# Patient Record
Sex: Female | Born: 1960 | Race: White | Hispanic: No | Marital: Single | Smoking: Former smoker
Health system: Southern US, Community
[De-identification: ages and names within clinical notes are randomized; demographics above are authoritative.]

## PROBLEM LIST (undated history)

## (undated) DIAGNOSIS — F32A Depression, unspecified: Secondary | ICD-10-CM

## (undated) DIAGNOSIS — F419 Anxiety disorder, unspecified: Secondary | ICD-10-CM

## (undated) DIAGNOSIS — A4902 Methicillin resistant Staphylococcus aureus infection, unspecified site: Secondary | ICD-10-CM

## (undated) DIAGNOSIS — K219 Gastro-esophageal reflux disease without esophagitis: Secondary | ICD-10-CM

## (undated) DIAGNOSIS — I2510 Atherosclerotic heart disease of native coronary artery without angina pectoris: Secondary | ICD-10-CM

## (undated) DIAGNOSIS — C539 Malignant neoplasm of cervix uteri, unspecified: Secondary | ICD-10-CM

## (undated) DIAGNOSIS — I214 Non-ST elevation (NSTEMI) myocardial infarction: Secondary | ICD-10-CM

## (undated) DIAGNOSIS — J189 Pneumonia, unspecified organism: Secondary | ICD-10-CM

## (undated) DIAGNOSIS — R519 Headache, unspecified: Secondary | ICD-10-CM

## (undated) DIAGNOSIS — I219 Acute myocardial infarction, unspecified: Secondary | ICD-10-CM

## (undated) DIAGNOSIS — IMO0002 Reserved for concepts with insufficient information to code with codable children: Secondary | ICD-10-CM

## (undated) DIAGNOSIS — R011 Cardiac murmur, unspecified: Secondary | ICD-10-CM

## (undated) HISTORY — PX: APPENDECTOMY: SHX54

## (undated) HISTORY — PX: TUBAL LIGATION: SHX77

## (undated) HISTORY — PX: WISDOM TOOTH EXTRACTION: SHX21

## (undated) HISTORY — PX: CERVICAL BIOPSY: SHX590

## (undated) HISTORY — DX: Atherosclerotic heart disease of native coronary artery without angina pectoris: I25.10

---

## 2001-06-24 DIAGNOSIS — I2510 Atherosclerotic heart disease of native coronary artery without angina pectoris: Secondary | ICD-10-CM | POA: Diagnosis present

## 2001-09-05 ENCOUNTER — Inpatient Hospital Stay (HOSPITAL_COMMUNITY): Admission: EM | Admit: 2001-09-05 | Discharge: 2001-09-09 | Payer: Self-pay | Admitting: Psychiatry

## 2011-06-24 ENCOUNTER — Inpatient Hospital Stay (HOSPITAL_COMMUNITY)
Admission: EM | Admit: 2011-06-24 | Discharge: 2011-06-26 | DRG: 247 | Disposition: A | Discharge status: Home / Self Care | Payer: 59 | Attending: Cardiology | Admitting: Cardiology

## 2011-06-24 ENCOUNTER — Encounter (HOSPITAL_COMMUNITY): Payer: Self-pay | Admitting: Emergency Medicine

## 2011-06-24 ENCOUNTER — Ambulatory Visit (HOSPITAL_COMMUNITY): Admit: 2011-06-24 | Payer: Self-pay | Admitting: Interventional Cardiology

## 2011-06-24 ENCOUNTER — Encounter (HOSPITAL_COMMUNITY)
Admission: EM | Disposition: A | Discharge status: Home / Self Care | Payer: Self-pay | Source: Home / Self Care | Attending: Cardiology

## 2011-06-24 ENCOUNTER — Emergency Department (HOSPITAL_COMMUNITY): Payer: Self-pay

## 2011-06-24 DIAGNOSIS — Z9089 Acquired absence of other organs: Secondary | ICD-10-CM

## 2011-06-24 DIAGNOSIS — Z90710 Acquired absence of both cervix and uterus: Secondary | ICD-10-CM

## 2011-06-24 DIAGNOSIS — I2510 Atherosclerotic heart disease of native coronary artery without angina pectoris: Secondary | ICD-10-CM | POA: Diagnosis present

## 2011-06-24 DIAGNOSIS — I214 Non-ST elevation (NSTEMI) myocardial infarction: Principal | ICD-10-CM | POA: Diagnosis present

## 2011-06-24 DIAGNOSIS — Z7982 Long term (current) use of aspirin: Secondary | ICD-10-CM

## 2011-06-24 DIAGNOSIS — I252 Old myocardial infarction: Secondary | ICD-10-CM

## 2011-06-24 DIAGNOSIS — Z8541 Personal history of malignant neoplasm of cervix uteri: Secondary | ICD-10-CM

## 2011-06-24 DIAGNOSIS — Z79899 Other long term (current) drug therapy: Secondary | ICD-10-CM

## 2011-06-24 DIAGNOSIS — Z9851 Tubal ligation status: Secondary | ICD-10-CM

## 2011-06-24 DIAGNOSIS — E785 Hyperlipidemia, unspecified: Secondary | ICD-10-CM | POA: Diagnosis present

## 2011-06-24 DIAGNOSIS — Z955 Presence of coronary angioplasty implant and graft: Secondary | ICD-10-CM

## 2011-06-24 HISTORY — DX: Reserved for concepts with insufficient information to code with codable children: IMO0002

## 2011-06-24 HISTORY — DX: Non-ST elevation (NSTEMI) myocardial infarction: I21.4

## 2011-06-24 HISTORY — PX: LEFT HEART CATHETERIZATION WITH CORONARY ANGIOGRAM: SHX5451

## 2011-06-24 HISTORY — PX: PERCUTANEOUS CORONARY STENT INTERVENTION (PCI-S): SHX5485

## 2011-06-24 HISTORY — DX: Malignant neoplasm of cervix uteri, unspecified: C53.9

## 2011-06-24 HISTORY — DX: Acute myocardial infarction, unspecified: I21.9

## 2011-06-24 LAB — CARDIAC PANEL(CRET KIN+CKTOT+MB+TROPI)
CK, MB: 25.4 ng/mL (ref 0.3–4.0)
CK, MB: 30 ng/mL (ref 0.3–4.0)
Relative Index: 11.7 — ABNORMAL HIGH (ref 0.0–2.5)
Relative Index: 12.2 — ABNORMAL HIGH (ref 0.0–2.5)
Total CK: 218 U/L — ABNORMAL HIGH (ref 7–177)
Total CK: 245 U/L — ABNORMAL HIGH (ref 7–177)
Troponin I: 2.95 ng/mL (ref <0.30)
Troponin I: 5.79 ng/mL (ref <0.30)

## 2011-06-24 LAB — CBC
HCT: 39.6 % (ref 36.0–46.0)
Hemoglobin: 13.9 g/dL (ref 12.0–15.0)
MCH: 31.4 pg (ref 26.0–34.0)
MCHC: 35.1 g/dL (ref 30.0–36.0)
MCV: 89.4 fL (ref 78.0–100.0)
Platelets: 199 10*3/uL (ref 150–400)
RBC: 4.43 MIL/uL (ref 3.87–5.11)
RDW: 13.7 % (ref 11.5–15.5)
WBC: 4.4 10*3/uL (ref 4.0–10.5)

## 2011-06-24 LAB — PROTIME-INR
INR: 1 (ref 0.00–1.49)
Prothrombin Time: 13.4 seconds (ref 11.6–15.2)

## 2011-06-24 LAB — HEMOGLOBIN A1C
Hgb A1c MFr Bld: 5 % (ref <5.7)
Mean Plasma Glucose: 97 mg/dL (ref <117)

## 2011-06-24 LAB — BASIC METABOLIC PANEL
BUN: 14 mg/dL (ref 6–23)
CO2: 22 mEq/L (ref 19–32)
Calcium: 9.4 mg/dL (ref 8.4–10.5)
Chloride: 105 mEq/L (ref 96–112)
Creatinine, Ser: 0.71 mg/dL (ref 0.50–1.10)
GFR calc Af Amer: >90 mL/min (ref >90)
GFR calc non Af Amer: >90 mL/min (ref >90)
Glucose, Bld: 93 mg/dL (ref 70–99)
Potassium: 3.6 mEq/L (ref 3.5–5.1)
Sodium: 138 mEq/L (ref 135–145)

## 2011-06-24 LAB — MRSA PCR SCREENING: MRSA by PCR: NEGATIVE

## 2011-06-24 LAB — TROPONIN I
Troponin I: <0.3 ng/mL (ref <0.30)
Troponin I: 0.57 ng/mL (ref <0.30)

## 2011-06-24 SURGERY — PERCUTANEOUS CORONARY STENT INTERVENTION (PCI-S)
Laterality: Right

## 2011-06-24 MED ORDER — HEPARIN (PORCINE) IN NACL 2-0.9 UNIT/ML-% IJ SOLN
INTRAMUSCULAR | Status: AC
  Filled 2011-06-24: qty 2000

## 2011-06-24 MED ORDER — METOPROLOL TARTRATE 1 MG/ML IV SOLN
5.0000 mg | Freq: Once | INTRAVENOUS | Status: AC
  Administered 2011-06-24: 5 mg via INTRAVENOUS

## 2011-06-24 MED ORDER — SODIUM CHLORIDE 0.9 % IV SOLN: 250.0000 mL | INTRAVENOUS | Status: DC | PRN

## 2011-06-24 MED ORDER — MIDAZOLAM HCL 2 MG/2ML IJ SOLN
INTRAMUSCULAR | Status: AC
  Filled 2011-06-24: qty 2

## 2011-06-24 MED ORDER — HEPARIN BOLUS VIA INFUSION
4000.0000 [IU] | Freq: Once | INTRAVENOUS | Status: AC
  Administered 2011-06-24: 4000 [IU] via INTRAVENOUS

## 2011-06-24 MED ORDER — ATORVASTATIN CALCIUM 80 MG PO TABS
80.0000 mg | ORAL_TABLET | Freq: Every day | ORAL | Status: DC
  Administered 2011-06-24 – 2011-06-25 (×2): 80 mg via ORAL
  Filled 2011-06-24 (×3): qty 1

## 2011-06-24 MED ORDER — BIVALIRUDIN 250 MG IV SOLR
INTRAVENOUS | Status: AC
  Filled 2011-06-24: qty 250

## 2011-06-24 MED ORDER — HEPARIN (PORCINE) IN NACL 100-0.45 UNIT/ML-% IJ SOLN
950.0000 [IU]/h | INTRAMUSCULAR | Status: DC
  Administered 2011-06-24: 950 [IU]/h via INTRAVENOUS
  Filled 2011-06-24: qty 250

## 2011-06-24 MED ORDER — TICAGRELOR 90 MG PO TABS
ORAL_TABLET | ORAL | Status: AC
  Administered 2011-06-24: 90 mg via ORAL
  Filled 2011-06-24: qty 2

## 2011-06-24 MED ORDER — ONDANSETRON HCL 4 MG/2ML IJ SOLN
4.0000 mg | Freq: Once | INTRAMUSCULAR | Status: AC
  Administered 2011-06-24: 4 mg via INTRAVENOUS
  Filled 2011-06-24: qty 2

## 2011-06-24 MED ORDER — SODIUM CHLORIDE 0.9 % IJ SOLN: 3.0000 mL | INTRAMUSCULAR | Status: DC | PRN

## 2011-06-24 MED ORDER — FENTANYL CITRATE 0.05 MG/ML IJ SOLN
INTRAMUSCULAR | Status: AC
  Filled 2011-06-24: qty 2

## 2011-06-24 MED ORDER — LIDOCAINE HCL (PF) 1 % IJ SOLN
INTRAMUSCULAR | Status: AC
  Filled 2011-06-24: qty 30

## 2011-06-24 MED ORDER — HYDROMORPHONE HCL PF 1 MG/ML IJ SOLN
1.0000 mg | Freq: Once | INTRAMUSCULAR | Status: AC
  Administered 2011-06-24: 1 mg via INTRAVENOUS

## 2011-06-24 MED ORDER — NITROGLYCERIN 0.2 MG/ML ON CALL CATH LAB
INTRAVENOUS | Status: AC
  Filled 2011-06-24: qty 1

## 2011-06-24 MED ORDER — ACETAMINOPHEN 325 MG PO TABS
650.0000 mg | ORAL_TABLET | ORAL | Status: DC | PRN
  Administered 2011-06-25: 650 mg via ORAL
  Filled 2011-06-24: qty 2

## 2011-06-24 MED ORDER — ASPIRIN 81 MG PO CHEW
CHEWABLE_TABLET | ORAL | Status: AC
  Filled 2011-06-24: qty 4

## 2011-06-24 MED ORDER — NITROGLYCERIN IN D5W 200-5 MCG/ML-% IV SOLN
2.0000 ug/min | Freq: Once | INTRAVENOUS | Status: AC
  Administered 2011-06-24: 5 ug/min via INTRAVENOUS

## 2011-06-24 MED ORDER — METOPROLOL TARTRATE 1 MG/ML IV SOLN
INTRAVENOUS | Status: AC
  Filled 2011-06-24: qty 5

## 2011-06-24 MED ORDER — ACETAMINOPHEN 325 MG PO TABS: 650.0000 mg | ORAL_TABLET | ORAL | Status: DC | PRN

## 2011-06-24 MED ORDER — HYDROMORPHONE HCL PF 1 MG/ML IJ SOLN
INTRAMUSCULAR | Status: AC
  Administered 2011-06-24: 1 mg
  Filled 2011-06-24: qty 1

## 2011-06-24 MED ORDER — ONDANSETRON HCL 4 MG/2ML IJ SOLN: 4.0000 mg | Freq: Four times a day (QID) | INTRAMUSCULAR | Status: DC | PRN

## 2011-06-24 MED ORDER — POTASSIUM CHLORIDE 10 MEQ/100ML IV SOLN: 10.0000 meq | INTRAVENOUS | Status: DC

## 2011-06-24 MED ORDER — ASPIRIN EC 81 MG PO TBEC
81.0000 mg | DELAYED_RELEASE_TABLET | Freq: Every day | ORAL | Status: DC
  Administered 2011-06-25 – 2011-06-26 (×2): 81 mg via ORAL
  Filled 2011-06-24 (×2): qty 1

## 2011-06-24 MED ORDER — SODIUM CHLORIDE 0.9 % IJ SOLN
3.0000 mL | Freq: Two times a day (BID) | INTRAMUSCULAR | Status: DC
  Administered 2011-06-24 – 2011-06-26 (×3): 3 mL via INTRAVENOUS

## 2011-06-24 MED ORDER — MORPHINE SULFATE 4 MG/ML IJ SOLN
4.0000 mg | Freq: Once | INTRAMUSCULAR | Status: AC
  Administered 2011-06-24: 4 mg via INTRAVENOUS
  Filled 2011-06-24: qty 1

## 2011-06-24 MED ORDER — ZOLPIDEM TARTRATE 5 MG PO TABS
10.0000 mg | ORAL_TABLET | Freq: Every evening | ORAL | Status: DC | PRN
  Administered 2011-06-24 – 2011-06-25 (×2): 10 mg via ORAL
  Filled 2011-06-24 (×2): qty 2

## 2011-06-24 MED ORDER — MORPHINE SULFATE 2 MG/ML IJ SOLN: 2.0000 mg | Freq: Once | INTRAMUSCULAR | Status: AC

## 2011-06-24 MED ORDER — MORPHINE SULFATE 2 MG/ML IJ SOLN
INTRAMUSCULAR | Status: AC
  Administered 2011-06-24: 2 mg via INTRAVENOUS
  Filled 2011-06-24: qty 1

## 2011-06-24 MED ORDER — ASPIRIN 81 MG PO CHEW: 81.0000 mg | CHEWABLE_TABLET | Freq: Every day | ORAL | Status: DC

## 2011-06-24 MED ORDER — METOPROLOL TARTRATE 12.5 MG HALF TABLET
12.5000 mg | ORAL_TABLET | Freq: Two times a day (BID) | ORAL | Status: DC
  Administered 2011-06-24 – 2011-06-25 (×3): 12.5 mg via ORAL
  Filled 2011-06-24 (×4): qty 1

## 2011-06-24 MED ORDER — TICAGRELOR 90 MG PO TABS
90.0000 mg | ORAL_TABLET | Freq: Two times a day (BID) | ORAL | Status: DC
  Administered 2011-06-24 – 2011-06-26 (×4): 90 mg via ORAL
  Filled 2011-06-24 (×5): qty 1

## 2011-06-24 MED ORDER — ONDANSETRON HCL 4 MG/2ML IJ SOLN
4.0000 mg | Freq: Four times a day (QID) | INTRAMUSCULAR | Status: DC | PRN
  Administered 2011-06-24: 4 mg via INTRAVENOUS
  Filled 2011-06-24: qty 2

## 2011-06-24 MED ORDER — NITROGLYCERIN 0.4 MG SL SUBL
0.4000 mg | SUBLINGUAL_TABLET | SUBLINGUAL | Status: DC | PRN
  Administered 2011-06-24 (×2): 0.4 mg via SUBLINGUAL
  Filled 2011-06-24 (×2): qty 25

## 2011-06-24 MED ORDER — SODIUM CHLORIDE 0.9 % IV SOLN
1.0000 mL/kg/h | INTRAVENOUS | Status: AC
  Administered 2011-06-24: 1 mL/kg/h via INTRAVENOUS

## 2011-06-24 NOTE — ED Notes (Signed)
Lab bedside to obtain sample

## 2011-06-24 NOTE — ED Provider Notes (Signed)
History     CSN: [id number]  Arrival date & time 06/24/11  0235   First MD Initiated Contact with Patient 06/24/11 [phone]      Chief Complaint  Patient presents with  . Chest Pain    (Consider location/radiation/quality/duration/timing/severity/associated sxs/prior treatment) HPI The patient is a 51 year old female who presents today complaining of intermittent episodes of substernal chest pressure radiating to her neck and shoulders bilaterally that began 24 hours ago. Patient was sitting still and this began. This is similar to when she had a prior heart attack per her report. Patient reports having a myocardial infarction 10 years ago. No stenting was performed at that time. Patient had some old nitroglycerin at home and took this prior to arrival. She had slight improvement in her symptoms. Patient also took full-strength aspirin at 2 PM today prior to arrival. Patient denies any history of DVT or PE personally. She has no history of hypertension, hypercholesterolemia, or diabetes. Patient has not a smoker. She does have strong family history with a mother with several early heart attacks. Patient has not had a stress test or any further evaluation by a cardiologist since her prior MI. Patient does not currently have a very care physician. She denies any nausea or diaphoresis. Patient has no abdominal pain, cough, fevers, or other associated symptoms. There are no other associated or modifying factors. Past Medical History  Diagnosis Date  . Myocardial infarction   . High cholesterol   . Degenerative disc disease   . Cancer   . Cervical cancer     Past Surgical History  Procedure Date  . Appendectomy   . Tubal ligation     Family History  Problem Relation Age of Onset  . Coronary artery disease Other   . Hyperlipidemia Other   . Diabetes Other   . Cancer Other     History  Substance Use Topics  . Smoking status: Never Smoker   . Smokeless tobacco: Not on file  . Alcohol  Use: Yes    OB History    Grav Para Term Preterm Abortions TAB SAB Ect Mult Living                  Review of Systems  Constitutional: Negative.   HENT: Negative.   Eyes: Negative.   Respiratory: Negative.   Cardiovascular: Positive for chest pain.  Gastrointestinal: Negative.   Genitourinary: Negative.   Musculoskeletal: Negative.   Skin: Negative.   Neurological: Negative.   Hematological: Negative.   Psychiatric/Behavioral: Negative.   All other systems reviewed and are negative.    Allergies  Review of patient's allergies indicates no known allergies.  Home Medications   Current Outpatient Rx  Name Route Sig Dispense Refill  . ASPIRIN 325 MG PO TABS Oral Take 325 mg by mouth once.    Marland Kitchen FEXOFENADINE HCL 180 MG PO TABS Oral Take 180 mg by mouth daily.    . IBUPROFEN 200 MG PO TABS Oral Take 800 mg by mouth every 6 (six) hours as needed. For pain    . NITROGLYCERIN 0.4 MG SL SUBL Sublingual Place 0.4 mg under the tongue every 5 (five) minutes as needed.    Marland Kitchen OVER THE COUNTER MEDICATION  2 tablets. Herbal supplement for menopause    . OVER THE COUNTER MEDICATION Oral Take 2 tablets by mouth daily. Herbal supplement for nerves    . ST JOHNS WORT PO Oral Take 2 tablets by mouth daily.      BP 113/79  Pulse 77  Temp(Src) 98.4 F (36.9 C) (Oral)  Resp 12  Wt 170 lb (77.111 kg)  SpO2 99%  LMP 06/03/2011  Physical Exam  Nursing note and vitals reviewed. GEN: Well-developed, well-nourished female in no distress HEENT: Atraumatic, normocephalic. Oropharynx clear without erythema EYES: PERRLA BL, no scleral icterus. NECK: Trachea midline, no meningismus CV: regular rate and rhythm. No murmurs, rubs, or gallops PULM: No respiratory distress.  No crackles, wheezes, or rales. GI: soft, non-tender. No guarding, rebound, or tenderness. + bowel sounds  GU: deferred Neuro: cranial nerves 2-12 intact, no abnormalities of strength or sensation, A and O x 3 MSK: Patient  moves all 4 extremities symmetrically, no deformity, edema, or injury noted Skin: No rashes petechiae, purpura, or jaundice Psych: no abnormality of mood   ED Course  Procedures (including critical care time)   Date: 06/24/2011  Rate: 91  Rhythm: normal sinus rhythm  QRS Axis: normal  Intervals: normal  ST/T Wave abnormalities: normal  Conduction Disutrbances:none  Narrative Interpretation:   Old EKG Reviewed: none available   Date: 06/24/2011 #2  Rate: 77  Rhythm: normal sinus rhythm  QRS Axis: normal  Intervals: normal  ST/T Wave abnormalities: normal  Conduction Disutrbances:none  Narrative Interpretation:   Old EKG Reviewed: unchanged   Date: 06/24/2011  Rate: 96  Rhythm: normal sinus rhythm  QRS Axis: normal  Intervals: normal  ST/T Wave abnormalities: normal  Conduction Disutrbances:none  Narrative Interpretation:   Old EKG Reviewed: unchanged    Labs Reviewed  TROPONIN I - Abnormal; Notable for the following:    Troponin I 0.57 (*)    All other components within normal limits  CBC  BASIC METABOLIC PANEL  TROPONIN I   Dg Chest 2 View  06/24/2011  *RADIOLOGY REPORT*  Clinical Data: Chest pain  CHEST - 2 VIEW  Comparison: None.  Findings: Lungs are clear. No pleural effusion or pneumothorax. The cardiomediastinal contours are within normal limits. The visualized bones and soft tissues are without significant appreciable abnormality.  IMPRESSION: No radiographic evidence for acute cardiopulmonary process.  Original Report Authenticated By: Waneta Martins, M.D.     1. NSTEMI, initial episode of care       MDM  Patient was evaluated by myself. She received nitroglycerin for her pain and had some improvement. Patient also received morphine which helped as well. Patient had initial EKG with no acute ischemic changes. This was repeated after a possible run of 7 beats of V. tach was witnessed by nursing. EKG was unchanged. Laboratory workup was unremarkable  including negative troponin. At 3 hours repeat troponin was 0.57. EKG was repeated again. There were still no changes noted. Order was placed for nitro drip as well as heparin. Consult was placed to cardiology.  Patient was discussed with Dr. Dietrich Pates. She'll be evaluated by cardiology here for further management.        Cyndra Numbers, MD 06/24/11 [phone]

## 2011-06-24 NOTE — Progress Notes (Signed)
ANTICOAGULATION CONSULT NOTE - Initial Consult  Pharmacy Consult for IV Heparin Indication: chest pain/ACS  No Known Allergies  Patient Measurements: Height: 5\' 2"  (157.5 cm) Weight: 169 lb 15.6 oz (77.1 kg) IBW/kg (Calculated) : 50.1    Vital Signs: Temp: 98.4 F (36.9 C) (05/18 0239) Temp src: Oral (05/18 0239) BP: 121/79 mmHg (05/18 0700) Pulse Rate: 78  (05/18 0700)  Labs:  Basename 06/24/11 0613 06/24/11 0335  HGB -- 13.9  HCT -- 39.6  PLT -- 199  APTT -- --  LABPROT -- --  INR -- --  HEPARINUNFRC -- --  CREATININE -- 0.71  CKTOTAL -- --  CKMB -- --  TROPONINI 0.57* <0.30    Estimated Creatinine Clearance: 82.7 ml/min (by C-G formula based on Cr of 0.71).   Medical History: Past Medical History  Diagnosis Date  . Myocardial infarction   . High cholesterol   . Degenerative disc disease   . Cancer   . Cervical cancer     Medications:  Scheduled:    . heparin  4,000 Units Intravenous Once  .  morphine injection  4 mg Intravenous Once  . nitroGLYCERIN  2-200 mcg/min Intravenous Once  . ondansetron  4 mg Intravenous Once   Infusions:    . heparin     PRN: nitroGLYCERIN  Assessment: 51 yo F with hx MI admitted w/ CP and elevated troponin to start IV Heparin for STEMI.   Goal of Therapy:  Heparin level 0.3-0.7 units/ml Monitor platelets by anticoagulation protocol: Yes   Plan:  1.) Draw Baseline PT/INR, after drawn may start IV heparin 2.) Heparin 4000 units IV bolus x 1 now 3.) Heparin 950 units/hr (9.5 ml/hr) 4.) Daily Heparin level/CBC 5.) 1st heparin level at 1400 (6 hr after start of gtt)  Tiwan Schnitker, Loma Messing PharmD 7:38 AM 06/24/2011

## 2011-06-24 NOTE — ED Notes (Signed)
Pt alert, family bedside, resp even unlabored, monitor alarm, pt has 3 sec run of V tach on monitor, states pain now 7/10, MD aware, orders obtained, cont to monitor

## 2011-06-24 NOTE — H&P (Signed)
History and Physical  Patient ID: Maria Bishop MRN: [id number], SOB: Jan 03, 1961 51 y.o. Date of Encounter: 06/24/2011, 8:46 AM  Primary Physician: None Primary Cardiologist: None  Chief Complaint: Chest Pain  HPI: 51 y.o. female w/ PMHx significant for MI 101yrs ago (no PCI) who presented to [facility] on 06/24/11 with complaints of chest pain and was transferred to [facility] with NSTEMI.  She reported having a MI 62yrs ago in [place], [place] where cath showed "occlusion in small vessel in the back of the heart", no stenting. No further cardiac events until yesterday after putting groceries away she had sudden onset of substernal chest pressure radiating to her neck and bilateral shoulders that was intermittent and then woke her from sleep this morning prompting her to go to the ED. The pain is similar to the pain she had prior to MI. She took old prescription for NTG and full strength ASA with some relief of chest pain. Denies tobacco abuse or history of HTN, HLD, or DM. She reports a strong family history of heart disease with a mother who had heart attacks at a young age.  In the ED, EKG revealed NSR without acute ST/T changes. CXR was without acute cardiopulmonary abnormalities. Initial troponin was normal with subsequent one elevated at 0.57 after 7 beats of VTach on telemetry was noted. She received morphine and NTG with some relief in pain. She received IV Lopressor for elevated heart rates. She was placed on IV NTG and IV Heparin and transported to [facility] cath lab where she continued to have chest pain.  Past Medical History  Diagnosis Date  . Myocardial infarction     ~80yrs ago  . Degenerative disc disease   . Cervical cancer      Surgical History:  Procedure  . Appendectomy  . Tubal ligation     Home Meds: Medication Sig  aspirin 325 MG tablet Take 325 mg by mouth once.  fexofenadine (ALLEGRA) 180 MG tablet Take 180 mg by mouth daily.  ibuprofen  (ADVIL,MOTRIN) 200 MG tablet Take 800 mg by mouth every 6 (six) hours as needed. For pain  nitroGLYCERIN (NITROSTAT) 0.4 MG SL tablet Place 0.4 mg under the tongue every 5 (five) minutes as needed.  OVER THE COUNTER MEDICATION 2 tablets. Herbal supplement for menopause  OVER THE COUNTER MEDICATION Take 2 tablets by mouth daily. Herbal supplement for nerves  ST JOHNS WORT PO Take 2 tablets by mouth daily.    Allergies: No Known Allergies  Social History  . Marital Status: Married   Social History Main Topics  . Smoking status: Never Smoker   . Alcohol Use: Yes  . Drug Use: No   Family History  Problem Relation Age of Onset  . Coronary artery disease Mother     MI in her 32s  . Hyperlipidemia Other   . Diabetes Other   . Cancer Other     Review of Systems: General: negative for chills, fever, night sweats or weight changes.  Cardiovascular: (+) chest pain; negative for shortness of breath, dyspnea on exertion, edema, orthopnea, palpitations, paroxysmal nocturnal dyspnea  Dermatological: negative for rash Respiratory: negative for cough or wheezing Urologic: negative for hematuria Abdominal: negative for nausea, vomiting, diarrhea, bright red blood per rectum, melena, or hematemesis Neurologic: negative for visual changes, syncope, or dizziness All other systems reviewed and are otherwise negative except as noted above.  Labs:   Component Value Date   WBC 4.4 06/24/2011   HGB 13.9  06/24/2011   HCT 39.6 06/24/2011   MCV 89.4 06/24/2011   PLT 199 06/24/2011    Lab 06/24/11 0335  NA 138  K 3.6  CL 105  CO2 22  BUN 14  CREATININE 0.71  CALCIUM 9.4  GLUCOSE 93   Basename 06/24/11 0613 06/24/11 0335  TROPONINI 0.57* <0.30      06/24/2011 07:48  Prothrombin Time 13.4  INR 1.00   Radiology/Studies:   06/24/2011 - CXR  Findings: Lungs are clear. No pleural effusion or pneumothorax. The cardiomediastinal contours are within normal limits. The visualized bones and soft  tissues are without significant appreciable abnormality.  IMPRESSION: No radiographic evidence for acute cardiopulmonary process.      EKG: 06/24/11 @ 0237 - NSR 91 bpm TWI V1  Physical Exam: Blood pressure 118/81, pulse 115, temperature 98.4 F (36.9 C), temperature source Oral, resp. rate 18, height 5\' 2"  (1.575 m), weight 169 lb 15.6 oz (77.1 kg), last menstrual period 06/03/2011, SpO2 99.00%. General: Well developed, well nourished, middle aged white female in no acute distress. Head: Normocephalic, atraumatic, sclera non-icteric, nares are without discharge Neck: Supple. Negative for carotid bruits. JVD not elevated. Lungs: Clear bilaterally to auscultation without wheezes, rales, or rhonchi. Breathing is unlabored. Heart: RRR with S1 S2. No murmurs, rubs, or gallops appreciated. Abdomen: Soft, non-tender, non-distended with normoactive bowel sounds. No rebound/guarding. No obvious abdominal masses. Msk:  Strength and tone appear normal for age. Extremities: No edema. No clubbing or cyanosis. Distal pedal pulses are 2+ and equal bilaterally. Neuro: Alert and oriented X 3. Moves all extremities spontaneously. Psych:  Responds to questions appropriately with a normal affect.    ASSESSMENT AND PLAN:  51 y.o. female w/ PMHx significant for MI 40yrs ago (no PCI) who presented to [facility] on 06/24/11 with complaints of chest pain and was transferred to [facility] with NSTEMI.  1. NSTEMI: Patient has h/o MI 46yrs ago (without PCI) and otherwise no cardiac risk factors other than family history premature heart disease. She now presents with ongoing chest pain and positive troponins. Currently undergoing cardiac catheterization. Will place on statin and BB and check lipid panel and A1C for risk stratification. Further plans pending cath results.  Signed, HOPE, JESSICA PA-C 06/24/2011, 8:46 AM  I have taken a history, reviewed medications, allergies, PMH, SH, FH, and  reviewed ROS and examined the patient.  I agree with the assessment and plan.I initiated Code STEMI in [facility] ED secondary to ongoing pain and positive markers. EKG without acute changes so concerned about posterior infarct.  Quasim Doyon C. Daleen Squibb, MD, [facility] Pager:  [phone]

## 2011-06-24 NOTE — ED Notes (Signed)
Pt transferred per [facility] to [facility].

## 2011-06-24 NOTE — ED Notes (Signed)
Pt states she did take a full strength 325mg  ASA pta

## 2011-06-24 NOTE — CV Procedure (Signed)
PROCEDURE:  Left heart catheterization with selective coronary angiography, left ventriculogram.  PCI OM3.  INDICATIONS:  Non-STEMI with refractory chest pain.  Emergency consent.  The risks, benefits, and details of the procedure were explained to the patient.  The patient verbalized understanding and wanted to proceed.  Informed written consent was obtained.  PROCEDURE TECHNIQUE:  After Xylocaine anesthesia a 9F sheath was placed in the right femoral artery with a single anterior needle wall stick.   Left coronary angiography was done using a Judkins L4 guide catheter.  Right coronary angiography was done using a Judkins R4 guide catheter.  Left ventriculography was done using a pigtail catheter.  Intervention was then performed.   CONTRAST:  Total of 200  cc.  COMPLICATIONS:  None.    HEMODYNAMICS:  Aortic pressure was 113/78; LV pressure was 119/2 ; LVEDP 12.  There was no gradient between the left ventricle and aorta.    ANGIOGRAPHIC DATA:   The left main coronary artery is a large vessel which is angiographically normal.  The left anterior descending artery is a large vessel which wraps around the apex.  It appears angiographically normal.  The distal vessel is small in caliber.  There are several very small diagonals which are patent.  The left circumflex artery is is a large codominant vessel which proximally, appears angiographically normal.  There is a medium-sized OM 1 and OM 2 which are widely patent.  The next significant obtuse marginal has a appearance of a ruptured plaque /dissection in the mid to distal portion.  The narrowing extends proximally to the ostium of the vessel.  There is some mild narrowing in the mid circumflex just before this obtuse marginal 3 originates.  There is a small OM for branch which has some mild disease but is patent.  The right coronary artery is a large dominant vessel.  It appears angiographic normal.  The PDA is medium sized.  LEFT  VENTRICULOGRAM:  Left ventricular angiogram was done in the 30 RAO projection and revealed normal left ventricular wall motion and systolic function with an estimated ejection fraction of 55 %.  LVEDP was 12 mmHg.  PCI NARRATIVE:  A CLS 3.5 guiding catheters used to engage the left main.  A pro-water wire was used to cross the long area of disease.  Angiomax used for anticoagulation.  An ACT was used to confirm that the Angiomax is therapeutic.  Initially, a 2.0 x 20 emerge balloon was used to predilate the long area of disease in the OM 3.  A 2.25 x 32 Promus elements stent was advanced to the OM 3 and deployed at 12 atmospheres.  There appeared to be a distal edge dissection.  This was treated with a 2.25 x 8 Promus element stent deployed in an overlapping fashion.  The overlap of the stented area was postdilated with the stent balloon.  Further examination of the angiograms revealed that the initial dissection extended into the mid circumflex.  This is a much larger caliber vessel.  A 3.0 x 16 Promus element stent was then deployed at 14 atmospheres overlapping the proximal edge of the long 2.25 stent.  After stent deployment, the OM 3 increased significantly in size with resolution of the intramural hematoma.  The 3.0 stent balloon was then used to post dilate the entire OM 3 stented area.  A 3.5 x 12 [place] Quantum apex balloon was then used to post dilate the most proximal stent inflated to 16 atmospheres and then to 12  atmospheres.  There is an excellent angiographic result.  There is no residual dissection.  TIMI-3 flow was restored throughout.  IMPRESSIONS:  1. Normal left main coronary artery. 2. Normal left anterior descending artery and its branches. 3. 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  This entire area was stented with overlapping drug-eluting stents.  The distal most area was postdilated to 3.0 m in diameter.  The more  proximal area was postdilated to 3.6 mm in diameter. 4. Normal right coronary artery. 5. Normal left ventricular systolic function.  LVEDP 12 mmHg.  Ejection fraction 55 %.  RECOMMENDATION:  The patient is a long stented area now in her OM 3 and mid circumflex.  She needs to continue dual antiplatelet therapy for at least a year.  After talking to the patient, she reports a similar episode to this about 10 years ago when she was in [place], [place].  She was told that an artery in the back side of her heart towards the bottom had "deteriorated."  It was a small artery and was not fixed with any kind of balloon or stent.  The artery fixed today had a similar appearance in the same location.  We chose to treat this area with stents due to the severe stenosis at a focal dissection flap and another area proximal.  She also had several episodes of recurrent, severe pain.  The concern if medical treatment was used, was whether the dissection would continue to propagate.  Disease, which was likely intramural hematoma, appeared to start in the distal OM 3 and propagated back into the mid circumflex.  There is a large margin of normal vessel that was stented in the mid circumflex to avoid any further propagation of intramural hematoma or dissection flap.  The patient has a significant family history of coronary artery disease.  There was some atherosclerotic material noted on catheter exchanges.  It is very difficult to know whether there was a focus of atherosclerosis that caused the dissection.  Regardless, she needs aggressive secondary prevention including avoiding tobacco, lipid-lowering therapy and beta blockade if blood pressure will allow.

## 2011-06-24 NOTE — ED Notes (Signed)
Pt states pain has unchanged, c/o nausea, skin pwd, resp even unlabored, MD made aware, cont to monitor

## 2011-06-24 NOTE — ED Notes (Signed)
Dr Daleen Squibb at bedside. [facility] called per Dr Daleen Squibb to go straight to cath lab. PT informed and agreeable. Unable to locate family.

## 2011-06-24 NOTE — ED Notes (Signed)
Pt alert, c/o mid sternal chest pain, radiated to throat, describes as pressure, onset was 12hrs ago, pain relieved with nitro, pain returns around 200 last pm, relieved by nitro, resp even unlabored, skin pwd,

## 2011-06-24 NOTE — ED Notes (Signed)
Pt states today she has had two episodes of chest pain  Pt states she used NTG and the pain subsided  Pt states the pain is in the middle of her chest and radiates down both arms and up into her neck  Pt denies any sxs associated with the pain but states she has had a cough

## 2011-06-24 NOTE — ED Notes (Signed)
Pt states she feels better after first nitro, requesting not to take a second one now, MD made aware

## 2011-06-24 NOTE — ED Notes (Signed)
Pt's family located and informed of transfer.

## 2011-06-24 NOTE — ED Notes (Signed)
First contact with pt. Pt c/o 7/10 cp denies sob or nausea. Pt states an elephant sitting on my chest. MD aware of pain and positive troponin.

## 2011-06-24 NOTE — Clinical Documentation Improvement (Signed)
CRITICAL VALUE ALERT  Critical value received:  Ckmb25.4 trop 2.95  Date of notification:  06/24/2011   Time of notification: 1:15 PM    Critical value read back:yes  Nurse who received alert:  Marcy Salvo  MD notified (1st page):  Shanda Bumps hope pa  Time of first page:  [phone]  MD notified (2nd page):  Time of second page:  Responding MD:  Shanda Bumps hope pa  Time MD responded:  [phone]

## 2011-06-24 NOTE — ED Notes (Signed)
[facility] at bedside. Pt transfer emergent to [facility] Cath lab. [facility] given 5mg  Lopressor and 4 baby Asprin per Dr Daleen Squibb verbal order to be given in route. Pt alertx3 respirations easy non labored.

## 2011-06-25 ENCOUNTER — Encounter (HOSPITAL_COMMUNITY): Payer: Self-pay | Admitting: Internal Medicine

## 2011-06-25 DIAGNOSIS — M47816 Spondylosis without myelopathy or radiculopathy, lumbar region: Secondary | ICD-10-CM | POA: Insufficient documentation

## 2011-06-25 DIAGNOSIS — C539 Malignant neoplasm of cervix uteri, unspecified: Secondary | ICD-10-CM | POA: Insufficient documentation

## 2011-06-25 LAB — LIPID PANEL
Cholesterol: 134 mg/dL (ref 0–200)
HDL: 38 mg/dL — ABNORMAL LOW (ref >39)
LDL Cholesterol: 83 mg/dL (ref 0–99)
Total CHOL/HDL Ratio: 3.5 RATIO
Triglycerides: 66 mg/dL (ref <150)
VLDL: 13 mg/dL (ref 0–40)

## 2011-06-25 LAB — CARDIAC PANEL(CRET KIN+CKTOT+MB+TROPI)
CK, MB: 24.6 ng/mL (ref 0.3–4.0)
CK, MB: 15.3 ng/mL (ref 0.3–4.0)
CK, MB: 8.6 ng/mL (ref 0.3–4.0)
Relative Index: 10.8 — ABNORMAL HIGH (ref 0.0–2.5)
Relative Index: 8.6 — ABNORMAL HIGH (ref 0.0–2.5)
Relative Index: 6.2 — ABNORMAL HIGH (ref 0.0–2.5)
Total CK: 228 U/L — ABNORMAL HIGH (ref 7–177)
Total CK: 178 U/L — ABNORMAL HIGH (ref 7–177)
Total CK: 138 U/L (ref 7–177)
Troponin I: 7.54 ng/mL (ref <0.30)
Troponin I: 8.84 ng/mL (ref <0.30)
Troponin I: 7.4 ng/mL (ref <0.30)

## 2011-06-25 LAB — CBC
HCT: 36.6 % (ref 36.0–46.0)
Hemoglobin: 12.7 g/dL (ref 12.0–15.0)
MCH: 31 pg (ref 26.0–34.0)
MCHC: 34.7 g/dL (ref 30.0–36.0)
MCV: 89.3 fL (ref 78.0–100.0)
Platelets: 167 10*3/uL (ref 150–400)
RBC: 4.1 MIL/uL (ref 3.87–5.11)
RDW: 13.8 % (ref 11.5–15.5)
WBC: 6.1 10*3/uL (ref 4.0–10.5)

## 2011-06-25 LAB — TSH
TSH: 2.762 u[IU]/mL (ref 0.350–4.500)
TSH: 1.616 u[IU]/mL (ref 0.350–4.500)

## 2011-06-25 LAB — COMPREHENSIVE METABOLIC PANEL
ALT: 12 U/L (ref 0–35)
AST: 29 U/L (ref 0–37)
Albumin: 3.7 g/dL (ref 3.5–5.2)
Alkaline Phosphatase: 44 U/L (ref 39–117)
BUN: 6 mg/dL (ref 6–23)
CO2: 21 mEq/L (ref 19–32)
Calcium: 8.9 mg/dL (ref 8.4–10.5)
Chloride: 104 mEq/L (ref 96–112)
Creatinine, Ser: 0.56 mg/dL (ref 0.50–1.10)
GFR calc Af Amer: >90 mL/min (ref >90)
GFR calc non Af Amer: >90 mL/min (ref >90)
Glucose, Bld: 93 mg/dL (ref 70–99)
Potassium: 3.5 mEq/L (ref 3.5–5.1)
Sodium: 138 mEq/L (ref 135–145)
Total Bilirubin: 0.9 mg/dL (ref 0.3–1.2)
Total Protein: 6.1 g/dL (ref 6.0–8.3)

## 2011-06-25 MED ORDER — METOPROLOL TARTRATE 25 MG PO TABS
25.0000 mg | ORAL_TABLET | Freq: Two times a day (BID) | ORAL | Status: DC
  Administered 2011-06-25 – 2011-06-26 (×2): 25 mg via ORAL
  Filled 2011-06-25 (×3): qty 1

## 2011-06-25 NOTE — Progress Notes (Signed)
Report was called to RN on unit 3700. Pt will transfer to room 3733.

## 2011-06-25 NOTE — Progress Notes (Signed)
Maria Bishop  51 y.o.  female  Subjective: No chest discomfort nor dyspnea since intervention performed.  Stable hemodynamic Status. No arrhythmias.  Allergy: Review of patient's allergies indicates no known allergies.  Objective: Vital signs in last 24 hours: Temp:  [97.9 F (36.6 C)-98.9 F (37.2 C)] 98.9 F (37.2 C) (05/19 1152) Pulse Rate:  [67-98] 77  (05/19 0600) Resp:  [10-18] 11  (05/19 0600) BP: (106-135)/(66-94) 113/73 mmHg (05/19 1152) SpO2:  [96 %-100 %] 99 % (05/19 1152)  77.1 kg (169 lb 15.6 oz) Body mass index is 31.09 kg/(m^2).  Weight change: -0.012 kg (-0.4 oz) Last BM Date: 06/24/11  Intake/Output from previous day: 05/18 0701 - 05/19 0700 In: 1384.6 [P.O.:920; I.V.:462.6; IV Piggyback:2] Out: 3550 [Urine:3550]  General- Well developed; no acute distress; mildly overweight Neck- No JVD, no carotid bruits Lungs- clear lung fields; normal I:E ratio Cardiovascular- normal PMI; normal S1 and S2; minimal systolic murmur Abdomen- normal bowel sounds; soft and non-tender without masses or organomegaly Skin- Warm, no significant lesions Extremities- Nl distal pulses; no edema  Lab Results: Cardiac Markers:   Basename 06/24/11 2349 06/24/11 1600  TROPONINI 7.54* 5.79*   CBC:   Basename 06/25/11 0612 06/24/11 0335  WBC 6.1 4.4  HGB 12.7 13.9  HCT 36.6 39.6  PLT 167 199   BMET:  Basename 06/25/11 0612 06/24/11 0335  NA 138 138  K 3.5 3.6  CL 104 105  CO2 21 22  GLUCOSE 93 93  BUN 6 14  CREATININE 0.56 0.71  CALCIUM 8.9 9.4   Hepatic Function:   Basename 06/25/11 0612  PROT 6.1  ALBUMIN 3.7  AST 29  ALT 12  ALKPHOS 44  BILITOT 0.9  BILIDIR --  IBILI --   GFR:  Estimated Creatinine Clearance: 82.7 ml/min (by C-G formula based on Cr of 0.56). Lipids:   Basename 06/25/11 0612  CHOL 134  TRIG 66  HDL 38*   Imaging Studies/Results: Dg Chest 2 View  06/24/2011  *RADIOLOGY REPORT*  Clinical Data: Chest pain  CHEST - 2 VIEW  Comparison:  None.  Findings: Lungs are clear. No pleural effusion or pneumothorax. The cardiomediastinal contours are within normal limits. The visualized bones and soft tissues are without significant appreciable abnormality.  IMPRESSION: No radiographic evidence for acute cardiopulmonary process.  Original Report Authenticated By: Waneta Martins, M.D.    Imaging: Imaging results have been reviewed  Medications: I have reviewed the patient's current medications.  Principal Problem:  *NSTEMI (non-ST elevated myocardial infarction) Active Problems:  CAD (coronary artery disease)   Assessment/Plan: Patient is doing extremely well following a minimal infarction in the distribution of the circumflex coronary artery. This is a second event, possibly in the same vessel. Prior record should be sought from our office and reviewed. Other vessels show no evidence for coronary disease indicating that this may be a non-atheromatous event, perhaps a dissection. Treatment with aspirin, ticagrelor, beta blocker and statin will be continued. I anticipate discharge in the morning. She will be transferred to telemetry.  LOS: 1 day   [place] Bing 06/25/2011, 11:54 AM

## 2011-06-25 NOTE — Progress Notes (Signed)
[facility] Resident Note  Subjective:  Noted that she had one episode of chest pain last night but since then is chest pain free. Denies any SOB, nausea, abdominal pain.   Objective:  Vital Signs in the last 24 hours: Filed Vitals:   06/25/11 0500 06/25/11 0520 06/25/11 0600 06/25/11 0800  BP:  112/70  111/73  Pulse: 85 97 77   Temp:    98.6 F (37 C)  TempSrc:    Oral  Resp: 13 18 11    Height:      Weight:      SpO2: 97% 98% 97% 99%    Intake/Output from previous day: 05/18 0701 - 05/19 0700 In: 1384.6 [P.O.:920; I.V.:462.6; IV Piggyback:2] Out: 3550 [Urine:3550]  24-hour weight change: Weight change: -0.4 oz (-0.012 kg)  Weight trends: [employer]   06/24/11 0239 06/24/11 0722  Weight: 170 lb (77.111 kg) 169 lb 15.6 oz (77.1 kg)    Physical Exam: Pt is alert and oriented, NAD HEENT: normal Neck: JVP - normal, carotids 2+ equal without bruits Lungs: CTA bilaterally CV: RRR without murmur or gallop Abd: soft, NT, Positive BS, no hepatomegaly Ext: no C/C/E, distal pulses intact and equal Skin: warm/dry no rash  Lab Results:  Basename 06/25/11 0612 06/24/11 0335  WBC 6.1 4.4  HGB 12.7 13.9  PLT 167 199    Basename 06/25/11 0612 06/24/11 0335  NA 138 138  K 3.5 3.6  CL 104 105  CO2 21 22  GLUCOSE 93 93  BUN 6 14  CREATININE 0.56 0.71    Basename 06/24/11 2349 06/24/11 1600  TROPONINI 7.54* 5.79*    Scheduled Meds:   . aspirin      . aspirin EC  81 mg Oral Daily  . atorvastatin  80 mg Oral q1800  . HYDROmorphone      .  HYDROmorphone (DILAUDID) injection  1 mg Intravenous Once  . metoprolol      . metoprolol      . metoprolol tartrate  12.5 mg Oral BID  .  morphine injection  2 mg Intravenous Once  . morphine      . sodium chloride  3 mL Intravenous Q12H  . Ticagrelor  90 mg Oral BID  . DISCONTD: aspirin  81 mg Oral Daily  . DISCONTD: potassium chloride  10 mEq Intravenous Q1 Hr x 4   Continuous Infusions:   .  sodium chloride Stopped (06/24/11 1825)  . DISCONTD: heparin 950 Units/hr (06/24/11 0756)   PRN Meds:.sodium chloride, acetaminophen, nitroGLYCERIN, ondansetron (ZOFRAN) IV, sodium chloride, zolpidem, DISCONTD: acetaminophen, DISCONTD: ondansetron (ZOFRAN) IV  Imaging: Dg Chest 2 View  06/24/2011  *RADIOLOGY REPORT*  Clinical Data: Chest pain  CHEST - 2 VIEW  Comparison: None.  Findings: Lungs are clear. No pleural effusion or pneumothorax. The cardiomediastinal contours are within normal limits. The visualized bones and soft tissues are without significant appreciable abnormality.  IMPRESSION: No radiographic evidence for acute cardiopulmonary process.  Original Report Authenticated By: Waneta Martins, M.D.   Assessment/Plan:   Pt is a 51 y.o. yo female with a PMHx of MI ( 10 years ago / no PCI) who was admitted on 06/24/2011 for NSTEMI  NSTEMI; Underwent Cath which showed : Normal left main coronary artery. Normal left anterior descending artery and its branches.Likely spontaneous dissection/ruptured plaque in the OM 3 which propagated back into the mid circumflex. This entire area was stented with overlapping drug-eluting stents. Normal right coronary arter. Ejection fraction 55 %.  Trop 0.57>2.95>5.79>7.54. Hgb A1c 5. Blood pressure well controlled.  LDL of 83. Not a smoker. Currently on Statin, Aspirin, Lopressor and Brilinta  -Will continue current regimen - Will cycle enzymes to see a decline of Trop - ECG  - Will get Case manager consult to assist with mediation. Patient has no insurance.    Length of Stay: 1 days  Patient history and plan of care reviewed with attending, Dr. Aundra Dubin, M.D. 06/25/2011, 9:19 AM  Cardiology Attending Patient interviewed and examined. Discussed with Almyra Deforest, M.D.  See my separate note.  [place] Bing, MD 06/25/2011, 12:12 PM

## 2011-06-26 DIAGNOSIS — I214 Non-ST elevation (NSTEMI) myocardial infarction: Secondary | ICD-10-CM

## 2011-06-26 LAB — BASIC METABOLIC PANEL
BUN: 10 mg/dL (ref 6–23)
CO2: 23 mEq/L (ref 19–32)
Calcium: 8.8 mg/dL (ref 8.4–10.5)
Chloride: 104 mEq/L (ref 96–112)
Creatinine, Ser: 0.65 mg/dL (ref 0.50–1.10)
GFR calc Af Amer: >90 mL/min (ref >90)
GFR calc non Af Amer: >90 mL/min (ref >90)
Glucose, Bld: 98 mg/dL (ref 70–99)
Potassium: 3.3 mEq/L — ABNORMAL LOW (ref 3.5–5.1)
Sodium: 139 mEq/L (ref 135–145)

## 2011-06-26 LAB — CBC
HCT: 37.8 % (ref 36.0–46.0)
Hemoglobin: 13.5 g/dL (ref 12.0–15.0)
MCH: 31.9 pg (ref 26.0–34.0)
MCHC: 35.7 g/dL (ref 30.0–36.0)
MCV: 89.4 fL (ref 78.0–100.0)
Platelets: 175 10*3/uL (ref 150–400)
RBC: 4.23 MIL/uL (ref 3.87–5.11)
RDW: 13.8 % (ref 11.5–15.5)
WBC: 5.3 10*3/uL (ref 4.0–10.5)

## 2011-06-26 LAB — CARDIAC PANEL(CRET KIN+CKTOT+MB+TROPI)
CK, MB: 5.2 ng/mL — ABNORMAL HIGH (ref 0.3–4.0)
Relative Index: 4.8 — ABNORMAL HIGH (ref 0.0–2.5)
Total CK: 109 U/L (ref 7–177)
Troponin I: 5.66 ng/mL (ref <0.30)

## 2011-06-26 LAB — POCT ACTIVATED CLOTTING TIME: Activated Clotting Time: 331 seconds

## 2011-06-26 MED ORDER — POTASSIUM CHLORIDE CRYS ER 20 MEQ PO TBCR
40.0000 meq | EXTENDED_RELEASE_TABLET | Freq: Once | ORAL | Status: AC
  Administered 2011-06-26: 40 meq via ORAL
  Filled 2011-06-26: qty 2

## 2011-06-26 MED ORDER — ASPIRIN 81 MG PO TBEC: 81.0000 mg | DELAYED_RELEASE_TABLET | Freq: Every day | ORAL | Status: AC

## 2011-06-26 MED ORDER — NITROGLYCERIN 0.4 MG SL SUBL: 0.4000 mg | SUBLINGUAL_TABLET | SUBLINGUAL | Status: DC | PRN

## 2011-06-26 MED ORDER — TICAGRELOR 90 MG PO TABS: 90.0000 mg | ORAL_TABLET | Freq: Two times a day (BID) | ORAL | Status: DC

## 2011-06-26 MED ORDER — METOPROLOL TARTRATE 50 MG PO TABS: 25.0000 mg | ORAL_TABLET | Freq: Two times a day (BID) | ORAL | Status: DC

## 2011-06-26 MED ORDER — SIMVASTATIN 40 MG PO TABS: 40.0000 mg | ORAL_TABLET | Freq: Every evening | ORAL | Status: DC

## 2011-06-26 MED FILL — Dextrose Inj 5%: INTRAVENOUS | Qty: 50 | Status: AC

## 2011-06-26 NOTE — Progress Notes (Signed)
Pt discharged to home per MD order. Pt received all discharge instructions and medication information, including prescriptions and follow-up appointments.  Pt alert and oriented at discharge with no complaints of chest pain.  Maria Bishop

## 2011-06-26 NOTE — Care Management Note (Signed)
    Page 1 of 1   06/26/2011     12:35:59 PM   CARE MANAGEMENT NOTE 06/26/2011  Patient:  Maria Bishop, Maria Bishop   Account Number:  [id number]  Date Initiated:  06/26/2011  Documentation initiated by:  Donn Pierini  Subjective/Objective Assessment:   Pt admitted with NSTEMI     Action/Plan:   PTA pt lived at home spouse, was independent with ADLs   Anticipated DC Date:  06/26/2011   Anticipated DC Plan:  HOME/SELF CARE      DC Planning Services  CM consult  Medication Assistance      Choice offered to / List presented to:             Status of service:  Completed, signed off Medicare Important Message given?   (If response is "NO", the following Medicare IM given date fields will be blank) Date Medicare IM given:   Date Additional Medicare IM given:    Discharge Disposition:  HOME/SELF CARE  Per UR Regulation:  Reviewed for med. necessity/level of care/duration of stay  If discussed at Long Length of Stay Meetings, dates discussed:    Comments:  06/26/11- 1230- Donn Pierini RN, BSN [phone] Pt for discharge today, spoke with pt regarding medication needs and PCP needs. Information given to pt on [facility] and also [facility]. Pt is to go home on Brilinta- savings card given to pt along with application for assistance. MD to give pt 2 scripts (on for the 30 day card, one to mail in with the application) Called [facility] (pt's preferred pharmacy) to check on availability of Brilinta- they do not have it in stock at the [place] store- pt's second choice for pharmacy is [facility] on [address]- call made to their pharmacy- they do have drug in stock- pt informed of this and that she can get her generics there also (they are on $4 list). Pt appreciative of assistance.

## 2011-06-26 NOTE — Progress Notes (Addendum)
CARDIAC REHAB PHASE I   PRE:  Rate/Rhythm: 98 SR  BP:  Supine:   Sitting: 120/80  Standing:    SaO2: 100 RA  MODE:  Ambulation: 760 ft   POST:  Rate/Rhythem: 101  BP:  Supine:   Sitting: 110/76  Standing:    SaO2: 100 RA 0745-0900 Tolerated ambulation well without c/o of cp or SOB. HR after walk 101. Pt to side of bed after walk with call light in reach. Completed MI education with pt. She agrees to [employer] in [place], will send referral. Pt has no insurance so I gave her financial form to fill out for Outpt. [facility]. Noted that she has case management consult for medication assistance pending.  Maria Bishop

## 2011-06-26 NOTE — Discharge Summary (Signed)
Discharge Summary   Patient ID: Maria Bishop,  MRN: [id number], DOB/AGE: [birth year] 51 y.o.  Admit date: 06/24/2011 Discharge date: 06/26/2011  Discharge Diagnoses Principal Problem:  *NSTEMI (non-ST elevated myocardial infarction)  Results for Maria Bishop, Maria Bishop (MRN [id number]) as of 06/26/2011 11:36  Ref. Range 06/24/2011 03:35 06/24/2011 06:13 06/24/2011 11:53 06/24/2011 16:00 06/24/2011 23:49 06/25/2011 10:29 06/25/2011 17:32 06/26/2011 01:25  CK, MB Latest Range: 0.3-4.0 ng/mL   25.4 (HH) 30.0 (HH) 24.6 (HH) 15.3 (HH) 8.6 (HH) 5.2 (H)  CK Total Latest Range: 7-177 U/L   218 (H) 245 (H) 228 (H) 178 (H) 138 109  Troponin I Latest Range: <0.30 ng/mL <0.30 0.57 (HH) 2.95 (HH) 5.79 (HH) 7.54 (HH) 8.84 (HH) 7.40 (HH) 5.66 (HH)   - Cardiac cath on 05/18:  OM3 with spontaneous dissection vs ruptured plaque propagating back into the mid LCx s/p overlapping DES to this area with adequate post-dilatation; LVEF 55%   Active Problems:  CAD (coronary artery disease)  - Started on DAPT- ASA/Brilinta x 1 year; Lopressor, Lipitor and NTG SL PRN  Allergies No Known Allergies  Diagnostic Studies/Procedures  CHEST - 2 VIEW- 06/24/11  Comparison: None.  Findings: Lungs are clear. No pleural effusion or pneumothorax. The  cardiomediastinal contours are within normal limits. The visualized  bones and soft tissues are without significant appreciable  abnormality.  IMPRESSION:  No radiographic evidence for acute cardiopulmonary process.  CARDIAC CATHETERIZATION + PCI- 06/24/11  PROCEDURE: Left heart catheterization with selective coronary angiography, left ventriculogram. PCI OM3.  INDICATIONS: Non-STEMI with refractory chest pain. Emergency consent.  The risks, benefits, and details of the procedure were explained to the patient. The patient verbalized understanding and wanted to proceed. Informed written consent was obtained.  PROCEDURE TECHNIQUE: After Xylocaine anesthesia a 64F sheath was  placed in the right femoral artery with a single anterior needle wall stick. Left coronary angiography was done using a Judkins L4 guide catheter. Right coronary angiography was done using a Judkins R4 guide catheter. Left ventriculography was done using a pigtail catheter. Intervention was then performed.  CONTRAST: Total of 200 cc.  COMPLICATIONS: None.  HEMODYNAMICS: Aortic pressure was 113/78; LV pressure was 119/2 ; LVEDP 12. There was no gradient between the left ventricle and aorta.  ANGIOGRAPHIC DATA: The left main coronary artery is a large vessel which is angiographically normal.  The left anterior descending artery is a large vessel which wraps around the apex. It appears angiographically normal. The distal vessel is small in caliber. There are several very small diagonals which are patent.  The left circumflex artery is is a large codominant vessel which proximally, appears angiographically normal. There is a medium-sized OM 1 and OM 2 which are widely patent. The next significant obtuse marginal has a appearance of a ruptured plaque /dissection in the mid to distal portion. The narrowing extends proximally to the ostium of the vessel. There is some mild narrowing in the mid circumflex just before this obtuse marginal 3 originates. There is a small OM for branch which has some mild disease but is patent.  The right coronary artery is a large dominant vessel. It appears angiographic normal. The PDA is medium sized.  LEFT VENTRICULOGRAM: Left ventricular angiogram was done in the 30 RAO projection and revealed normal left ventricular wall motion and systolic function with an estimated ejection fraction of 55 %. LVEDP was 12 mmHg.  PCI NARRATIVE: A CLS 3.5 guiding catheters used to engage the left main. A pro-water wire was used to cross  the long area of disease. Angiomax used for anticoagulation. An ACT was used to confirm that the Angiomax is therapeutic. Initially, a 2.0 x 20 emerge balloon was  used to predilate the long area of disease in the OM 3. A 2.25 x 32 Promus elements stent was advanced to the OM 3 and deployed at 12 atmospheres. There appeared to be a distal edge dissection. This was treated with a 2.25 x 8 Promus element stent deployed in an overlapping fashion. The overlap of the stented area was postdilated with the stent balloon. Further examination of the angiograms revealed that the initial dissection extended into the mid circumflex. This is a much larger caliber vessel. A 3.0 x 16 Promus element stent was then deployed at 14 atmospheres overlapping the proximal edge of the long 2.25 stent. After stent deployment, the OM 3 increased significantly in size with resolution of the intramural hematoma. The 3.0 stent balloon was then used to post dilate the entire OM 3 stented area. A 3.5 x 12 Hawi Quantum apex balloon was then used to post dilate the most proximal stent inflated to 16 atmospheres and then to 12 atmospheres. There is an excellent angiographic result. There is no residual dissection. TIMI-3 flow was restored throughout.  IMPRESSIONS:  1. Normal left main coronary artery. 2. Normal left anterior descending artery and its branches. 3. 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 This entire area was stented with overlapping drug-eluting stents. The distal most area was postdilated to 3.0 m in diameter. The more proximal area was postdilated to 3.6 mm in diameter. 4. Normal right coronary artery. 5. Normal left ventricular systolic function. LVEDP 12 mmHg. Ejection fraction 55 %. RECOMMENDATION: The patient is a long stented area now in her OM 3 and mid circumflex. She needs to continue dual antiplatelet therapy for at least a year. After talking to the patient, she reports a similar episode to this about 10 years ago when she was in [place], [place]. She was told that an artery in the back side of her heart towards  the bottom had "deteriorated." It was a small artery and was not fixed with any kind of balloon or stent. The artery fixed today had a similar appearance in the same location. We chose to treat this area with stents due to the severe stenosis at a focal dissection flap and another area proximal. She also had several episodes of recurrent, severe pain. The concern if medical treatment was used, was whether the dissection would continue to propagate. Disease, which was likely intramural hematoma, appeared to start in the distal OM 3 and propagated back into the mid circumflex. There is a large margin of normal vessel that was stented in the mid circumflex to avoid any further propagation of intramural hematoma or dissection flap. The patient has a significant family history of coronary artery disease. There was some atherosclerotic material noted on catheter exchanges. It is very difficult to know whether there was a focus of atherosclerosis that caused the dissection. Regardless, she needs aggressive secondary prevention including avoiding tobacco, lipid-lowering therapy and beta blockade if blood pressure will allow.  History of Present Illness  Maria Bishop is a 51yo female with PMHx significant for CAD (MI ~10 years prior to this admission), premature family history of CAD and no other known PMHx who was transferred [facility] on 06/24/11 with NSTEMI.  Regarding her prior MI, she described it  as an "occlusion in small vessel in the back of the heart," for which she did not receive a stent. She had been stable from a cardiac standpoint since that time, and wwas in her USOH until the day prior to admission. She was putting away groceries and experienced sudden, substernal chest pain radiating to her neck and bilateral shoulder, described as intermittent. It woke her up from sleep the following morning, prompting her ED presentation. She described it as similar in quality to  her previous MI.   In the ED, EKG did not reveal evidence of ischemia. CXR was without acute cardiopulmonary abnormalities as above. Initial troponin was normal, however subsequent reading was elevated at 0.57 (trended above). She received morphine and NTG with some relief in pain. She was noted to have sinus tachycardia with evidence of NSVT on telemetry, for which she received Lopressor IV with appropriate response. She was started on NTG IV and heparin. She was informed, consented and transported to [facility] cath lab with persistent chest pain.   Hospital Course   The R groin was prepped and accessed. The full details of the procedure are described above. Notably, left main, LAD, RCA were normal. LVEF 55%. There was evidence of dissection vs ruptured plaque in OM3 which propagated back into the mid LCx. Several overlapping DES were placed to this area with appropriate dilatory results. The patient tolerated the procedure well without complications. The recommendation was made to continue DAPT x 1 year (ASA/Brilinta) and to pursue risk reduction strategies.   She was transferred back to the floor in good condition. A lipid panel was ordered revealing low HDL at 38; LDL at 83. Otherwise WNL. Cardiac enzymes continued to trend as noted above. As expected, CK-MB trended down consecutively, with a stable, slow down-trend of troponin-I noted. TSH was WNL. Hgb A1C returned at 5.0. Her symptoms improved. She did have some evidence of sinus tachycardia on ambulation and trace episodic NSVT. This improved on Lopressor throughout the admission. She ambulated well with cardiac rehab without acute incident. Her chest pain improved. She remained hemodynamically stable without acute incident post-cath.   Today, she is stable, in good condition and will be discharged home. The patient does not have health insurance. Case management was consulted regarding financial assistance with Brilinta. Arrangements with  prescriptions were made to accommodate this. Lipitor was switched to Zocor as it is on the [facility] $4 list. ASA full-dose was down-titrated to 81mg  daily. As above, she will resume DAPT x 1 year. Lopressor was continued. NTG was refilled. She will follow-up in the office in ~10 days as outlined below. She will schedule outpatient cardiac rehab appointments as directed. This information, including activity restrictions and supplemental ACS information, has been clearly outlined in the discharge AVS.   Discharge Vitals:  Blood pressure 115/73, pulse 97, temperature 98.2 F (36.8 C), temperature source Oral, resp. rate 18, height 5\' 2"  (1.575 m), weight 77.111 kg (170 lb), last menstrual period 06/03/2011, SpO2 99.00%.   Weight change: 0.012 kg (0.4 oz)  Labs: Recent Labs  [facility] 06/26/11 0700 06/25/11 0612   WBC 5.3 6.1   HGB 13.5 12.7   HCT 37.8 36.6   MCV 89.4 89.3   PLT 175 167    Lab 06/26/11 0700 06/25/11 0612 06/24/11 0335  NA 139 138 138  K 3.3* 3.5 3.6  CL 104 104 105  CO2 23 21 22   BUN 10 6 14   CREATININE 0.65 0.56 0.71  CALCIUM 8.8 8.9 9.4  PROT -- 6.1 --  BILITOT -- 0.9 --  ALKPHOS -- 44 --  ALT -- 12 --  AST -- 29 --  AMYLASE -- -- --  LIPASE -- -- --  GLUCOSE 98 93 93   Recent Labs  Basename 06/24/11 1154   HGBA1C 5.0   Recent Labs  Basename 06/26/11 0125 06/25/11 1732 06/25/11 1029   CKTOTAL 109 138 178*   CKMB 5.2* 8.6* 15.3*   CKMBINDEX -- -- --   TROPONINI 5.66* 7.40* 8.84*   Recent Labs  Basename 06/25/11 0612   CHOL 134   HDL 38*   LDLCALC 83   TRIG 66   CHOLHDL 3.5   LDLDIRECT --    Basename 06/25/11 1029  TSH 1.616  T4TOTAL --  T3FREE --  THYROIDAB --    Disposition:  Discharge Orders    Future Appointments: Provider: Department: Dept Phone: Center:   07/06/2011 9:50 AM Beatrice Lecher, PA [facility] [phone] [facility]     Future Orders Please Complete By Expires   Amb Referral to Cardiac Rehabilitation         Follow-up Information    Follow up with Tereso Newcomer, PA on 07/06/2011. (At 9:50 AM for follow-up after this hospitalization. )    Contact information:   [address] [place] [address] [phone]         Discharge Medications:  Medication List  As of 06/26/2011 12:26 PM   START taking these medications         aspirin 81 MG EC tablet   Take 1 tablet (81 mg total) by mouth daily.      metoprolol 50 MG tablet   Commonly known as: LOPRESSOR   Take 0.5 tablets (25 mg total) by mouth 2 (two) times daily.      simvastatin 40 MG tablet   Commonly known as: ZOCOR   Take 1 tablet (40 mg total) by mouth every evening.      Ticagrelor 90 MG Tabs tablet   Commonly known as: BRILINTA   Take 1 tablet (90 mg total) by mouth 2 (two) times daily.         CHANGE how you take these medications         nitroGLYCERIN 0.4 MG SL tablet   Commonly known as: NITROSTAT   Place 1 tablet (0.4 mg total) under the tongue every 5 (five) minutes as needed.   What changed: dose         CONTINUE taking these medications         fexofenadine 180 MG tablet   Commonly known as: ALLEGRA      ibuprofen 200 MG tablet   Commonly known as: ADVIL,MOTRIN      OVER THE COUNTER MEDICATION      OVER THE COUNTER MEDICATION      [facility] WORT PO          Where to get your medications    These are the prescriptions that you need to pick up.   You may get these medications from any pharmacy.         metoprolol 50 MG tablet   nitroGLYCERIN 0.4 MG SL tablet   simvastatin 40 MG tablet   Ticagrelor 90 MG Tabs tablet         Information on where to get these meds is not yet available. Ask your nurse or doctor.         aspirin 81 MG EC  tablet           Outstanding Labs/Studies: None  Duration of Discharge Encounter: Greater than 30 minutes including physician time.  Signed, R. Hurman Horn, PA-C 06/26/2011, 12:26 PM

## 2011-06-26 NOTE — Discharge Instructions (Addendum)
PLEASE REMEMBER TO BRING ALL OF YOUR MEDICATIONS TO EACH OF YOUR FOLLOW-UP OFFICE VISITS.  PLEASE ATTEND ALL FOLLOW-UP APPOINTMENTS. PLEASE TAKE ALL NEW MEDICATIONS/MEDICATION CHANGES AS PRESCRIBED.   Activity: Increase activity slowly as tolerated. You may shower, but no soaking baths for 5 days. No driving for 1 days. No lifting over 5 lbs for 5 days. No sexual activity for 5 days.   You May Return to Work: in 5 days (if applicable)  Wound Care: You may wash cath site gently with soap and water. Keep cath site clean and dry. If you notice pain, swelling, bleeding or pus at your cath site, please call [phone].  Acute Coronary Syndrome Acute coronary syndrome (ACS) is an urgent problem in which the blood and oxygen supply to the heart is critically deficient. ACS requires hospitalization because one or more coronary arteries may be blocked. ACS represents a range of conditions including:  Previous angina that is now unstable, lasts longer, happens at rest, or is more intense.   A heart attack, with heart muscle cell injury and death.  There are three vital coronary arteries that supply the heart muscle with blood and oxygen so that it can pump blood effectively. If blockages to these arteries develop, blood flow to the heart muscle is reduced. If the heart does not get enough blood, angina may occur as the first warning sign. SYMPTOMS   The most common signs of angina include:   Tightness or squeezing in the chest.   Feeling of heaviness on the chest.   Discomfort in the arms, neck, or jaw.   Shortness of breath and nausea.   Cold, wet skin.   Angina is usually brought on by physical effort or excitement which increase the oxygen needs of the heart. These states increase the blood flow needs of the heart beyond what can be delivered.  TREATMENT   Medicines to help discomfort may include nitroglycerin (nitro) in the form of tablets or a spray for rapid relief, or longer-acting  forms such as cream, patches, or capsules. (Be aware that there are many side effects and possible interactions with other drugs).   Other medicines may be used to help the heart pump better.   Procedures to open blocked arteries including angioplasty or stent placement to keep the arteries open.   Open heart surgery may be needed when there are many blockages or they are in critical locations that are best treated with surgery.  HOME CARE INSTRUCTIONS   Avoid smoking.   Take one baby or adult aspirin daily, if your caregiver advises. This helps reduce the risk of a heart attack.   It is very important that you follow the angina treatment prescribed by your caregiver. Make arrangements for proper follow-up care.   Eat a heart healthy diet with salt and fat restrictions as advised.   Regular exercise is good for you as long as it does not cause discomfort. Do not begin any new type of exercise until you check with your caregiver.   If you are overweight, you should lose weight.   Try to maintain normal blood lipid levels.   Keep your blood pressure under control as recommended by your caregiver.   You should tell your caregiver right away about any increase in the severity or frequency of your chest discomfort or angina attacks. When you have angina, you should stop what you are doing and sit down. This may bring relief in 3 to 5 minutes. If your caregiver has  prescribed nitro, take it as directed.   If your caregiver has given you a follow-up appointment, it is very important to keep that appointment. Not keeping the appointment could result in a chronic or permanent injury, pain, and disability. If there is any problem keeping the appointment, you must call back to this facility for assistance.  SEEK IMMEDIATE MEDICAL CARE IF:   You develop nausea, vomiting, or shortness of breath.   You feel faint, lightheaded, or pass out.   Your chest discomfort gets worse.   You are sweating  or experience sudden profound fatigue.   You do not get relief of your chest pain after 3 doses of nitro.   Your discomfort lasts longer than 15 minutes.  MAKE SURE YOU:   Understand these instructions.   Will watch your condition.   Will get help right away if you are not doing well or get worse.  Document Released: 01/23/2005 Document Revised: 01/12/2011 Document Reviewed: 08/27/2007 [facility] Patient Information 2012 [place], [place].  Non-ST Segment Elevation Myocardial Infarction  A heart attack occurs when a blood vessel on the surface of the heart (coronary artery) is blocked and interrupts blood supply to the heart muscle. This causes that area of the heart muscle to permanently scar. This blockage may be caused by cholesterol buildup (atherosclerotic plaque) within a coronary artery. The plaque cracks which creates a rough surface where blood cells attach, forming a clot. Chest discomfort that happens with exertion and goes away with rest is called angina. This is a warning signal that blood flow to the heart is not enough. Angina that does not go away or becomes worse may mean that there is actual heart damage and a scar may form. ST elevation refers to waveforms seen on an EKG or tracing of the electrical activity in the heart. Your provider can tell if there has been damage to your heart from changes in the normal pattern. A NSTEMI heart attack may be smaller and not as serious as one with typical changes. After an NSTEMI, there is a higher chance of another heart attack and returning angina after you have recovered. CAUSES Plaque in the coronary arteries. It builds up over many years.  Smoking. Smoking reduces the oxygen supply to the heart because carbon monoxide is more readily carried by the blood cells. Smoking also makes plaque develop faster. STOP SMOKING.  A narrowing (spasm) of a coronary artery.  Increased oxygen use. This can occur during extreme stress or activities.    Use of stimulants. Stimulants, such as cocaine and amphetamines, dangerously and unpredictably increase the oxygen needs of the heart. They can make the heart beat faster and unevenly. Stop using street drugs.  RISK FACTORS Age-Risk increases with age for both men and women.  Menopause-After menopause and age 11, women have heart attacks at the same rate as men.  Diabetes-Maintaining a normal blood sugar and eating a balanced diet lessens the chance of a heart attack.  Obesity-Try to maintain a close to ideal body weight or as your caregiver suggests.  High blood pressure (hypertension)-Makes the heart work harder.  High cholesterol-Promotes the buildup of plaque in the blood vessels.  SYMPTOMS  Chest pain, especially if it radiates down the left arm, up into the neck, jaw or teeth, often comes from the heart. This is even more likely if the pain leaves with rest. Problems with the heart may mimic indigestion and anyone older than 35 should not ignore this symptom. Other typical problems (symptoms)  include: Profound sweating.  Feeling faint, weak or light-headed.  Feeling sick to your stomach.  Loss of normal color.  DIAGNOSIS  A combination of your history, an exam, EKG findings and blood work results determine if you have had a heart attack. It is very important to seek medical care right away for episodes of chest pain. The sooner you get treatment, the sooner you may return to your normal activities. If a large area of heart muscle lacks oxygen and medical care is not provided; a weak heart muscle, heart failure or sudden death may result. WHAT MAY HAPPEN IF A HEART ATTACK IS SUSPECTED Aspirin may be given, if you are able to take it. This makes your blood "thinner" (less likely to clot).  Thrombolytics ("clot busters") may be given as long as it is safe and if a cardiac cath lab is not available.  A heart monitor will display the electrical activity of your heart to check for abnormal  beats or rhythm.  An EKG is a painless procedure that gives information about areas of heart muscle that may be injured.  Your blood oxygen level may be monitored by a painless sensor attached to your finger or ear.  Blood tests are used to find out whether the heart muscle has been damaged.  A chest X-ray can give some indication of how well the heart and lungs are functioning.  A coronary angiogram may be performed. This is a procedure where dye is injected into your coronary arteries and x-rays are taken to determine which blood vessels are blocked.  Angioplasty or stent placement may be used during an angiogram to open a blocked vessel.  An echocardiogram is a painless and risk free external sonar exam that may be used to examine your heart valves, muscle function and blood flow within the heart.  TREATMENT Length of hospital stays vary from a couple days to a week. This depends on the amount of heart damage and the severity of any complications.  If your symptoms are a false alarm and no heart disease is found, the stay is often less than 24 hours.  Medications may be used for reducing pain, keeping your heart beat regularly, helping your breathing and controlling your blood pressure.  Blood thinners may be used to dissolve clots.  If you have a single small artery blockage and no heart damage, you may have a balloon angioplasty. This procedure may displace the blockage and restore normal heart circulation. Stents most often follow angioplasty right away. Stents are small wire mesh-like tubes that help keep the artery open. The earlier this is done, the better.  Severe heart problems may require open heart surgery. This is a procedure where blocked arteries are bypassed with small veins from your legs or arteries from inside your chest wall. If important arteries are involved, or if your chest pain continues, this may be the best method to make ensure your long-term survival.  ABOUT [facility]  STAY While you are in the hospital, you may be placed on a low salt, low fat diet and given a stool softener. The stool softener will keep you from straining during a bowel movement.  Oxygen may be given to increase oxygen delivery to the heart.  Medications may be prescribed, while in the hospital, to help your heart and lungs work better.  Before discharge from the hospital, a stress test may be performed. In this test, an ECG measures how well your heart works with exercise. The test  may be done while you are walking on a treadmill, using a stationary bicycle or after being given medications to make your heart beat faster. It can be used to judge the safety of your proposed activity levels. It may provide a starting point for your exercise program.  HOME CARE INSTRUCTIONS  Follow the treatment plan your caregiver prescribes.  Carry medications, such as nitroglycerine, with you at all times, if directed.  Make a list of every medicine you are taking. Keep it up-to-date and with you all the time.  Get help from your caregiver or pharmacist to learn the following about each medicine:  Why you are taking it.  What time of day to take it.  Possible side effects.  Foods to take with it or avoid.  When to stop taking it.  Try to maintain normal blood lipid levels.  Eat a heart healthy diet with salt and fat restrictions as advised.  Activity Level-Everyone heals at a different rate. Decisions about when you may go back to work, start to exercise or have sex should be made with the guidance of your caregivers. Pace your activities to avoid shortness of breath or chest pain.  Weight Monitoring-Weigh yourself every day. You should weigh yourself in the morning after you urinate and before you eat breakfast. Wear the same amount of clothing when you weigh yourself. Record your weight daily. Bring your recorded weights to your clinic visits. Tell your caregiver right away if you have gained 3 lb/1.4 kg in 1  day or 5 lb/2.3 kg in a week.  Blood pressure monitoring-This should be done as often as you are told to check it. You can get a home blood pressure cuff at your drugstore. Record these values and bring them with you for your health checks.  Smoking-If you are currently a smoker, it is time to quit. Nicotine makes your heart work harder. Do not use nicotine gum or patches before checking with your doctor. Find a support group or therapist to help you quit.  Follow-up-Be sure to make and keep an appointment with your caregiver. Appointments with your cardiologist and other caregivers may also be needed.  SEEK IMMEDIATE MEDICAL CARE IF:  You have severe chest pain, especially if the pain is crushing or pressure-like and spreads to the arms, back, neck or jaw. THIS IS AN EMERGENCY. Do not wait to see if the pain will go away. Get medical help at once. Call your local emergency services (911 in the [place] DO NOT drive yourself to the hospital.  You start sweating, feel sick to your stomach or are short of breath.  Your weight increases by 3 lb/1.4 kg or more in 1 day or 5 lb/2.3 kg in a week.  You notice increasing shortness of breath during rest, sleeping or with activity.  You develop an increase in angina or develop chest pain which is unusual for you.  You are unable to sleep because you cannot breathe.  MAKE SURE YOU:  Understand these instructions.  Will watch your condition.  Will get help right away if you are not doing well or get worse.  Document Released: 08/22/2004 Document Revised: 01/12/2011 Document Reviewed: 03/22/2007 [facility] Patient Information 2012 [place], [place].

## 2011-06-26 NOTE — Progress Notes (Signed)
Utilization review completed.  

## 2011-06-26 NOTE — Progress Notes (Signed)
Patient Name: Maria Bishop Date of Encounter: 06/26/2011     Principal Problem:  *NSTEMI (non-ST elevated myocardial infarction) Active Problems:  CAD (coronary artery disease)    SUBJECTIVE: Feels well. No chest pain or dyspnea. No groin pain.   OBJECTIVE  Filed Vitals:   06/25/11 1356 06/25/11 2100 06/26/11 0500 06/26/11 0929  BP: 106/72 106/73 105/61 115/73  Pulse: 89 89 97 97  Temp: 98.2 F (36.8 C) 98.9 F (37.2 C) 98.2 F (36.8 C)   TempSrc: Oral Oral Oral   Resp: 20 18 18    Height: 5\' 2"  (1.575 m)     Weight: 77.111 kg (170 lb)     SpO2: 100% 100% 99%     Intake/Output Summary (Last 24 hours) at 06/26/11 1124 Last data filed at 06/25/11 1300  Gross per 24 hour  Intake    240 ml  Output    700 ml  Net   -460 ml   Weight change: 0.012 kg (0.4 oz)  PHYSICAL EXAM  General:  Well developed, well nourished, in no acute distress. Head:  Normocephalic, atraumatic, sclera non-icteric, no xanthomas, nares are without discharge.  Neck:  Supple without bruits or JVD. Lungs:   Resp regular and unlabored, CTA. Heart:  RRR no s3, s4, or murmurs. Abdomen:  Soft, non-tender, non-distended, BS + x 4.  Msk:   Strength and tone appears normal for age. Extremities: No clubbing, cyanosis or edema. DP/PT/Radials 2+ and equal bilaterally. Neuro:  Alert and oriented X 3. Moves all extremities spontaneously. Psych: Normal affect.  LABS:  Recent Labs  [facility] 06/26/11 0700 06/25/11 0612   WBC 5.3 6.1   HGB 13.5 12.7   HCT 37.8 36.6   MCV 89.4 89.3   PLT 175 167   No results found for this basename: VITAMINB12,FOLATE,FERRITIN,TIBC,IRON,RETICCTPCT in the last 72 hours No results found for this basename: DDIMER:2 in the last 72 hours  Lab 06/26/11 0700 06/25/11 0612 06/24/11 0335  NA 139 138 138  K 3.3* 3.5 3.6  CL 104 104 105  CO2 23 21 22   BUN 10 6 14   CREATININE 0.65 0.56 0.71  CALCIUM 8.8 8.9 9.4  PROT -- 6.1 --  BILITOT -- 0.9 --  ALKPHOS -- 44 --    ALT -- 12 --  AST -- 29 --  AMYLASE -- -- --  LIPASE -- -- --  GLUCOSE 98 93 93   Recent Labs  Basename 06/24/11 1154   HGBA1C 5.0   Recent Labs  Basename 06/26/11 0125 06/25/11 1732 06/25/11 1029   CKTOTAL 109 138 178*   CKMB 5.2* 8.6* 15.3*   CKMBINDEX -- -- --   TROPONINI 5.66* 7.40* 8.84*   No components found with this basename: POCBNP Recent Labs  Basename 06/25/11 0612   CHOL 134   HDL 38*   LDLCALC 83   TRIG 66   CHOLHDL 3.5   LDLDIRECT --    Basename 06/25/11 1029  TSH 1.616  T4TOTAL --  T3FREE --  THYROIDAB --     TELE: sinus rhythm, sinus tach  Radiology/Studies:  Dg Chest 2 View  06/24/2011  *RADIOLOGY REPORT*  Clinical Data: Chest pain  CHEST - 2 VIEW  Comparison: None.  Findings: Lungs are clear. No pleural effusion or pneumothorax. The cardiomediastinal contours are within normal limits. The visualized bones and soft tissues are without significant appreciable abnormality.  IMPRESSION: No radiographic evidence for acute cardiopulmonary process.  Original Report Authenticated By: Waneta Martins, M.D.  Current Medications:     . aspirin EC  81 mg Oral Daily  . atorvastatin  80 mg Oral q1800  . metoprolol tartrate  25 mg Oral BID  . sodium chloride  3 mL Intravenous Q12H  . Ticagrelor  90 mg Oral BID    ASSESSMENT AND PLAN: 1. NSTEMI - s/p PCI of the LCx with overlapping DES 2. Hyperlipidemia  Meds reviewed. Agree with current medications except will change lipitor to simvastatin for cost. Continue ASA 81 mg and brilinta at least 12 months. Follow-up with Tereso Newcomer for post-hospital and I will follow her long-term.  Signed, R. Hurman Horn, PA-C 06/26/2011, 11:24 AM

## 2011-06-30 ENCOUNTER — Telehealth: Payer: Self-pay | Admitting: Cardiology

## 2011-06-30 MED ORDER — METOPROLOL TARTRATE 12.5 MG HALF TABLET: 12.5000 mg | ORAL_TABLET | Freq: Two times a day (BID) | ORAL | Status: DC

## 2011-06-30 NOTE — Telephone Encounter (Signed)
Please return call to patient at [phone]  Patient had cath on 06/24/11 w/wall sched to see Bing Neighbors. On 5/30. Patient c/o problems with low BP, over the last couple of days.   Patient 99/75 Wed, some SOB,  dizziness and weakness,   98/70 this morning. Please return call to advise   WARM XFER TO JODETTE

## 2011-06-30 NOTE — Telephone Encounter (Signed)
06/24/11 3 stents placed, new med Metoprolol, brilinta and zocor. C/o hypotention current 98/70 no meds p 96. Prior bp 110/75, 100/80, 102/78. Pt has not been getting pulses- asked her to get with each bp check and agreed to do.  She states she has never had HTN normal bp 110-118/ 65-70. Her metoprolol has been reduced once and she continues to c/o of weakness/ dizziness/ sluggish. Spoke with Lawson Fiscal gerhardt np and ordered her to reduce again to metoprolol tart 12.5 mg BID, continue to check bp/p and call with further questions or concerns. Pt agreed to plan and has f/u 07/06/11 with Tereso Newcomer PA

## 2011-07-05 NOTE — Discharge Summary (Signed)
Agree as outlined. Please see my progress note the same date.  Maria Bishop 07/05/2011 10:10 PM

## 2011-07-06 ENCOUNTER — Encounter: Payer: Self-pay | Admitting: Physician Assistant

## 2011-07-06 ENCOUNTER — Ambulatory Visit (INDEPENDENT_AMBULATORY_CARE_PROVIDER_SITE_OTHER): Payer: Self-pay | Admitting: Physician Assistant

## 2011-07-06 VITALS — BP 92/70 | HR 66 | Ht 62.0 in | Wt 174.1 lb

## 2011-07-06 DIAGNOSIS — I2510 Atherosclerotic heart disease of native coronary artery without angina pectoris: Secondary | ICD-10-CM

## 2011-07-06 DIAGNOSIS — I959 Hypotension, unspecified: Secondary | ICD-10-CM

## 2011-07-06 DIAGNOSIS — E785 Hyperlipidemia, unspecified: Secondary | ICD-10-CM

## 2011-07-06 NOTE — Patient Instructions (Addendum)
Try to take Lopressor 1/4 tablet (6.25 mg) twice daily. If you still feel poorly with this, change to take Lopressor 25 mg 1/2 tablet (12.5 mg) at bedtime only. If you still feel bad with this, call us.    08/18/11 FASTING LIPID AND LIVER PANEL 272.4  FOLLOW UP WITH DR. Excell Seltzer IN 6-8 WEEKS

## 2011-07-06 NOTE — Progress Notes (Signed)
[address] [place], [place]  [address] Phone: [phone] Fax:  [phone]  Date:  07/06/2011   Name:  Maria Bishop   DOB:  10/28/1960   MRN:  [id number]  PCP:  No primary provider on file.  Primary Cardiologist:  Dr. Tonny Bollman  Primary Electrophysiologist:  None    History of Present Illness: Maria Bishop is a 51 y.o. female who returns for post hospital follow up.  She has a reported history of myocardial infarction some 10 years ago.  She was admitted 5/18-5/20 with a NSTEMI.  She presented with sudden onset of chest pain.  Initial EKG was non-acute.  Her initial troponin was elevated.  She had persistent pain and was eventually taken emergently to the cardiac catheterization lab.  LHC 06/24/11: Likely spontaneous dissection/ruptured plaque in the OM3 which propagated back into the mid circumflex.  No disease in the left main, LAD or RCA.  EF 55%.  PCI: overlapping Promus DES x3 to the OM 3.  Dual antiplatelet therapy was recommended for one year.  Aggressive risk factor modification was also recommended.  She is doing well.  The patient denies chest pain, shortness of breath, syncope, orthopnea, PND or significant pedal edema.  She does feel tired.  She notes that her blood pressure has been running low.  She usually feels well until she takes her metoprolol.  The dosage has already been cut in half since discharged from the hospital.  Wt Readings from Last 3 Encounters:  07/06/11 174 lb 1.9 oz (78.98 kg)  06/25/11 170 lb (77.111 kg)  06/25/11 170 lb (77.111 kg)     Potassium  Date/Time Value Range Status  06/26/2011  7:00 AM 3.3* 3.5-5.1 (mEq/L) Final     Creatinine, Ser  Date/Time Value Range Status  06/26/2011  7:00 AM 0.65  0.50-1.10 (mg/dL) Final     ALT  Date/Time Value Range Status  06/25/2011  6:12 AM 12  0-35 (U/L) Final     TSH  Date/Time Value Range Status  06/25/2011 10:29 AM 1.616  0.350-4.500 (uIU/mL) Final       Hemoglobin  Date/Time Value Range Status  06/26/2011  7:00 AM 13.5  12.0-15.0 (g/dL) Final    Past Medical History  Diagnosis Date  . Myocardial infarction     ~50yrs ago  . Degenerative disc disease   . Cervical cancer   . NSTEMI (non-ST elevated myocardial infarction) 06/24/2011    likely spontaneous dissection/ruptured plaque in OM3 - PCI: 3 overlapping Promus DES to OM3, EF 55%  . CAD (coronary artery disease)     Current Outpatient Prescriptions  Medication Sig Dispense Refill  . aspirin EC 81 MG EC tablet Take 1 tablet (81 mg total) by mouth daily.      . fexofenadine (ALLEGRA) 180 MG tablet Take 180 mg by mouth daily.      Marland Kitchen ibuprofen (ADVIL,MOTRIN) 200 MG tablet Take 800 mg by mouth every 6 (six) hours as needed. For pain      . metoprolol (LOPRESSOR) 12.5 mg TABS Take 0.5 tablets (12.5 mg total) by mouth 2 (two) times daily.  30 tablet  3  . nitroGLYCERIN (NITROSTAT) 0.4 MG SL tablet Place 1 tablet (0.4 mg total) under the tongue every 5 (five) minutes as needed.  25 tablet  3  . OVER THE COUNTER MEDICATION 2 tablets. Herbal supplement for menopause      . OVER THE COUNTER MEDICATION Take 2 tablets by mouth daily. Herbal supplement for  nerves      . simvastatin (ZOCOR) 40 MG tablet Take 1 tablet (40 mg total) by mouth every evening.  30 tablet  3  . ST JOHNS WORT PO Take 2 tablets by mouth daily.      . Ticagrelor (BRILINTA) 90 MG TABS tablet Take 1 tablet (90 mg total) by mouth 2 (two) times daily.  60 tablet  3    Allergies: No Known Allergies  History  Substance Use Topics  . Smoking status: Former Games developer  . Smokeless tobacco: Not on file  . Alcohol Use: Yes      PHYSICAL EXAM: VS:  BP 92/70  Pulse 66  Ht 5\' 2"  (1.575 m)  Wt 174 lb 1.9 oz (78.98 kg)  BMI 31.85 kg/m2  LMP 06/03/2011 Well nourished, well developed, in no acute distress HEENT: normal Neck: no JVD Cardiac:  normal S1, S2; RRR; no murmur Lungs:  clear to auscultation bilaterally, no  wheezing, rhonchi or rales Abd: soft, nontender, no hepatomegaly Ext: no edema; right groin without hematoma or bruit  Skin: warm and dry Neuro:  CNs 2-12 intact, no focal abnormalities noted  EKG:  Sinus rhythm, heart rate 66, normal axis, nonspecific ST-T wave changes   ASSESSMENT AND PLAN:  1.  Coronary Artery Disease Doing well post MI. We discussed the importance of dual antiplatelet therapy.  She is waiting to hear back from cardiac rehab. She has applied for assistance for Brilinta. We will see if we have any samples for her. Follow up with Dr. Tonny Bollman in 6-8 weeks.  2.  Dyslipidemia Check FLP and LFTs in 6-8 weeks.  3.  Hypotension We discussed the importance of beta blockers post MI. I have asked her to cut Lopressor to 6.25 mg bid.  If she feels bad with this, she can try Lopressor 12.5 mg QHS. She will call us if she continues to feel bad.    SignedTereso Newcomer, PA-C  10:03 AM 07/06/2011

## 2011-08-30 ENCOUNTER — Other Ambulatory Visit: Payer: Self-pay

## 2011-08-30 ENCOUNTER — Ambulatory Visit: Payer: Self-pay | Admitting: Cardiovascular Disease

## 2011-09-06 ENCOUNTER — Ambulatory Visit (INDEPENDENT_AMBULATORY_CARE_PROVIDER_SITE_OTHER): Payer: Self-pay | Admitting: Cardiovascular Disease

## 2011-09-06 ENCOUNTER — Encounter: Payer: Self-pay | Admitting: Cardiovascular Disease

## 2011-09-06 VITALS — BP 119/75 | HR 83 | Ht 62.0 in | Wt 173.0 lb

## 2011-09-06 DIAGNOSIS — I2510 Atherosclerotic heart disease of native coronary artery without angina pectoris: Secondary | ICD-10-CM

## 2011-09-06 DIAGNOSIS — E785 Hyperlipidemia, unspecified: Secondary | ICD-10-CM | POA: Insufficient documentation

## 2011-09-06 LAB — HEPATIC FUNCTION PANEL
ALT: 14 U/L (ref 0–35)
AST: 16 U/L (ref 0–37)
Albumin: 4.6 g/dL (ref 3.5–5.2)
Alkaline Phosphatase: 48 U/L (ref 39–117)
Bilirubin, Direct: 0.1 mg/dL (ref 0.0–0.3)
Total Bilirubin: 1.1 mg/dL (ref 0.3–1.2)
Total Protein: 7 g/dL (ref 6.0–8.3)

## 2011-09-06 LAB — LIPID PANEL
Cholesterol: 166 mg/dL (ref 0–200)
HDL: 41.8 mg/dL (ref >39.00)
LDL Cholesterol: 107 mg/dL — ABNORMAL HIGH (ref 0–99)
Total CHOL/HDL Ratio: 4
Triglycerides: 84 mg/dL (ref 0.0–149.0)
VLDL: 16.8 mg/dL (ref 0.0–40.0)

## 2011-09-06 NOTE — Progress Notes (Signed)
   HPI:  51 Year-Old woman presenting for followup evaluation. The patient presented with non-ST elevation MI in may of this year. She underwent urgent cardiac catheterization and there was suspicion of a spontaneous dissection or ruptured plaque in an obtuse marginal branch. She was treated with overlapping drug-eluting stents. There was no other significant obstructive disease seen. Her left ventricular function was intact with an ejection fraction of 55%.  The patient is doing fairly well at present. She walks on the treadmill at least a few days per week and has no symptoms with 20 minutes of walking. She does have episodic dyspnea but this is been self-limited. She's had one episode of palpitations at rest. She's had no exertional chest pain or tightness.  Outpatient Encounter Prescriptions as of 09/06/2011  Medication Sig Dispense Refill  . aspirin EC 81 MG EC tablet Take 1 tablet (81 mg total) by mouth daily.      . fexofenadine (ALLEGRA) 180 MG tablet Take 180 mg by mouth daily.      Marland Kitchen ibuprofen (ADVIL,MOTRIN) 200 MG tablet Take 800 mg by mouth every 6 (six) hours as needed. For pain      . metoprolol tartrate (LOPRESSOR) 25 MG tablet Try to take Lopressor 1/4 tablet (6.25 mg) twice daily..If you still feel poorly with this, change to take Lopressor 25 mg 1/2 tablet (12.5 mg) at bedtime only.      . nitroGLYCERIN (NITROSTAT) 0.4 MG SL tablet Place 1 tablet (0.4 mg total) under the tongue every 5 (five) minutes as needed.  25 tablet  3  . OVER THE COUNTER MEDICATION 2 tablets. Herbal supplement for menopause      . OVER THE COUNTER MEDICATION Take 2 tablets by mouth daily. Herbal supplement for nerves      . simvastatin (ZOCOR) 40 MG tablet Take 1 tablet (40 mg total) by mouth every evening.  30 tablet  3  . ST JOHNS WORT PO Take 2 tablets by mouth daily.      . Ticagrelor (BRILINTA) 90 MG TABS tablet Take 1 tablet (90 mg total) by mouth 2 (two) times daily.  60 tablet  3    Allergies    Allergen Reactions  . Shellfish Allergy     Past Medical History  Diagnosis Date  . Myocardial infarction     ~15yrs ago  . Degenerative disc disease   . Cervical cancer   . NSTEMI (non-ST elevated myocardial infarction) 06/24/2011    likely spontaneous dissection/ruptured plaque in OM3 - PCI: 3 overlapping Promus DES to OM3, EF 55%  . CAD (coronary artery disease)     BP 119/75  Pulse 83  Ht 5\' 2"  (1.575 m)  Wt 173 lb (78.472 kg)  BMI 31.64 kg/m2  PHYSICAL EXAM: Pt is alert and oriented, NAD HEENT: normal Neck: JVP - normal, carotids 2+= without bruits Lungs: CTA bilaterally CV: RRR without murmur or gallop Abd: soft, NT, Positive BS, no hepatomegaly Ext: no C/C/E, distal pulses intact and equal Skin: warm/dry no rash  ASSESSMENT AND PLAN:

## 2011-09-06 NOTE — Patient Instructions (Addendum)
Your physician recommends that you have lab work today: LIPID and LIVER  Your physician has requested that you have an exercise tolerance test in 6 MONTHS (schedule with Tereso Newcomer PA-C). For further information please visit [email or web address] Please also follow instruction sheet, as given.  Your physician wants you to follow-up in: 1 YEAR with Dr Excell Seltzer.  You will receive a reminder letter in the mail two months in advance. If you don't receive a letter, please call our office to schedule the follow-up appointment.  Your physician recommends that you continue on your current medications as directed. Please refer to the Current Medication list given to you today.

## 2011-09-06 NOTE — Assessment & Plan Note (Signed)
The patient is on simvastatin 20 mg. Her HDL was low at 38. Her LDL was 83. She is having a lipid panel and LFTs drawn today. I discussed the Accelerate Trial with her and she would like to wait until she sees the results of today's lipid panel.

## 2011-09-06 NOTE — Assessment & Plan Note (Signed)
The patient is stable without anginal symptoms. She remains on dual antiplatelet therapy with aspirin and brilinta and should continue this for 12 months. Her medical program is appropriate with use of a beta blocker and statin drug. I would like her to do an exercise treadmill study in 6 months and we will try to schedule this with Tereso Newcomer who has seen her in the office. I would like to see her back in 12 months for followup.

## 2011-09-12 ENCOUNTER — Telehealth: Payer: Self-pay | Admitting: Cardiovascular Disease

## 2011-09-12 DIAGNOSIS — E7800 Pure hypercholesterolemia, unspecified: Secondary | ICD-10-CM

## 2011-09-12 MED ORDER — ATORVASTATIN CALCIUM 40 MG PO TABS: 40.0000 mg | ORAL_TABLET | Freq: Every day | ORAL | Status: DC

## 2011-09-12 NOTE — Telephone Encounter (Signed)
Fu call °Pt returning your call  °

## 2011-09-12 NOTE — Telephone Encounter (Signed)
Pt calling for blood work results

## 2011-09-12 NOTE — Telephone Encounter (Signed)
Pt aware of cholesterol results by phone.  The pt is not interested in Accelerate at this time.  I will make Dr Excell Seltzer aware of this information and see if he has any other orders.

## 2011-09-12 NOTE — Telephone Encounter (Signed)
Left message to call back  

## 2011-09-12 NOTE — Telephone Encounter (Signed)
Would change statin to lipitor 40 mg to try to reach goal LDL less than 100.

## 2011-09-12 NOTE — Telephone Encounter (Signed)
Left message for pt to call back  °

## 2011-09-20 NOTE — Telephone Encounter (Signed)
The pt should stop Simvastatin and start Atorvastatin (Rx already sent to the office).  The pt will need a lipid and liver rechecked on 12/05/11 (appt and orders are already in system).  Left message for pt to call back.

## 2011-09-25 ENCOUNTER — Telehealth: Payer: Self-pay | Admitting: Cardiovascular Disease

## 2011-09-25 MED ORDER — TICAGRELOR 90 MG PO TABS: 90.0000 mg | ORAL_TABLET | Freq: Two times a day (BID) | ORAL | Status: DC

## 2011-09-25 NOTE — Telephone Encounter (Signed)
Pt has been approved to get brilinta from the company but she needs a new Rx to get this and she needs this asap. Please call her and let her know what we are doing

## 2011-09-25 NOTE — Telephone Encounter (Signed)
Patient called back  She stated need a prescription fax to [facility].  Brilinta 90 mg twice a day 90 days supply and 3 refills faxed  to [phone]. Left pt a message to let her know.

## 2011-09-25 NOTE — Telephone Encounter (Signed)
Left pt a message to call back. 

## 2011-10-03 NOTE — Telephone Encounter (Signed)
I spoke with the pt and she has started an herbal vitamin for BP and cholesterol.  The pt said she has tried Lipitor in the past and does not want to try this medication again. The pt has also stopped her Simvastatin at this time.  The pt would like to just try her herbal vitamins and have labs rechecked in October as planned. I did make the pt aware that we are unsure how herbal medications can effect her prescription medications and she is aware of any risk involved.

## 2011-11-30 ENCOUNTER — Other Ambulatory Visit (INDEPENDENT_AMBULATORY_CARE_PROVIDER_SITE_OTHER): Payer: Self-pay

## 2011-11-30 DIAGNOSIS — E7800 Pure hypercholesterolemia, unspecified: Secondary | ICD-10-CM

## 2011-12-01 LAB — HEPATIC FUNCTION PANEL
ALT: 15 U/L (ref 0–35)
AST: 18 U/L (ref 0–37)
Albumin: 4 g/dL (ref 3.5–5.2)
Alkaline Phosphatase: 43 U/L (ref 39–117)
Bilirubin, Direct: 0.1 mg/dL (ref 0.0–0.3)
Total Bilirubin: 0.7 mg/dL (ref 0.3–1.2)
Total Protein: 6.9 g/dL (ref 6.0–8.3)

## 2011-12-01 LAB — LIPID PANEL
Cholesterol: 166 mg/dL (ref 0–200)
HDL: 34.7 mg/dL — ABNORMAL LOW (ref >39.00)
LDL Cholesterol: 119 mg/dL — ABNORMAL HIGH (ref 0–99)
Total CHOL/HDL Ratio: 5
Triglycerides: 62 mg/dL (ref 0.0–149.0)
VLDL: 12.4 mg/dL (ref 0.0–40.0)

## 2011-12-04 ENCOUNTER — Other Ambulatory Visit: Payer: Self-pay

## 2011-12-04 DIAGNOSIS — E7800 Pure hypercholesterolemia, unspecified: Secondary | ICD-10-CM

## 2011-12-04 MED ORDER — SIMVASTATIN 40 MG PO TABS: 40.0000 mg | ORAL_TABLET | Freq: Every day | ORAL | Status: DC

## 2011-12-05 ENCOUNTER — Other Ambulatory Visit: Payer: Self-pay

## 2012-01-10 ENCOUNTER — Telehealth: Payer: Self-pay | Admitting: Nurse Practitioner

## 2012-01-10 DIAGNOSIS — E7800 Pure hypercholesterolemia, unspecified: Secondary | ICD-10-CM

## 2012-01-10 NOTE — Telephone Encounter (Signed)
New Problem:    Patient called in wanting to speak with Lawson Fiscal because she is on simvastatin (ZOCOR) 40 MG tablet and is experiencing some side effects. Please call back

## 2012-01-10 NOTE — Telephone Encounter (Signed)
I spoke with the pt and over the last 2 weeks the pt has had dizziness, nausea and a loss of appetite.  The pt thinks this is related to Simvastatin.  At this time the pt will hold Simvastatin for 10 days and see if she notices any improvement in her symptoms. The pt will call our office after this trial off of Simvastatin.  We will make further recommendations about medications at that time.

## 2012-02-27 ENCOUNTER — Other Ambulatory Visit (INDEPENDENT_AMBULATORY_CARE_PROVIDER_SITE_OTHER): Payer: BC Managed Care – PPO

## 2012-02-27 DIAGNOSIS — E7800 Pure hypercholesterolemia, unspecified: Secondary | ICD-10-CM

## 2012-02-27 LAB — LIPID PANEL
Cholesterol: 180 mg/dL (ref 0–200)
HDL: 39.9 mg/dL (ref >39.00)
LDL Cholesterol: 125 mg/dL — ABNORMAL HIGH (ref 0–99)
Total CHOL/HDL Ratio: 5
Triglycerides: 74 mg/dL (ref 0.0–149.0)
VLDL: 14.8 mg/dL (ref 0.0–40.0)

## 2012-02-27 LAB — HEPATIC FUNCTION PANEL
ALT: 15 U/L (ref 0–35)
AST: 17 U/L (ref 0–37)
Albumin: 4.5 g/dL (ref 3.5–5.2)
Alkaline Phosphatase: 53 U/L (ref 39–117)
Bilirubin, Direct: 0.2 mg/dL (ref 0.0–0.3)
Total Bilirubin: 1.2 mg/dL (ref 0.3–1.2)
Total Protein: 7.1 g/dL (ref 6.0–8.3)

## 2012-03-01 ENCOUNTER — Other Ambulatory Visit: Payer: Self-pay

## 2012-03-01 ENCOUNTER — Telehealth: Payer: Self-pay | Admitting: Cardiovascular Disease

## 2012-03-01 ENCOUNTER — Encounter: Payer: Self-pay | Admitting: Cardiovascular Disease

## 2012-03-01 DIAGNOSIS — E7800 Pure hypercholesterolemia, unspecified: Secondary | ICD-10-CM

## 2012-03-01 NOTE — Telephone Encounter (Signed)
Pt rtn call re lab work, pls [phone]

## 2012-03-01 NOTE — Telephone Encounter (Signed)
I spoke with the pt and  

## 2012-03-01 NOTE — Telephone Encounter (Signed)
This encounter was created in error - please disregard.

## 2012-03-01 NOTE — Telephone Encounter (Signed)
Pt calling again to get lab results. 

## 2012-03-01 NOTE — Telephone Encounter (Signed)
Pt aware of lab results by phone. The pt has been off of Simvastatin since 01/10/12 due to side effects. The pt's side effects have not improved since holding simvastatin. At this time the pt will restart Simvastatin 40mg  daily. The pt will need a lipid and liver repeated in 3 months if she continues on this medication. The pt would like to call the office in 1 month and schedule these labs. The pt is aware that she needs to contact the office if she stops this medication again.

## 2012-03-01 NOTE — Telephone Encounter (Signed)
Pt talked with lauren today and has more questions

## 2012-03-11 ENCOUNTER — Telehealth: Payer: Self-pay | Admitting: Cardiovascular Disease

## 2012-03-11 ENCOUNTER — Encounter: Payer: Self-pay | Admitting: Family Medicine

## 2012-03-11 NOTE — Telephone Encounter (Signed)
The pt has tried Simvastatin and Atorvastatin in the past.  I will forward this message to Dr Excell Seltzer to advise what medication the pt can try for cholesterol management.

## 2012-03-11 NOTE — Telephone Encounter (Signed)
Left message to call back.  If the pt can tolerate Crestor then she will need repeat lipid and liver in 3 months.

## 2012-03-11 NOTE — Telephone Encounter (Signed)
New Problem:    Patient called in wanting to switch to a different medication because she is experiencing joint pain and total body soreness while on simvastatin (ZOCOR) 40 MG tablet.  Please call back.

## 2012-03-11 NOTE — Telephone Encounter (Signed)
Would try Crestor 5 mg daily. If unable to tolerate I would recommend discontinuing statin drugs as that would be her third agent. Recommend trial of coenzyme Q10 as well.  Tonny Bollman 03/11/2012

## 2012-03-15 NOTE — Telephone Encounter (Signed)
Left message for pt to call back  °

## 2012-03-25 ENCOUNTER — Other Ambulatory Visit: Payer: Self-pay

## 2012-03-25 NOTE — Telephone Encounter (Signed)
I spoke with the pt and she stopped taking Simvastatin on 03/22/12 due to muscle aches.  The pt will have a statin holiday for 2 weeks and then start Crestor 5mg  daily. I have placed a 2 week supply of samples at the front desk and the pt will take CoQ10 with Crestor. The pt will call the office and make [place] aware if she can take this medication.  If the pt can take Crestor then she will need a lipid and liver in 3 months.

## 2012-04-01 ENCOUNTER — Telehealth: Payer: Self-pay | Admitting: Cardiovascular Disease

## 2012-04-01 NOTE — Telephone Encounter (Signed)
New Problem     Pt stated she has been experiencing a lot of pain all over for a while. This weekend she stated the pain got very unbearable. She said her left side (from arm to neck) went numb and she felt very weak. Would like to speak to nurse.

## 2012-04-01 NOTE — Telephone Encounter (Signed)
I spoke with the pt and she is continuing to have complaints of muscle pain.  The pt has been off her statin for over a week.  The pt states she has had ongoing issues with numbness in her left arm but the symptoms last a few minutes and usually resolve.  On Saturday the pt developed weakness and left arm and neck tingling and discomfort. The pt denied chest pain.  The pt states she can hardly move to get dressed and it hurts to pick up a coffee cup.  I made the pt aware that her symptoms sound muscular/nerve related.  The pt did contact her PCP but they cannot see her today. I have reviewed our schedules and at this time we do not have any openings.  The pt plans on going to Urgent Care today for further evaluation. I did go ahead and schedule the pt to see a PA in the first available opening on 04/04/12. The pt has been taking ibuprofen and back aid for pain and she only has improvement in symptoms for 2 hours.  The pt's left arm discomfort is similar to what she had with MI but it is not as intense and she denies chest pain. The pt will contact our office after her evaluation at Urgent Care.

## 2012-04-04 ENCOUNTER — Ambulatory Visit (INDEPENDENT_AMBULATORY_CARE_PROVIDER_SITE_OTHER): Payer: BC Managed Care – PPO | Admitting: Physician Assistant

## 2012-04-04 ENCOUNTER — Encounter: Payer: Self-pay | Admitting: Cardiology

## 2012-04-04 ENCOUNTER — Encounter: Payer: Self-pay | Admitting: Physician Assistant

## 2012-04-04 VITALS — BP 108/74 | HR 99 | Ht 62.0 in | Wt 183.4 lb

## 2012-04-04 DIAGNOSIS — I209 Angina pectoris, unspecified: Secondary | ICD-10-CM

## 2012-04-04 DIAGNOSIS — E785 Hyperlipidemia, unspecified: Secondary | ICD-10-CM

## 2012-04-04 DIAGNOSIS — I208 Other forms of angina pectoris: Secondary | ICD-10-CM

## 2012-04-04 DIAGNOSIS — I2510 Atherosclerotic heart disease of native coronary artery without angina pectoris: Secondary | ICD-10-CM

## 2012-04-04 LAB — BASIC METABOLIC PANEL
BUN: 14 mg/dL (ref 6–23)
CO2: 26 mEq/L (ref 19–32)
Calcium: 9.5 mg/dL (ref 8.4–10.5)
Chloride: 108 mEq/L (ref 96–112)
Creatinine, Ser: 0.9 mg/dL (ref 0.4–1.2)
GFR: 72.49 mL/min (ref >60.00)
Glucose, Bld: 76 mg/dL (ref 70–99)
Potassium: 3.7 mEq/L (ref 3.5–5.1)
Sodium: 140 mEq/L (ref 135–145)

## 2012-04-04 LAB — CBC WITH DIFFERENTIAL/PLATELET
Basophils Absolute: 0 10*3/uL (ref 0.0–0.1)
Basophils Relative: 0.3 % (ref 0.0–3.0)
Eosinophils Absolute: 0.1 10*3/uL (ref 0.0–0.7)
Eosinophils Relative: 1.4 % (ref 0.0–5.0)
HCT: 40 % (ref 36.0–46.0)
Hemoglobin: 13.8 g/dL (ref 12.0–15.0)
Lymphocytes Relative: 22.4 % (ref 12.0–46.0)
Lymphs Abs: 1 10*3/uL (ref 0.7–4.0)
MCHC: 34.5 g/dL (ref 30.0–36.0)
MCV: 91.6 fl (ref 78.0–100.0)
Monocytes Absolute: 0.3 10*3/uL (ref 0.1–1.0)
Monocytes Relative: 6.9 % (ref 3.0–12.0)
Neutro Abs: 3 10*3/uL (ref 1.4–7.7)
Neutrophils Relative %: 69 % (ref 43.0–77.0)
Platelets: 212 10*3/uL (ref 150.0–400.0)
RBC: 4.37 Mil/uL (ref 3.87–5.11)
RDW: 13.2 % (ref 11.5–14.6)
WBC: 4.3 10*3/uL — ABNORMAL LOW (ref 4.5–10.5)

## 2012-04-04 LAB — PROTIME-INR
INR: 1 ratio (ref 0.8–1.0)
Prothrombin Time: 11 s (ref 10.2–12.4)

## 2012-04-04 MED ORDER — METOPROLOL TARTRATE 25 MG PO TABS: 12.5000 mg | ORAL_TABLET | Freq: Two times a day (BID) | ORAL | Status: DC

## 2012-04-04 MED ORDER — ROSUVASTATIN CALCIUM 5 MG PO TABS: 5.0000 mg | ORAL_TABLET | Freq: Every day | ORAL | Status: DC

## 2012-04-04 NOTE — H&P (Signed)
History and Physical   Date:  04/04/2012   ID:  Maria Bishop, DOB 1960/08/16, MRN [id number]  PCP:  No primary provider on file.  Primary Cardiologist:  Dr. Tonny Bollman     History of Present Illness: Maria Bishop is a 52 y.o. female who returns for evaluation of arm pain.  She has a reported hx of MI many years ago. She was admitted 06/2011 with a NSTEMI.  LHC 06/24/11: Likely spontaneous dissection/ruptured plaque in the OM3 which propagated back into the mid circumflex. No disease in the left main, LAD or RCA. EF 55%. PCI: overlapping Promus DES x3 to the OM3.  Last seen by Dr. Tonny Bollman in 7/13.  She was stable at that time and plan was to pursue an ETT 6 mos later (02/2012).   Since then she has had some myalgias presumably from Simvastatin and she was to try Crestor.    Patient notes significant left arm discomfort over the last several weeks.  This has not resolved or changed with discontinuation of her statin.  Symptoms are getting worse.  She had an episode 2 weeks ago while driving with left arm pain to her fingers.  She has felt fatigued.  She also notes development of chest pressure with exertion.  She will have symptoms with minimal activity at times. Notes DOE assoc with CP as well.  No syncope.  No orthopnea, PND, edema.  No arm pain with changes in position.  No CP related to meals.  She feels her chest symptoms are somewhat reminiscent of her prior angina.    Labs (5/13):  K 3.3, creatinine 0.65  Wt Readings from Last 3 Encounters:  09/06/11 173 lb (78.472 kg)  07/06/11 174 lb 1.9 oz (78.98 kg)  06/25/11 170 lb (77.111 kg)     Past Medical History  Diagnosis Date  . Myocardial infarction     ~14yrs ago  . Degenerative disc disease   . Cervical cancer   . NSTEMI (non-ST elevated myocardial infarction) 06/24/2011    likely spontaneous dissection/ruptured plaque in OM3 - PCI: 3 overlapping Promus DES to OM3, EF 55%  . CAD (coronary artery disease)      Current Outpatient Prescriptions  Medication Sig Dispense Refill  . aspirin EC 81 MG EC tablet Take 1 tablet (81 mg total) by mouth daily.      . fexofenadine (ALLEGRA) 180 MG tablet Take 180 mg by mouth daily.      Marland Kitchen ibuprofen (ADVIL,MOTRIN) 200 MG tablet Take 800 mg by mouth every 6 (six) hours as needed. For pain      . nitroGLYCERIN (NITROSTAT) 0.4 MG SL tablet Place 1 tablet (0.4 mg total) under the tongue every 5 (five) minutes as needed.  25 tablet  3  . OVER THE COUNTER MEDICATION 2 tablets. Herbal supplement for menopause      . ST JOHNS WORT PO Take 2 tablets by mouth daily.      . Ticagrelor (BRILINTA) 90 MG TABS tablet Take 1 tablet (90 mg total) by mouth 2 (two) times daily.  180 tablet  3   No current facility-administered medications for this visit.    Allergies:    Allergies  Allergen Reactions  . Shellfish Allergy     Social History:  The patient  reports that she has quit smoking. She does not have any smokeless tobacco history on file. She reports that  drinks alcohol. She reports that she does not use illicit drugs.   Family History:  The patient's family history includes Cancer in her other; Coronary artery disease in her mother; Diabetes in her other; and Hyperlipidemia in her other.   ROS:  Please see the history of present illness.      All other systems reviewed and negative.   PHYSICAL EXAM: VS:  BP 108/74  Pulse 99  Ht 5\' 2"  (1.575 m)  Wt 183 lb 6.4 oz (83.19 kg)  BMI 33.54 kg/m2 Well nourished, well developed, in no acute distress HEENT: normal Neck: no JVD Vascular: no carotid bruits Cardiac:  normal S1, S2; RRR; no murmur Chest:  No tenderness to palpation Lungs:  clear to auscultation bilaterally, no wheezing, rhonchi or rales Abd: soft, nontender, no hepatomegaly Ext: no edema Skin: warm and dry Neuro:  CNs 2-12 intact, no focal abnormalities noted  EKG:  NSR, HR 99, normal axis, NSSTTW changes     ASSESSMENT AND PLAN:  1. 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  Discussed proceeding with cardiac cath with the patient and she agrees to proceed.  Discussed with Dr. Tonny Bollman who agrees.  Risks and benefits of cardiac catheterization have been discussed with the patient.  These include bleeding, infection, kidney damage, stroke, heart attack, death.  The patient understands these risks and is willing to proceed. Plan with Dr. Excell Seltzer next week. Will add lopressor back at 12.5 mg bid.  Continue ASA, Brilinta.  She can resume her statin.  She knows to go to the ED if her symptoms worsen. 2. Coronary Artery Disease:  Proceed with cath as noted. 3. Hyperlipidemia:  Resume statin. 4. Disposition:  She knows to go to the ED if her symptoms change.   Signed, Tereso Newcomer, PA-C  8:54 AM 04/04/2012     Patient seen, examined. Available data reviewed. Agree with findings, assessment, and plan as outlined by Tereso Newcomer, PA-C. Pt with progressive CP symptoms, hx of probable coronary dissection and PCI. Plan cath and possible PCI today.  Tonny Bollman, M.D. 04/08/2012 11:04 AM

## 2012-04-04 NOTE — Patient Instructions (Addendum)
Your physician has requested that you have a cardiac catheterization LEFT HEART CATH. WITH DR. Excell Seltzer ON 04/08/2012 @ 11:30 IN [facility].. Cardiac catheterization is used to diagnose and/or treat various heart conditions. Doctors may recommend this procedure for a number of different reasons. The most common reason is to evaluate chest pain. Chest pain can be a symptom of coronary artery disease (CAD), and cardiac catheterization can show whether plaque is narrowing or blocking your heart's arteries. This procedure is also used to evaluate the valves, as well as measure the blood flow and oxygen levels in different parts of your heart. For further information please visit [email or web address] Please follow instruction sheet, as given.  Your physician recommends that you return for lab work in: TODAY BMET, CBC W/DIFF, PT/INR  START LOPRESSOR 25MG  1/2 TABLET BY MOUTH TWICE A DAY.  RESTART CRESTOR   YOU HAVE BEEN GIVEN A WORK NOTE TODAY.

## 2012-04-04 NOTE — Progress Notes (Signed)
[address], [address] [place], [place]  [address] Phone: [phone], Fax:  [phone]  Date:  04/04/2012   ID:  Maria Bishop, DOB Feb 06, 1961, MRN [id number]  PCP:  No primary provider on file.  Primary Cardiologist:  Dr. Tonny Bollman     History of Present Illness: Maria Bishop is a 52 y.o. female who returns for evaluation of arm pain.  She has a reported hx of MI many years ago. She was admitted 06/2011 with a NSTEMI.  LHC 06/24/11: Likely spontaneous dissection/ruptured plaque in the OM3 which propagated back into the mid circumflex. No disease in the left main, LAD or RCA. EF 55%. PCI: overlapping Promus DES x3 to the OM3.  Last seen by Dr. Tonny Bollman in 7/13.  She was stable at that time and plan was to pursue an ETT 6 mos later (02/2012).   Since then she has had some myalgias presumably from Simvastatin and she was to try Crestor.    Patient notes significant left arm discomfort over the last several weeks.  This has not resolved or changed with discontinuation of her statin.  Symptoms are getting worse.  She had an episode 2 weeks ago while driving with left arm pain to her fingers.  She has felt fatigued.  She also notes development of chest pressure with exertion.  She will have symptoms with minimal activity at times. Notes DOE assoc with CP as well.  No syncope.  No orthopnea, PND, edema.  No arm pain with changes in position.  No CP related to meals.  She feels her chest symptoms are somewhat reminiscent of her prior angina.    Labs (5/13):  K 3.3, creatinine 0.65  Wt Readings from Last 3 Encounters:  09/06/11 173 lb (78.472 kg)  07/06/11 174 lb 1.9 oz (78.98 kg)  06/25/11 170 lb (77.111 kg)     Past Medical History  Diagnosis Date  . Myocardial infarction     ~68yrs ago  . Degenerative disc disease   . Cervical cancer   . NSTEMI (non-ST elevated myocardial infarction) 06/24/2011    likely spontaneous dissection/ruptured plaque in OM3 -  PCI: 3 overlapping Promus DES to OM3, EF 55%  . CAD (coronary artery disease)     Current Outpatient Prescriptions  Medication Sig Dispense Refill  . aspirin EC 81 MG EC tablet Take 1 tablet (81 mg total) by mouth daily.      . fexofenadine (ALLEGRA) 180 MG tablet Take 180 mg by mouth daily.      Marland Kitchen ibuprofen (ADVIL,MOTRIN) 200 MG tablet Take 800 mg by mouth every 6 (six) hours as needed. For pain      . nitroGLYCERIN (NITROSTAT) 0.4 MG SL tablet Place 1 tablet (0.4 mg total) under the tongue every 5 (five) minutes as needed.  25 tablet  3  . OVER THE COUNTER MEDICATION 2 tablets. Herbal supplement for menopause      . ST JOHNS WORT PO Take 2 tablets by mouth daily.      . Ticagrelor (BRILINTA) 90 MG TABS tablet Take 1 tablet (90 mg total) by mouth 2 (two) times daily.  180 tablet  3   No current facility-administered medications for this visit.    Allergies:    Allergies  Allergen Reactions  . Shellfish Allergy     Social History:  The patient  reports that she has quit smoking. She does not have any smokeless tobacco history on file. She reports that  drinks alcohol. She  reports that she does not use illicit drugs.   Family History:  The patient's family history includes Cancer in her other; Coronary artery disease in her mother; Diabetes in her other; and Hyperlipidemia in her other.   ROS:  Please see the history of present illness.      All other systems reviewed and negative.   PHYSICAL EXAM: VS:  BP 108/74  Pulse 99  Ht 5\' 2"  (1.575 m)  Wt 183 lb 6.4 oz (83.19 kg)  BMI 33.54 kg/m2 Well nourished, well developed, in no acute distress HEENT: normal Neck: no JVD Vascular: no carotid bruits Cardiac:  normal S1, S2; RRR; no murmur Chest:  No tenderness to palpation Lungs:  clear to auscultation bilaterally, no wheezing, rhonchi or rales Abd: soft, nontender, no hepatomegaly Ext: no edema Skin: warm and dry Neuro:  CNs 2-12 intact, no focal abnormalities noted  EKG:   NSR, HR 99, normal axis, NSSTTW changes     ASSESSMENT AND PLAN:  1. 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  Discussed proceeding with cardiac cath with the patient and she agrees to proceed.  Discussed with Dr. Tonny Bollman who agrees.  Risks and benefits of cardiac catheterization have been discussed with the patient.  These include bleeding, infection, kidney damage, stroke, heart attack, death.  The patient understands these risks and is willing to proceed. Plan with Dr. Excell Seltzer next week. Will add lopressor back at 12.5 mg bid.  Continue ASA, Brilinta.  She can resume her statin.  She knows to go to the ED if her symptoms worsen. 2. Coronary Artery Disease:  Proceed with cath as noted. 3. Hyperlipidemia:  Resume statin. 4. Disposition:  She knows to go to the ED if her symptoms change.   [name], PA-C  8:54 AM 04/04/2012

## 2012-04-05 ENCOUNTER — Other Ambulatory Visit: Payer: Self-pay | Admitting: *Deleted

## 2012-04-05 ENCOUNTER — Telehealth: Payer: Self-pay | Admitting: *Deleted

## 2012-04-05 NOTE — Telephone Encounter (Signed)
pt notified about lab results today with verbal understanding  

## 2012-04-05 NOTE — Telephone Encounter (Signed)
Message copied by Tarri Fuller on Fri Apr 05, 2012 10:13 AM ------      Message from: [place], [place] T      Created: Thu Apr 04, 2012 10:23 PM       Labs ok for cath      Continue with current treatment plan.      Tereso Newcomer, PA-C  10:22 PM 04/04/2012 ------

## 2012-04-05 NOTE — Telephone Encounter (Signed)
Pt was evaluated by Tereso Newcomer PA-C on 04/04/12.

## 2012-04-08 ENCOUNTER — Encounter (HOSPITAL_BASED_OUTPATIENT_CLINIC_OR_DEPARTMENT_OTHER)
Admission: RE | Disposition: A | Discharge status: Home / Self Care | Payer: Self-pay | Source: Ambulatory Visit | Attending: Cardiovascular Disease

## 2012-04-08 ENCOUNTER — Inpatient Hospital Stay (HOSPITAL_BASED_OUTPATIENT_CLINIC_OR_DEPARTMENT_OTHER)
Admission: RE | Admit: 2012-04-08 | Discharge: 2012-04-08 | Disposition: A | Discharge status: Home / Self Care | Payer: BC Managed Care – PPO | Source: Ambulatory Visit | Attending: Cardiovascular Disease | Admitting: Cardiovascular Disease

## 2012-04-08 DIAGNOSIS — Z9861 Coronary angioplasty status: Secondary | ICD-10-CM | POA: Insufficient documentation

## 2012-04-08 DIAGNOSIS — I252 Old myocardial infarction: Secondary | ICD-10-CM | POA: Insufficient documentation

## 2012-04-08 DIAGNOSIS — I2510 Atherosclerotic heart disease of native coronary artery without angina pectoris: Secondary | ICD-10-CM

## 2012-04-08 DIAGNOSIS — M542 Cervicalgia: Secondary | ICD-10-CM | POA: Insufficient documentation

## 2012-04-08 DIAGNOSIS — I208 Other forms of angina pectoris: Secondary | ICD-10-CM

## 2012-04-08 DIAGNOSIS — R072 Precordial pain: Secondary | ICD-10-CM | POA: Insufficient documentation

## 2012-04-08 SURGERY — JV LEFT HEART CATHETERIZATION WITH CORONARY ANGIOGRAM

## 2012-04-08 MED ORDER — SODIUM CHLORIDE 0.9 % IV SOLN: 250.0000 mL | INTRAVENOUS | Status: DC | PRN

## 2012-04-08 MED ORDER — SODIUM CHLORIDE 0.9 % IJ SOLN: 3.0000 mL | Freq: Two times a day (BID) | INTRAMUSCULAR | Status: DC

## 2012-04-08 MED ORDER — SODIUM CHLORIDE 0.9 % IV SOLN: 1.0000 mL/kg/h | INTRAVENOUS | Status: DC

## 2012-04-08 MED ORDER — ONDANSETRON HCL 4 MG/2ML IJ SOLN: 4.0000 mg | Freq: Four times a day (QID) | INTRAMUSCULAR | Status: DC | PRN

## 2012-04-08 MED ORDER — ACETAMINOPHEN 325 MG PO TABS: 650.0000 mg | ORAL_TABLET | ORAL | Status: DC | PRN

## 2012-04-08 MED ORDER — SODIUM CHLORIDE 0.9 % IV SOLN
INTRAVENOUS | Status: DC
  Administered 2012-04-08: 10:00:00 via INTRAVENOUS

## 2012-04-08 MED ORDER — ASPIRIN 81 MG PO CHEW
324.0000 mg | CHEWABLE_TABLET | ORAL | Status: AC
  Administered 2012-04-08: 324 mg via ORAL

## 2012-04-08 MED ORDER — SODIUM CHLORIDE 0.9 % IJ SOLN: 3.0000 mL | INTRAMUSCULAR | Status: DC | PRN

## 2012-04-08 NOTE — Progress Notes (Signed)
Allen's test performed on right hand with positive results, spo2 94%. 

## 2012-04-08 NOTE — Interval H&P Note (Signed)
History and Physical Interval Note:  04/08/2012 11:05 AM  Maria Bishop  has presented today for surgery, with the diagnosis of cp  The various methods of treatment have been discussed with the patient and family. After consideration of risks, benefits and other options for treatment, the patient has consented to  Procedure(s): [facility] LEFT HEART CATHETERIZATION WITH CORONARY ANGIOGRAM (N/A) as a surgical intervention .  The patient's history has been reviewed, patient examined, no change in status, stable for surgery.  I have reviewed the patient's chart and labs.  Questions were answered to the patient's satisfaction.     Tonny Bollman

## 2012-04-08 NOTE — CV Procedure (Signed)
   Cardiac Catheterization Procedure Note  Name: Maria Bishop MRN: [id number] DOB: 05-01-1960  Procedure: Left Heart Cath, Selective Coronary Angiography, LV angiography  Indication: Substernal chest and neck pain, similar to prior ischemic symptoms. 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 She was treated with overlapping drug-eluting stents last year by Dr. Eldridge Dace. She presents today for cardiac catheterization and possible PCI considering her recurrent symptoms.   Procedural Details: The right wrist was prepped, draped, and anesthetized with 1% lidocaine. Using the modified Seldinger technique, a 5 French sheath was introduced into the right radial artery. 3 mg of verapamil was administered through the sheath, weight-based unfractionated heparin was administered intravenously. Standard Judkins catheters were used for selective coronary angiography and left ventriculography. Catheter exchanges were performed over an exchange length guidewire. There were no immediate procedural complications. A TR band was used for radial hemostasis at the completion of the procedure.  The patient was transferred to the post catheterization recovery area for further monitoring.  Procedural Findings: Hemodynamics: AO 107/72 with a mean of 90 LV 108/13  Coronary angiography: Coronary dominance: right  Left mainstem: The left main is short. It is widely patent.  Left anterior descending (LAD): The LAD is patent throughout its course. The distal LAD is small. The proximal mid LAD are widely patent. There is no significant obstructive disease throughout the distribution of the LAD.  Left circumflex (LCx): The left circumflex is large in caliber. The vessel is smooth throughout its course. The area of overlapping stents extending from the mid circumflex into the third OM branch are widely patent. There is very minor  irregularity at the proximal edge of the stent but no significant associated stenosis. The distal stented segment has mild diffuse in-stent restenosis estimated at 20-30%.  Right coronary artery (RCA): The RCA is dominant. The vessel is widely patent throughout. This is a moderate caliber vessel with a small diameter PDA and PLA. There are no significant stenoses present.  Left ventriculography: There is a focal area of very mild hypokinesis involving the distal inferior wall. The overall left ventricular ejection fraction is preserved with an estimated ejection fraction of 60%.  Final Conclusions:   1. Continued patency of the stented segment in the left circumflex with no significant obstructive disease noted. 2. Widely patent left main, LAD, and right coronary arteries 3. Mild segmental contraction abnormality left ventricle with preserved overall LV systolic function  Recommendations: Continued medical management of CAD.  Tonny Bollman 04/08/2012, 11:41 AM

## 2012-04-08 NOTE — OR Nursing (Signed)
Dr Excell Seltzer at bedside to discuss results and treatment  Plan with pt and family

## 2012-04-09 ENCOUNTER — Telehealth: Payer: Self-pay | Admitting: Cardiovascular Disease

## 2012-04-09 NOTE — Telephone Encounter (Signed)
New Problem:    Patient called in wanting to know how soon she could return to work .  Please call back.

## 2012-04-09 NOTE — Telephone Encounter (Signed)
Per Dr. Riley Kill, pt ok to return to work tomorrow with minimal use of cathed wrist.  Pt agreed.

## 2012-04-18 ENCOUNTER — Encounter: Payer: Self-pay | Admitting: Cardiovascular Disease

## 2012-04-18 ENCOUNTER — Ambulatory Visit (INDEPENDENT_AMBULATORY_CARE_PROVIDER_SITE_OTHER): Payer: BC Managed Care – PPO | Admitting: Cardiovascular Disease

## 2012-04-18 VITALS — BP 122/78 | HR 81 | Ht 61.0 in | Wt 185.2 lb

## 2012-04-18 DIAGNOSIS — I2510 Atherosclerotic heart disease of native coronary artery without angina pectoris: Secondary | ICD-10-CM

## 2012-04-18 NOTE — Progress Notes (Signed)
HPI:  52 year old woman presenting for followup evaluation. She has coronary artery disease and presented with a non-ST elevation infarction in 2013. She underwent stenting of the left circumflex at that time. She recently presented with recurrent chest pain and underwent cardiac catheterization. This demonstrated wide patency of her stent site in the left circumflex and no other obstructive CAD. She returns today for followup.  She has continued to have multiple aches and pains in the neck, shoulders, chest, legs, and arms. This is consistently related to exertion. In fact, it is generally at rest. She has no exertional chest pain or pressure. She has  "good days and bad days."   Outpatient Encounter Prescriptions as of 04/18/2012  Medication Sig Dispense Refill  . aspirin EC 81 MG EC tablet Take 1 tablet (81 mg total) by mouth daily.      . fexofenadine (ALLEGRA) 180 MG tablet Take 180 mg by mouth daily.      Marland Kitchen ibuprofen (ADVIL,MOTRIN) 200 MG tablet Take 800 mg by mouth every 6 (six) hours as needed. For pain      . nitroGLYCERIN (NITROSTAT) 0.4 MG SL tablet Place 1 tablet (0.4 mg total) under the tongue every 5 (five) minutes as needed.  25 tablet  3  . OVER THE COUNTER MEDICATION 2 tablets. Herbal supplement for menopause      . rosuvastatin (CRESTOR) 5 MG tablet Take 1 tablet (5 mg total) by mouth daily.  30 tablet  11  . [facility] JOHNS WORT PO Take 2 tablets by mouth daily.      . Ticagrelor (BRILINTA) 90 MG TABS tablet Take 1 tablet (90 mg total) by mouth 2 (two) times daily.  180 tablet  3  . [DISCONTINUED] metoprolol tartrate (LOPRESSOR) 25 MG tablet Take 0.5 tablets (12.5 mg total) by mouth 2 (two) times daily.  60 tablet  11   No facility-administered encounter medications on file as of 04/18/2012.    Allergies  Allergen Reactions  . Shellfish Allergy     Past Medical History  Diagnosis Date  . Myocardial infarction     ~81yrs ago  . Degenerative disc disease   . Cervical cancer     . NSTEMI (non-ST elevated myocardial infarction) 06/24/2011    likely spontaneous dissection/ruptured plaque in OM3 - PCI: 3 overlapping Promus DES to OM3, EF 55%  . CAD (coronary artery disease)     ROS: Negative except as per HPI  BP 122/78  Pulse 81  Ht 5\' 1"  (1.549 m)  Wt 84.006 kg (185 lb 3.2 oz)  BMI 35.01 kg/m2  PHYSICAL EXAM: Pt is alert and oriented, NAD HEENT: normal Neck: JVP - normal, carotids 2+= without bruits Lungs: CTA bilaterally CV: RRR without murmur or gallop Abd: soft, NT, Positive BS, no hepatomegaly Ext: no C/C/E, distal pulses intact and equal, her right radial site is clear  Skin: warm/dry no rash  EKG:  Normal sinus rhythm 81 beats per minute, within normal limits.  Cardiac Cath 04/08/12: Procedural Details: The right wrist was prepped, draped, and anesthetized with 1% lidocaine. Using the modified Seldinger technique, a 5 French sheath was introduced into the right radial artery. 3 mg of verapamil was administered through the sheath, weight-based unfractionated heparin was administered intravenously. Standard Judkins catheters were used for selective coronary angiography and left ventriculography. Catheter exchanges were performed over an exchange length guidewire. There were no immediate procedural complications. A TR band was used for radial hemostasis at the completion of the procedure. The patient  was transferred to the post catheterization recovery area for further monitoring.  Procedural Findings:  Hemodynamics:  AO 107/72 with a mean of 90  LV 108/13  Coronary angiography:  Coronary dominance: right  Left mainstem: The left main is short. It is widely patent.  Left anterior descending (LAD): The LAD is patent throughout its course. The distal LAD is small. The proximal mid LAD are widely patent. There is no significant obstructive disease throughout the distribution of the LAD.  Left circumflex (LCx): The left circumflex is large in caliber. The  vessel is smooth throughout its course. The area of overlapping stents extending from the mid circumflex into the third OM branch are widely patent. There is very minor irregularity at the proximal edge of the stent but no significant associated stenosis. The distal stented segment has mild diffuse in-stent restenosis estimated at 20-30%.  Right coronary artery (RCA): The RCA is dominant. The vessel is widely patent throughout. This is a moderate caliber vessel with a small diameter PDA and PLA. There are no significant stenoses present.  Left ventriculography: There is a focal area of very mild hypokinesis involving the distal inferior wall. The overall left ventricular ejection fraction is preserved with an estimated ejection fraction of 60%.  Final Conclusions:  1. Continued patency of the stented segment in the left circumflex with no significant obstructive disease noted.  2. Widely patent left main, LAD, and right coronary arteries  3. Mild segmental contraction abnormality left ventricle with preserved overall LV systolic function  Recommendations: Continued medical management of CAD.   ASSESSMENT AND PLAN: 1. Coronary artery disease, native vessel. The patient is stable. Her pain does not sound anginal in nature and her recent cardiac catheterization demonstrated no obstructive CAD. Will continue her same medical program with the exception of lipid-lowering, see discussion below.  2. Hyperlipidemia. I think her statin drug is contributing to her symptoms. Her symptoms are fairly disabling. I have recommended a holiday from Crestor for at least one month. Will repeat lipids and LFTs in one month and decide whether to restart her on a statin drug at that time.  Tonny Bollman 04/18/2012 10:29 AM

## 2012-04-18 NOTE — Patient Instructions (Addendum)
Your physician wants you to follow-up in:  6 months. You will receive a reminder letter in the mail two months in advance. If you don't receive a letter, please call our office to schedule the follow-up appointment.  Your physician has recommended you make the following change in your medication:  Stop Crestor for 1 month  Your physician recommends that you return for fasting lab work in:  1 month--Lipid profile--week of May 20, 2012

## 2012-05-21 ENCOUNTER — Other Ambulatory Visit (INDEPENDENT_AMBULATORY_CARE_PROVIDER_SITE_OTHER): Payer: BC Managed Care – PPO

## 2012-05-21 DIAGNOSIS — I2510 Atherosclerotic heart disease of native coronary artery without angina pectoris: Secondary | ICD-10-CM

## 2012-05-21 LAB — LIPID PANEL
Cholesterol: 163 mg/dL (ref 0–200)
HDL: 29.8 mg/dL — ABNORMAL LOW (ref >39.00)
LDL Cholesterol: 116 mg/dL — ABNORMAL HIGH (ref 0–99)
Total CHOL/HDL Ratio: 5
Triglycerides: 84 mg/dL (ref 0.0–149.0)
VLDL: 16.8 mg/dL (ref 0.0–40.0)

## 2012-06-04 ENCOUNTER — Encounter: Payer: Self-pay | Admitting: Cardiovascular Disease

## 2012-06-04 NOTE — Telephone Encounter (Signed)
New problem   Pt returning your call concerning Lab work results. Please call pt

## 2012-06-04 NOTE — Telephone Encounter (Signed)
This encounter was created in error - please disregard.

## 2012-06-06 ENCOUNTER — Encounter: Payer: Self-pay | Admitting: Family Medicine

## 2012-06-06 ENCOUNTER — Ambulatory Visit (INDEPENDENT_AMBULATORY_CARE_PROVIDER_SITE_OTHER): Payer: BC Managed Care – PPO | Admitting: Family Medicine

## 2012-06-06 VITALS — BP 120/80 | HR 94 | Temp 98.5°F | Ht 62.25 in | Wt 181.8 lb

## 2012-06-06 DIAGNOSIS — C539 Malignant neoplasm of cervix uteri, unspecified: Secondary | ICD-10-CM

## 2012-06-06 DIAGNOSIS — I2510 Atherosclerotic heart disease of native coronary artery without angina pectoris: Secondary | ICD-10-CM

## 2012-06-06 DIAGNOSIS — Z1331 Encounter for screening for depression: Secondary | ICD-10-CM

## 2012-06-06 DIAGNOSIS — F418 Other specified anxiety disorders: Secondary | ICD-10-CM | POA: Insufficient documentation

## 2012-06-06 DIAGNOSIS — F341 Dysthymic disorder: Secondary | ICD-10-CM

## 2012-06-06 DIAGNOSIS — E785 Hyperlipidemia, unspecified: Secondary | ICD-10-CM

## 2012-06-06 MED ORDER — CITALOPRAM HYDROBROMIDE 20 MG PO TABS: 20.0000 mg | ORAL_TABLET | Freq: Every day | ORAL | Status: DC

## 2012-06-06 NOTE — Patient Instructions (Addendum)
Schedule your physical w/ pap in 1 month Start the Celexa daily for depression STOP the [employer] John's Wort Multivitamin daily and Calcium + Vit D We'll call you with your mammo appt Call with any questions or concerns Think of [place] as your home base Welcome!  We're glad to have you!

## 2012-06-06 NOTE — Progress Notes (Signed)
  Subjective:    Patient ID: Maria Bishop, female    DOB: 09/27/1960, 52 y.o.   MRN: [id number]  HPI New to establish.  Cards- Excell Seltzer.  No previous PCP.  No Mammo.  CAD- pt w/ hx of NSTEMI x2.  Currently on Crestor holiday due to severe myalgias.  sxs have improved in the month since stopping med.  LDL is now 116 (as of 4/15)  Is working on healthy diet and regular exercise.  Previously on beta blocker- stopped due to normal BP and HR  Hyperlipidemia- see above  Cervical Cancer- dx'd at age 32.  Had 'freezing' first and then had a recurrence 27 yrs ago and had 'burning'.  Last pap 6 yrs ago- normal.  Depression- pt feels sxs are hormone related.  Also feels 2nd MI plays a role.  Frequently tearful, quick to anger, trouble both falling and staying asleep.  Taking OTC sleep aid.  No change in appetite.  Good, supportive boyfriend.  Hx of anxiety/depression.     Review of Systems For ROS see HPI     Objective:   Physical Exam  Vitals reviewed. Constitutional: She is oriented to person, place, and time. She appears well-developed and well-nourished. No distress.  HENT:  Head: Normocephalic and atraumatic.  Eyes: Conjunctivae and EOM are normal. Pupils are equal, round, and reactive to light.  Neck: Normal range of motion. Neck supple. No thyromegaly present.  Cardiovascular: Normal rate, regular rhythm, normal heart sounds and intact distal pulses.   No murmur heard. Pulmonary/Chest: Effort normal and breath sounds normal. No respiratory distress.  Abdominal: Soft. She exhibits no distension. There is no tenderness.  Musculoskeletal: She exhibits no edema.  Lymphadenopathy:    She has no cervical adenopathy.  Neurological: She is alert and oriented to person, place, and time.  Skin: Skin is warm and dry.  Psychiatric: She has a normal mood and affect. Her behavior is normal.          Assessment & Plan:

## 2012-06-07 ENCOUNTER — Telehealth: Payer: Self-pay | Admitting: General Practice

## 2012-06-07 NOTE — Telephone Encounter (Signed)
Pt.notified

## 2012-06-07 NOTE — Telephone Encounter (Signed)
Pt called stating that she had received an Rx for Celexa at her recent appt. Has a question regarding this med. States she has stopped [employer] and wants to know how long she should wait before beginning the medication? Also is it best to take the med in the morning or evening?

## 2012-06-07 NOTE — Telephone Encounter (Signed)
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 Celexa is ok to take any time that is convenient for pt- AM, PM. I recommend pt take w/ food to avoid any nausea.

## 2012-06-09 NOTE — Assessment & Plan Note (Signed)
New to provider.  Pt w/ hx of this at young age.  Overdue on pap- pt plans to schedule.  Last few paps were normal.  Stressed importance of following regularly.  Pt in agreement.

## 2012-06-09 NOTE — Assessment & Plan Note (Signed)
New to provider, ongoing for pt.  S/p NSTEMI x2.  Following regularly w/ cards.  Not currently on beta blocker due to normal BP and HR.  Currently on statin holiday due to severe myalgias.  Will continue to follow along and assist as able.

## 2012-06-09 NOTE — Assessment & Plan Note (Signed)
New to provider.  Chronic problem for pt.  Currently on statin holiday due to severe myalgias.  Pt is attempting to control w/ healthy diet and regular exercise.  Discussed anti-inflammatory property of statins and given her hx of MI x2 it would be best to be on low dose if able to tolerate.  If pt unable to make successful switch to Pravastatin may need to consider Zetia (although this doesn't have anti-inflammatory properties)

## 2012-06-09 NOTE — Assessment & Plan Note (Signed)
New.  Pt has hx of similar.  Feels she has relapsed s/p 2nd MI.  Will start low dose med and follow closely.  Reviewed supportive care and red flags that should prompt return.  Pt expressed understanding and is in agreement w/ plan.

## 2012-06-26 ENCOUNTER — Telehealth: Payer: Self-pay | Admitting: General Practice

## 2012-06-26 MED ORDER — CITALOPRAM HYDROBROMIDE 40 MG PO TABS: 40.0000 mg | ORAL_TABLET | Freq: Every day | ORAL | Status: DC

## 2012-06-26 NOTE — Telephone Encounter (Signed)
Ok to increase to 40mg  daily- please send new script, #30, 3 refills

## 2012-06-26 NOTE — Telephone Encounter (Signed)
Tried to call pt to inform. LMOVM to return call. Went ahead and filled medication and updated chart.

## 2012-06-26 NOTE — Telephone Encounter (Signed)
Pt called stating that at her last OV she had been placed on Celexa 20mg . Per Dr. Beverely Low she ws told that we might need to increase the Celexa. Pt states that she feels as though it needs to be increased but did not know if this had to wait until her next appt with you on 07-19-12 or can this be done sooner because she is due for a refill. Please advise.

## 2012-07-15 ENCOUNTER — Other Ambulatory Visit: Payer: Self-pay

## 2012-07-15 MED ORDER — TICAGRELOR 90 MG PO TABS: 90.0000 mg | ORAL_TABLET | Freq: Two times a day (BID) | ORAL | Status: DC

## 2012-07-15 NOTE — Telephone Encounter (Signed)
..   Requested Prescriptions   Signed Prescriptions Disp Refills  . Ticagrelor (BRILINTA) 90 MG TABS tablet 180 tablet 3    Sig: Take 1 tablet (90 mg total) by mouth 2 (two) times daily.    Authorizing Provider: Tonny Bollman    Ordering User: Christella Hartigan, Lylian [place] Judie Petit

## 2012-07-19 ENCOUNTER — Encounter: Payer: Self-pay | Admitting: Family Medicine

## 2012-07-19 ENCOUNTER — Other Ambulatory Visit (HOSPITAL_COMMUNITY)
Admission: RE | Admit: 2012-07-19 | Discharge: 2012-07-19 | Disposition: A | Discharge status: Home / Self Care | Payer: BC Managed Care – PPO | Source: Ambulatory Visit | Attending: Family Medicine | Admitting: Family Medicine

## 2012-07-19 ENCOUNTER — Ambulatory Visit (INDEPENDENT_AMBULATORY_CARE_PROVIDER_SITE_OTHER): Payer: BC Managed Care – PPO | Admitting: Family Medicine

## 2012-07-19 VITALS — BP 102/80 | HR 92 | Temp 98.4°F | Ht 62.25 in | Wt 184.8 lb

## 2012-07-19 DIAGNOSIS — Z124 Encounter for screening for malignant neoplasm of cervix: Secondary | ICD-10-CM | POA: Insufficient documentation

## 2012-07-19 DIAGNOSIS — Z1231 Encounter for screening mammogram for malignant neoplasm of breast: Secondary | ICD-10-CM

## 2012-07-19 DIAGNOSIS — Z0000 Encounter for general adult medical examination without abnormal findings: Secondary | ICD-10-CM | POA: Insufficient documentation

## 2012-07-19 DIAGNOSIS — Z01419 Encounter for gynecological examination (general) (routine) without abnormal findings: Secondary | ICD-10-CM

## 2012-07-19 LAB — CBC WITH DIFFERENTIAL/PLATELET
Basophils Absolute: 0 10*3/uL (ref 0.0–0.1)
Basophils Relative: 0.3 % (ref 0.0–3.0)
Eosinophils Absolute: 0.1 10*3/uL (ref 0.0–0.7)
Eosinophils Relative: 1.7 % (ref 0.0–5.0)
HCT: 40.5 % (ref 36.0–46.0)
Hemoglobin: 14 g/dL (ref 12.0–15.0)
Lymphocytes Relative: 22.4 % (ref 12.0–46.0)
Lymphs Abs: 1 10*3/uL (ref 0.7–4.0)
MCHC: 34.6 g/dL (ref 30.0–36.0)
MCV: 93.5 fl (ref 78.0–100.0)
Monocytes Absolute: 0.3 10*3/uL (ref 0.1–1.0)
Monocytes Relative: 6.7 % (ref 3.0–12.0)
Neutro Abs: 2.9 10*3/uL (ref 1.4–7.7)
Neutrophils Relative %: 68.9 % (ref 43.0–77.0)
Platelets: 223 10*3/uL (ref 150.0–400.0)
RBC: 4.33 Mil/uL (ref 3.87–5.11)
RDW: 14.5 % (ref 11.5–14.6)
WBC: 4.3 10*3/uL — ABNORMAL LOW (ref 4.5–10.5)

## 2012-07-19 LAB — HEPATIC FUNCTION PANEL
ALT: 15 U/L (ref 0–35)
AST: 16 U/L (ref 0–37)
Albumin: 4.1 g/dL (ref 3.5–5.2)
Alkaline Phosphatase: 54 U/L (ref 39–117)
Bilirubin, Direct: 0 mg/dL (ref 0.0–0.3)
Total Bilirubin: 0.8 mg/dL (ref 0.3–1.2)
Total Protein: 6.7 g/dL (ref 6.0–8.3)

## 2012-07-19 LAB — TSH: TSH: 1.29 u[IU]/mL (ref 0.35–5.50)

## 2012-07-19 LAB — BASIC METABOLIC PANEL
BUN: 16 mg/dL (ref 6–23)
CO2: 27 mEq/L (ref 19–32)
Calcium: 9.1 mg/dL (ref 8.4–10.5)
Chloride: 108 mEq/L (ref 96–112)
Creatinine, Ser: 0.7 mg/dL (ref 0.4–1.2)
GFR: 88.43 mL/min (ref >60.00)
Glucose, Bld: 75 mg/dL (ref 70–99)
Potassium: 3.9 mEq/L (ref 3.5–5.1)
Sodium: 138 mEq/L (ref 135–145)

## 2012-07-19 NOTE — Assessment & Plan Note (Signed)
Pt's PE WNL.  Overdue for mammo.  Check labs.  Anticipatory guidance provided.

## 2012-07-19 NOTE — Patient Instructions (Addendum)
We'll notify you of your lab results and make any changes if needed We'll call you with your mammogram appt Keep up the good work!  You look great! Call with any questions or concerns Have a great summer!

## 2012-07-19 NOTE — Progress Notes (Signed)
  Subjective:    Patient ID: Maria Bishop, female    DOB: Sep 23, 1960, 52 y.o.   MRN: [id number]  HPI CPE- no concerns.  Due for pap and mammo.   Review of Systems Patient reports no vision/ hearing changes, adenopathy, fever, weight change,  persistant/recurrent hoarseness , swallowing issues, chest pain, palpitations, edema, persistant/recurrent cough, hemoptysis, dyspnea (rest/exertional/paroxysmal nocturnal), gastrointestinal bleeding (melena, rectal bleeding), abdominal pain, significant heartburn, bowel changes, GU symptoms (dysuria, hematuria, incontinence), Gyn symptoms (abnormal  bleeding, pain),  syncope, focal weakness, memory loss, numbness & tingling, skin/hair/nail changes, abnormal bruising or bleeding, anxiety, or depression.     Objective:   Physical Exam  General Appearance:    Alert, cooperative, no distress, appears stated age  Head:    Normocephalic, without obvious abnormality, atraumatic  Eyes:    PERRL, conjunctiva/corneas clear, EOM's intact, fundi    benign, both eyes  Ears:    Normal TM's and external ear canals, both ears  Nose:   Nares normal, septum midline, mucosa normal, no drainage    or sinus tenderness  Throat:   Lips, mucosa, and tongue normal; teeth and gums normal  Neck:   Supple, symmetrical, trachea midline, no adenopathy;    Thyroid: no enlargement/tenderness/nodules  Back:     Symmetric, no curvature, ROM normal, no CVA tenderness  Lungs:     Clear to auscultation bilaterally, respirations unlabored  Chest Wall:    No tenderness or deformity   Heart:    Regular rate and rhythm, S1 and S2 normal, no murmur, rub   or gallop  Breast Exam:    No tenderness, masses, or nipple abnormality  Abdomen:     Soft, non-tender, bowel sounds active all four quadrants,    no masses, no organomegaly  Genitalia:    External genitalia normal, cervix normal in appearance, no CMT, uterus in normal size and position, adnexa w/out mass or tenderness, mucosa pink  and moist, no lesions or discharge present  Rectal:    Normal external appearance  Extremities:   Extremities normal, atraumatic, no cyanosis or edema.  5 cm lipoma on R anterior lower leg  Pulses:   2+ and symmetric all extremities  Skin:   Skin color, texture, turgor normal, no rashes or lesions  Lymph nodes:   Cervical, supraclavicular, and axillary nodes normal  Neurologic:   CNII-XII intact, normal strength, sensation and reflexes    throughout          Assessment & Plan:

## 2012-07-19 NOTE — Assessment & Plan Note (Signed)
Pap collected. 

## 2012-07-24 LAB — VITAMIN D 1,25 DIHYDROXY
Vitamin D 1, 25 (OH)2 Total: 45 pg/mL (ref 18–72)
Vitamin D2 1, 25 (OH)2: <8 pg/mL
Vitamin D3 1, 25 (OH)2: 45 pg/mL

## 2012-07-25 ENCOUNTER — Other Ambulatory Visit: Payer: Self-pay | Admitting: Physician Assistant

## 2012-08-05 ENCOUNTER — Encounter: Payer: Self-pay | Admitting: Family Medicine

## 2012-08-05 NOTE — Telephone Encounter (Signed)
Patient has been on Celexa since 06/26/2012. Spoke with patient and she stated that she felt like the medication is doing a good job but she has days (like today) that all she wants to do is cry. Please advise. GF///RN

## 2012-08-08 ENCOUNTER — Ambulatory Visit
Admission: RE | Admit: 2012-08-08 | Discharge: 2012-08-08 | Disposition: A | Discharge status: Home / Self Care | Payer: BC Managed Care – PPO | Source: Ambulatory Visit | Attending: Family Medicine | Admitting: Family Medicine

## 2012-08-08 DIAGNOSIS — Z1231 Encounter for screening mammogram for malignant neoplasm of breast: Secondary | ICD-10-CM

## 2012-09-11 ENCOUNTER — Telehealth: Payer: Self-pay | Admitting: Cardiovascular Disease

## 2012-09-11 DIAGNOSIS — E7800 Pure hypercholesterolemia, unspecified: Secondary | ICD-10-CM

## 2012-09-11 NOTE — Telephone Encounter (Signed)
New Prob    Pt states she is due a lab appt// No orders to do so, please assist.

## 2012-09-11 NOTE — Telephone Encounter (Signed)
The pt is due for a lipid panel (order in system) and follow-up with Dr Excell Seltzer. Left message on machine for pt to contact the office.

## 2012-09-23 NOTE — Telephone Encounter (Signed)
Appointments have been arranged.

## 2012-09-26 ENCOUNTER — Telehealth: Payer: Self-pay | Admitting: Cardiovascular Disease

## 2012-09-26 NOTE — Telephone Encounter (Signed)
I spoke with the pt and she was diagnosed with plantar fascitis last week.  The pt was given steriods and meloxicam for treatment. The pt has completed steriods but was instructed to take meloxicam for 30 days.  The pt thinks she is having side effects from meloxicam: break through bleeding, SOB, heavy feeling in chest.   The pt would like to know if she should stop meloxicam.  I told her that if she felt side effects were coming from this medication then would stop. I did instruct the pt that she can take a short term course of ibuprofen if needed. The pt did not want to consult the MD who prescribed medications and wanted our office to answer her questions. The pt will contact our office with further questions or concerns.

## 2012-09-26 NOTE — Telephone Encounter (Signed)
New problem   Pt have question about her taking the Meloxicam. Please call pt

## 2012-09-26 NOTE — Telephone Encounter (Signed)
Left message to call back  

## 2012-10-27 ENCOUNTER — Other Ambulatory Visit: Payer: Self-pay | Admitting: Family Medicine

## 2012-10-28 ENCOUNTER — Telehealth: Payer: Self-pay | Admitting: General Practice

## 2012-10-28 NOTE — Telephone Encounter (Signed)
If worsening before Wednesday, can see another provider

## 2012-10-28 NOTE — Telephone Encounter (Signed)
Spoke with pt she advised that she had a rash a week ago seen in urgent care for this. States that she is still having severe itching on both of her arms (no redness or rash). However pt states that before the itching starts her arms hurt, like they fell asleep. Pt placed on Schedule for Wednesday as this was Tabori's earliest appt.

## 2012-10-28 NOTE — Telephone Encounter (Signed)
Rx filled and sent to [facility].

## 2012-10-29 NOTE — Telephone Encounter (Signed)
Noted. Will speak with pt.

## 2012-10-30 ENCOUNTER — Other Ambulatory Visit (INDEPENDENT_AMBULATORY_CARE_PROVIDER_SITE_OTHER): Payer: BC Managed Care – PPO

## 2012-10-30 ENCOUNTER — Ambulatory Visit: Payer: BC Managed Care – PPO | Admitting: Family Medicine

## 2012-10-30 DIAGNOSIS — E7800 Pure hypercholesterolemia, unspecified: Secondary | ICD-10-CM

## 2012-10-30 LAB — LIPID PANEL
Cholesterol: 199 mg/dL (ref 0–200)
HDL: 37.7 mg/dL — ABNORMAL LOW (ref >39.00)
LDL Cholesterol: 145 mg/dL — ABNORMAL HIGH (ref 0–99)
Total CHOL/HDL Ratio: 5
Triglycerides: 84 mg/dL (ref 0.0–149.0)
VLDL: 16.8 mg/dL (ref 0.0–40.0)

## 2012-11-06 ENCOUNTER — Encounter: Payer: Self-pay | Admitting: Cardiovascular Disease

## 2012-11-06 ENCOUNTER — Ambulatory Visit (INDEPENDENT_AMBULATORY_CARE_PROVIDER_SITE_OTHER): Payer: BC Managed Care – PPO | Admitting: Cardiovascular Disease

## 2012-11-06 VITALS — BP 124/80 | HR 79 | Wt 195.0 lb

## 2012-11-06 DIAGNOSIS — I2510 Atherosclerotic heart disease of native coronary artery without angina pectoris: Secondary | ICD-10-CM

## 2012-11-06 DIAGNOSIS — E7800 Pure hypercholesterolemia, unspecified: Secondary | ICD-10-CM

## 2012-11-06 LAB — HM MAMMOGRAPHY: HM Mammogram: NORMAL

## 2012-11-06 MED ORDER — EZETIMIBE 10 MG PO TABS: 10.0000 mg | ORAL_TABLET | Freq: Every day | ORAL | Status: DC

## 2012-11-06 NOTE — Patient Instructions (Addendum)
Your physician has recommended you make the following change in your medication: STOP Brilinta, START Zetia 10mg  take one by mouth daily  You have been referred to Lipid Clinic.   Your physician wants you to follow-up in: 6 MONTHS with Dr Excell Seltzer.  You will receive a reminder letter in the mail two months in advance. If you don't receive a letter, please call our office to schedule the follow-up appointment.

## 2012-11-06 NOTE — Progress Notes (Signed)
    HPI:   52 year old woman presenting for followup evaluation. The patient is followed for coronary artery disease. She presented with a non-ST elevation infarction in 2013 and underwent stenting of the left circumflex at that time. She had a repeat heart catheterization earlier this year demonstrating patency of her stent site. She had myalgias with Crestor and simvastatin. She is currently on a drug holiday and followup lipids showed cholesterol of 199, triglycerides 84, HDL 38, and LDL 145.  The patient feels well. Her myalgias are completely resolved. She denies chest pain, chest pressure, shortness of breath, or palpitations. She's been taking her brilinta only once daily. She is active but not engaged in regular exercise. She has no exertional symptoms.  Outpatient Encounter Prescriptions as of 11/06/2012  Medication Sig Dispense Refill  . citalopram (CELEXA) 40 MG tablet TAKE ONE TABLET BY MOUTH ONCE DAILY  30 tablet  3  . fexofenadine (ALLEGRA) 180 MG tablet Take 180 mg by mouth daily.      Marland Kitchen ibuprofen (ADVIL,MOTRIN) 200 MG tablet Take 800 mg by mouth every 6 (six) hours as needed. For pain      . nitroGLYCERIN (NITROSTAT) 0.4 MG SL tablet Place 1 tablet (0.4 mg total) under the tongue every 5 (five) minutes as needed.  25 tablet  3  . Ticagrelor (BRILINTA) 90 MG TABS tablet Take 90 mg by mouth daily.       . [DISCONTINUED] BRILINTA 90 MG TABS tablet TAKE ONE TABLET BY MOUTH TWICE DAILY  180 tablet  1  . [DISCONTINUED] Multiple Vitamin (MULTIVITAMIN) tablet Take 1 tablet by mouth daily.      . [DISCONTINUED] OVER THE COUNTER MEDICATION 2 tablets. Herbal supplement for menopause       No facility-administered encounter medications on file as of 11/06/2012.    Allergies  Allergen Reactions  . Shellfish Allergy     Past Medical History  Diagnosis Date  . Myocardial infarction     ~15yrs ago  . Degenerative disc disease   . Cervical cancer   . NSTEMI (non-ST elevated myocardial  infarction) 06/24/2011    likely spontaneous dissection/ruptured plaque in OM3 - PCI: 3 overlapping Promus DES to OM3, EF 55%  . CAD (coronary artery disease)     ROS: Negative except as per HPI  BP 124/80  Pulse 79  Wt 88.451 kg (195 lb)  BMI 35.39 kg/m2  PHYSICAL EXAM: Pt is alert and oriented, pleasant overweight woman in NAD HEENT: normal Neck: JVP - normal, carotids 2+= without bruits Lungs: CTA bilaterally CV: RRR without murmur or gallop Abd: soft, NT, Positive BS, no hepatomegaly Ext: no C/C/E, distal pulses intact and equal Skin: warm/dry no rash  ASSESSMENT AND PLAN: 1. Coronary atherosclerosis, native vessel. The patient is stable without anginal symptoms. She is out greater than 12 months from her initial event. Per guideline recommendations will stop brilinta. She should remain on aspirin 81 mg.  2. Hyperlipidemia. Her recent lipids were reviewed. He cannot tolerate statin drugs even at low dose. I am going to start her on zetia 10 mg daily and refer her to the lipid clinic for further management and followup. We had extensive discussion about lifestyle modification and the importance of a regular exercise program with focus on weight loss.  For followup I will see her back in 6 months.  Tonny Bollman 11/06/2012 2:29 PM

## 2012-11-12 ENCOUNTER — Ambulatory Visit (INDEPENDENT_AMBULATORY_CARE_PROVIDER_SITE_OTHER): Payer: BC Managed Care – PPO | Admitting: *Deleted

## 2012-11-12 DIAGNOSIS — M722 Plantar fascial fibromatosis: Secondary | ICD-10-CM

## 2012-11-12 NOTE — Progress Notes (Signed)
Dispensed patient's orthotics with oral and written instructions for wearing. Patient concerned about these orthotics fitting into her clog type shoes. I did advise the orthotics dispensed today would be more suited for a sneaker and that once she becomes accustomed to wearing these that she may also get another pair for her clogs. She states that she would keep that in mind and let us know. Patient will follow up with Dr. Al Corpus in 1 month for an orthotic check.

## 2012-11-12 NOTE — Patient Instructions (Addendum)

## 2012-12-10 ENCOUNTER — Ambulatory Visit: Payer: BC Managed Care – PPO | Admitting: Podiatry

## 2012-12-12 ENCOUNTER — Other Ambulatory Visit: Payer: Self-pay

## 2012-12-18 ENCOUNTER — Telehealth: Payer: Self-pay | Admitting: Cardiovascular Disease

## 2012-12-18 NOTE — Telephone Encounter (Signed)
New message     Feeling funny at work (jittery)--took bp--107/80 pulse 125---- pls advise.

## 2012-12-18 NOTE — Telephone Encounter (Signed)
I spoke with the pt and she was sitting at her desk and suddenly felt jittery and "quivering inside".  The pt checked her vitals and BP was 107/80, pulse 125. The pt waited a few minutes and then rechecked BP 131/84, pulse 131. The pt does have Metoprolol which she carries in her pocket book and she took a 25 mg tablet.  Per the pt her BP has improved and her pulse is down to 103. The pt said she is under a lot of stress and denies any excess caffeine consumption.  The pt does not have availability to have an EKG performed in her office.  I instructed the pt to continue to monitor her symptoms and if she continues to have episodes then she should contact our office for an EKG or possible monitor. Pt agreed with plan.

## 2013-01-08 ENCOUNTER — Other Ambulatory Visit: Payer: BC Managed Care – PPO

## 2013-01-09 ENCOUNTER — Ambulatory Visit (INDEPENDENT_AMBULATORY_CARE_PROVIDER_SITE_OTHER): Payer: BC Managed Care – PPO | Admitting: *Deleted

## 2013-01-09 DIAGNOSIS — I2510 Atherosclerotic heart disease of native coronary artery without angina pectoris: Secondary | ICD-10-CM

## 2013-01-09 DIAGNOSIS — E785 Hyperlipidemia, unspecified: Secondary | ICD-10-CM

## 2013-01-09 LAB — HEPATIC FUNCTION PANEL
ALT: 19 U/L (ref 0–35)
AST: 18 U/L (ref 0–37)
Albumin: 4.1 g/dL (ref 3.5–5.2)
Alkaline Phosphatase: 45 U/L (ref 39–117)
Bilirubin, Direct: 0 mg/dL (ref 0.0–0.3)
Total Bilirubin: 0.8 mg/dL (ref 0.3–1.2)
Total Protein: 6.7 g/dL (ref 6.0–8.3)

## 2013-01-09 LAB — LIPID PANEL
Cholesterol: 200 mg/dL (ref 0–200)
HDL: 37.1 mg/dL — ABNORMAL LOW (ref >39.00)
LDL Cholesterol: 145 mg/dL — ABNORMAL HIGH (ref 0–99)
Total CHOL/HDL Ratio: 5
Triglycerides: 88 mg/dL (ref 0.0–149.0)
VLDL: 17.6 mg/dL (ref 0.0–40.0)

## 2013-01-15 ENCOUNTER — Ambulatory Visit (INDEPENDENT_AMBULATORY_CARE_PROVIDER_SITE_OTHER): Payer: BC Managed Care – PPO | Admitting: Pharmacist

## 2013-01-15 VITALS — Wt 198.0 lb

## 2013-01-15 DIAGNOSIS — E785 Hyperlipidemia, unspecified: Secondary | ICD-10-CM

## 2013-01-15 DIAGNOSIS — Z79899 Other long term (current) drug therapy: Secondary | ICD-10-CM

## 2013-01-15 MED ORDER — METAMUCIL PO WAFR: 1.0000 | WAFER | Freq: Two times a day (BID) | ORAL | Status: DC

## 2013-01-15 MED ORDER — PRAVASTATIN SODIUM 20 MG PO TABS: 20.0000 mg | ORAL_TABLET | Freq: Every day | ORAL | Status: DC

## 2013-01-15 NOTE — Assessment & Plan Note (Addendum)
Given patient's h/o MI x 2, strong family h/o CAD, and LDL of 145 mg/dL, would like to get patient on a daily statin.  Since she has failed 3 moderate/high dose statins, will start patient on a lower dose statin and titrate upwards.  Will also have her increase her phytosterol supplement in hopes of lowering dietary cholesterol since she can't tolerate Zetia.  She would like to avoid Welchol given her h/o constipation which I think is reasonable at this time.  We discussed pravastatin 10-20 mg daily, Livalo 1 mg qd, and Crestor 10 mg qweek.  We will start with pravastatin and titrate upward, but if she fails this she will let me know, then Livalo will be tried, and if this also fails, Crestor once weekly.  She would like to be kept in mind if a PCSK-9 inhibitor study if she can't tolerate any of these statins and they will enroll her.  We discussed multiple dietary changes, most importantly her lunch.  She is to start making a low fat option at home the night before and taking it with her to work.  She agrees to start using her treadmill with a goal of 30-40 minutes, 5 days of the week.  Will have her increase Co-Q 10 to 200 mg daily.  I suggested she could try Metamucil as well which should help not only with her h/o constipation, but can help with weight loss and cholesterol reduction as well.  She doesn't want to use powder, but will check into the wafers.  Will recheck blood work in 3 months, and see me the next day.  She will call if any problems.     Plan: 1.  Increase True Heart to 2 tablets twice daily - take before lunch and dinner.  Make sure Co-Q 10 is 200 mg daily. 2.  Start pravastatin 20 mg - 1/2 tablet daily for 4 weeks, then increase to 1 tablet daily. 3.  Start using treadmill 30 minutes daily at least, for 5-6 days per week. 4.  Lunch - limit fast food to no more than 1-2 times per week.  Would prefer to avoid this all together.  No more french fries.  Start making lunch the night before and  taking it to work. 5.  Metamucil wafers/tablets - take the dose on the box twice daily between meals. 6.  Recheck cholesterol in 3 months (3/17 fasting), and see Riki Rusk the next day (3/18 at 8:30)

## 2013-01-15 NOTE — Patient Instructions (Addendum)
1.  Increase Tru Heart to 2 tablets twice daily - take before lunch and dinner.  Make sure Co-Q 10 is 200 mg daily. 2.  Start pravastatin 20 mg - 1/2 tablet daily for 4 weeks, then increase to 1 tablet daily. 3.  Start using treadmill 30 minutes daily at least, for 5-6 days per week. 4.  Lunch - limit fast food to no more than 1-2 times per week.  Would prefer to avoid this all together.  No more french fries.  Start making lunch the night before and taking it to work. 5.  Metamucil wafers/tablets - take the dose on the box twice daily between meals. 6.  Recheck cholesterol in 3 months (3/17 fasting), and see Riki Rusk the next day (3/18 at 8:30)

## 2013-01-15 NOTE — Progress Notes (Signed)
Patient referred to lipid clinic by Dr. Excell Seltzer due to h/o multiple MIs and inability to tolerate multiple lipid lowering medications.  Patient failed crestor 5 mg qd, lipitor 40 mg, simvastatin 40 mg qd, and most recently Zetia 10 mg qd.  She most recently started taking True Heart 1 tablet twice daily (a phytosterol and Co-Q supplement).  She asked about Red Yeast Rice and other statins today.  Her mother had between 15-20 MI in her lifetime according to patient.  Her vitamin D was normal 07/2012, so this is not likely the source of muscle aches while on statin.  She was not taking Co-Q 10 when on statins in the past, but is on this now.  Years ago when her kids were in school, patient did Weight Watchers and worked out at [employer] regularly, and she lost significant weight doing this.  She is now working everyday at a medical office, and due to time restraints isn't working out or eating as healthy anymore.    Risk Factors:  MI x 2 (most recent 2013), family h/o CAD - LDL goal < 70, non-HDL goal < 100 Meds:  True Heart (phytosterols + CoQ-10) 1 tablet twice daily. Intolerant:  Lipitor 40 mg qd, Simvastatin 40 mg qd, Crestor 5 mg qd (myalgias with these statins), Zetia (depression and constipation).  Family history:  Mother had CAD in her 21's, and had 15-20 MI per patient.  She was also a heavy smoker. Social history:  Patient is a previous smoker - not smoking currently.  Drinks wine occasionally only.  Diet:  Patient typically has oatmeal or cereal bar for breakfast.  Lunch - eats fast food 5 days of the week while at work.  [employer], [employer], [employer] educational needs teacher.  Dinner - She cooks at home and typically has a lean meat, bean or vegetable, and a carb.  She drinks mostly water.  May have 1 diet soda a few days of the week.   Exercise:  None currently.  She states she had a treadmill at home, but not using it currently.  Labs:   01/2013  LDL 145, TC 200, HDL 37, TG 88, LFTs normal (not on lipid lowering  meds). 07/2012 - Vitamin D 25-OH normal.  Current Outpatient Prescriptions  Medication Sig Dispense Refill  . aspirin 81 MG tablet Take 81 mg by mouth daily.      . citalopram (CELEXA) 40 MG tablet TAKE ONE TABLET BY MOUTH ONCE DAILY  30 tablet  3  . fexofenadine (ALLEGRA) 180 MG tablet Take 180 mg by mouth daily.      Marland Kitchen ibuprofen (ADVIL,MOTRIN) 200 MG tablet Take 800 mg by mouth every 6 (six) hours as needed. For pain      . nitroGLYCERIN (NITROSTAT) 0.4 MG SL tablet Place 1 tablet (0.4 mg total) under the tongue every 5 (five) minutes as needed.  25 tablet  3  . pravastatin (PRAVACHOL) 20 MG tablet Take 1 tablet (20 mg total) by mouth daily.  30 tablet  5   No current facility-administered medications for this visit.   Allergies  Allergen Reactions  . Shellfish Allergy   . Statins     Failed Crestor 5 mg qd, Simvastatin 40 mg, and Lipitor 40 mg due to muscle and joint pain  . Zetia [Ezetimibe]     Depression and constipation per patient

## 2013-03-04 ENCOUNTER — Encounter: Payer: Self-pay | Admitting: Podiatry

## 2013-03-04 ENCOUNTER — Ambulatory Visit (INDEPENDENT_AMBULATORY_CARE_PROVIDER_SITE_OTHER): Payer: BC Managed Care – PPO | Admitting: Podiatry

## 2013-03-04 ENCOUNTER — Ambulatory Visit (INDEPENDENT_AMBULATORY_CARE_PROVIDER_SITE_OTHER): Payer: BC Managed Care – PPO

## 2013-03-04 VITALS — BP 129/79 | HR 103 | Resp 16

## 2013-03-04 DIAGNOSIS — M21611 Bunion of right foot: Secondary | ICD-10-CM

## 2013-03-04 DIAGNOSIS — M21619 Bunion of unspecified foot: Secondary | ICD-10-CM

## 2013-03-04 NOTE — Progress Notes (Signed)
Want to talk to him about possible surgery on the right foot bunion, it starting to really hurt . Also i did not wear the orthotics it hurt the right one. She denies any arch pain at this point.  Objective: Vital signs are stable she is alert and oriented x3. Pulses are strongly palpable bilateral. 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 With increase in the first metatarsal angle and lateral abductus angle. Very prominent hypertrophic medial condyle of the first metatarsal. Review of radiographs confirm this. She has pain on range of motion of the first metatarsophalangeal joint with mild crepitation.  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  Plan: Discussed etiology pathology conservative versus surgical therapies. At this point we went over consent form today line bylined number by number giving her ample time to ask questions she saw fit regarding an Austin bunion repair with screw fixation right foot. I answered this to the best of my ability in layman's terms she understood it was amenable to it and signed all 3 pages of the consent form. She was dispensed a cam boot as well as instructions for surgery I will followup with her in the near future for surgical intervention. We are asking for a cardiac clearance.

## 2013-03-05 ENCOUNTER — Encounter: Payer: Self-pay | Admitting: *Deleted

## 2013-03-05 ENCOUNTER — Encounter: Payer: Self-pay | Admitting: Cardiovascular Disease

## 2013-03-09 HISTORY — PX: OTHER SURGICAL HISTORY: SHX169

## 2013-03-12 ENCOUNTER — Other Ambulatory Visit: Payer: Self-pay | Admitting: *Deleted

## 2013-03-12 MED ORDER — CITALOPRAM HYDROBROMIDE 40 MG PO TABS: ORAL_TABLET | ORAL | Status: DC

## 2013-03-28 ENCOUNTER — Encounter: Payer: Self-pay | Admitting: Cardiovascular Disease

## 2013-03-28 ENCOUNTER — Ambulatory Visit (INDEPENDENT_AMBULATORY_CARE_PROVIDER_SITE_OTHER): Payer: BC Managed Care – PPO | Admitting: Cardiovascular Disease

## 2013-03-28 VITALS — BP 110/70 | HR 80 | Ht 62.5 in | Wt 202.0 lb

## 2013-03-28 DIAGNOSIS — E785 Hyperlipidemia, unspecified: Secondary | ICD-10-CM

## 2013-03-28 DIAGNOSIS — I214 Non-ST elevation (NSTEMI) myocardial infarction: Secondary | ICD-10-CM

## 2013-03-28 DIAGNOSIS — I2510 Atherosclerotic heart disease of native coronary artery without angina pectoris: Secondary | ICD-10-CM

## 2013-03-28 NOTE — Patient Instructions (Signed)
You have been given cardiac clearance for surgery by Dr. Burt Knack.  Your physician wants you to follow-up in: 1 year with Dr. Burt Knack.  You will receive a reminder letter in the mail two months in advance. If you don't receive a letter, please call our office to schedule the follow-up appointment.

## 2013-03-28 NOTE — Progress Notes (Signed)
HPI:   53 year old woman presenting for followup evaluation. The patient is followed for coronary artery disease. She presented with a non-ST elevation infarction in 2013 and underwent stenting of the left circumflex at that time. She had a repeat heart catheterization earlier this year demonstrating patency of her stent site.   She's been seen at the lipid clinic and started back on low-dose pravastatin after been intolerant to multiple moderate to high dose statin drugs. Her most recent lipid panel prior to initiating pravastatin demonstrated a cholesterol of 200, triglycerides 88, HDL 37, and LDL 145. Unfortunately she was able to tolerate pravastatin for about 4 weeks but then had to stop because of severe myalgias. She's been taking red yeast rice since that time. She has followup scheduled in the lipid clinic in March.  The patient plans on having a right bunionectomy. She is here for cardiac clearance. She's been having frequent chest pain but relates this to high stress levels. She can tell a difference between stress related pain and her past angina. She's been walking regularly and has been following a 10,000 step program. She is up to 13,000 steps a day and has no symptoms with exertion.   Outpatient Encounter Prescriptions as of 03/28/2013  Medication Sig  . aspirin 81 MG tablet Take 81 mg by mouth daily.  . citalopram (CELEXA) 40 MG tablet TAKE ONE TABLET BY MOUTH ONCE DAILY  . fexofenadine (ALLEGRA) 180 MG tablet Take 180 mg by mouth daily.  Marland Kitchen ibuprofen (ADVIL,MOTRIN) 200 MG tablet Take 800 mg by mouth every 6 (six) hours as needed. For pain  . nitroGLYCERIN (NITROSTAT) 0.4 MG SL tablet Place 1 tablet (0.4 mg total) under the tongue every 5 (five) minutes as needed.  . Nutritional Supplements (ADULT NUTRITIONAL SUPPLEMENT PO) Take 2 capsules by mouth 2 (two) times daily. Tru Heart vitamin  - 2 capsules bid contains daily: Phytosterols - 2000 mg, Vitamin D 1200 IU, Co-Q-10 100 mg  .  pravastatin (PRAVACHOL) 20 MG tablet Take 1 tablet (20 mg total) by mouth daily.  . Psyllium (METAMUCIL) WAFR Take 1-2 Wafers by mouth 2 (two) times daily.    Allergies  Allergen Reactions  . Shellfish Allergy   . Statins     Failed Crestor 5 mg qd, Simvastatin 40 mg, and Lipitor 40 mg due to muscle and joint pain  . Zetia [Ezetimibe]     Depression and constipation per patient    Past Medical History  Diagnosis Date  . Myocardial infarction     ~56yrs ago  . Degenerative disc disease   . Cervical cancer   . NSTEMI (non-ST elevated myocardial infarction) 06/24/2011    likely spontaneous dissection/ruptured plaque in OM3 - PCI: 3 overlapping Promus DES to OM3, EF 55%  . CAD (coronary artery disease)    ROS: Negative except as per HPI  BP 110/70  Pulse 80  Ht 5' 2.5" (1.588 m)  Wt 202 lb (91.627 kg)  BMI 36.33 kg/m2  PHYSICAL EXAM: Pt is alert and oriented, NAD HEENT: normal Neck: JVP - normal, carotids 2+= without bruits Lungs: CTA bilaterally CV: RRR without murmur or gallop Abd: soft, NT, Positive BS, no hepatomegaly Ext: no C/C/E, distal pulses intact and equal Skin: warm/dry no rash  EKG:  Normal sinus rhythm 80 beats per minute, low-voltage QRS, within normal limits.  ASSESSMENT AND PLAN: 1. Coronary artery disease, native vessel. The patient has had episodic chest pain related to stress ever since her initial PCI procedure. She  did have a relook cardiac catheterization that demonstrated patency of her stent site. She's having no exertional angina and I think she can proceed with her bunionectomy at low risk of cardiac complications. No further cardiac testing is indicated at this time. She will followup in one year.  2. Hyperlipidemia. Unfortunately she was intolerant to low-dose pravastatin. She has failed multiple other statin drugs because of myalgias. She has followup scheduled in the lipid clinic next month. She currently is taking red yeast rice.  For  followup I will see her back in one year.  Sherren Mocha 03/28/2013 12:09 PM

## 2013-04-03 ENCOUNTER — Ambulatory Visit: Payer: BC Managed Care – PPO | Admitting: Family Medicine

## 2013-04-04 ENCOUNTER — Encounter: Payer: Self-pay | Admitting: Podiatry

## 2013-04-04 DIAGNOSIS — M2010 Hallux valgus (acquired), unspecified foot: Secondary | ICD-10-CM

## 2013-04-10 ENCOUNTER — Ambulatory Visit (INDEPENDENT_AMBULATORY_CARE_PROVIDER_SITE_OTHER): Payer: BC Managed Care – PPO | Admitting: Podiatry

## 2013-04-10 ENCOUNTER — Ambulatory Visit (INDEPENDENT_AMBULATORY_CARE_PROVIDER_SITE_OTHER): Payer: BC Managed Care – PPO

## 2013-04-10 ENCOUNTER — Encounter: Payer: Self-pay | Admitting: Podiatry

## 2013-04-10 VITALS — BP 126/74 | HR 102 | Temp 97.7°F | Resp 12

## 2013-04-10 DIAGNOSIS — Z9889 Other specified postprocedural states: Secondary | ICD-10-CM

## 2013-04-10 NOTE — Progress Notes (Signed)
She presents today one week status post [employer] bunion right. She denies fever chills nausea vomiting muscle aches and pains.  Objective: Vital signs are stable she is alert and oriented x3. Presents today in a Cam Walker dry sterile compressive dressing is intact once removed demonstrates no erythema minimal edema no cellulitis drainage or odor. Margins are well coapted and she has a good range of motion of the first metatarsophalangeal joint radiographic evaluation demonstrates well-healing surgical foot with screw to the first metatarsal head.  Assessment: Well-healing surgical foot status post 1 week Austin bunion repair.  Plan: Redressed today with a dressed a compressive dressing followup with her in one week.

## 2013-04-11 ENCOUNTER — Other Ambulatory Visit: Payer: Self-pay | Admitting: Family Medicine

## 2013-04-11 NOTE — Telephone Encounter (Signed)
Med filled #[phone]. Letter mailed for appt.

## 2013-04-14 NOTE — Progress Notes (Signed)
Surgery performed by Dr Milinda Pointer - Right Liane Comber bunionectomy with screw.

## 2013-04-17 ENCOUNTER — Ambulatory Visit (INDEPENDENT_AMBULATORY_CARE_PROVIDER_SITE_OTHER): Payer: BC Managed Care – PPO | Admitting: Podiatry

## 2013-04-17 ENCOUNTER — Encounter: Payer: Self-pay | Admitting: Podiatry

## 2013-04-17 VITALS — BP 103/75 | HR 98 | Temp 98.3°F | Resp 16

## 2013-04-17 DIAGNOSIS — Z9889 Other specified postprocedural states: Secondary | ICD-10-CM

## 2013-04-18 NOTE — Progress Notes (Signed)
She presents today for followup of an Austin bunion repair status post 2 weeks. She states it is swollen and numb feeling. She denies fever chills nausea vomiting muscle aches and pains.  Objective: Vital signs are stable she is alert and oriented x3. She presents today with her Cam Walker and dressing intact. Once removed demonstrates moderate edema no erythema saline is drainage or odor margins are well coapted sutures are intact. She has a good passive range of motion of the first metatarsophalangeal joint and encouraged active range of motion.  Assessment: Well-healing surgical foot right status post [place] bunion repair 227 appears to be healing quite nicely.  Plan: Encouraged range of motion exercises put her in a compression anklet and a Darco shoe followup with her in 2 weeks for another set of x-rays.

## 2013-04-22 ENCOUNTER — Other Ambulatory Visit (INDEPENDENT_AMBULATORY_CARE_PROVIDER_SITE_OTHER): Payer: BC Managed Care – PPO

## 2013-04-22 DIAGNOSIS — Z79899 Other long term (current) drug therapy: Secondary | ICD-10-CM

## 2013-04-22 DIAGNOSIS — E785 Hyperlipidemia, unspecified: Secondary | ICD-10-CM

## 2013-04-22 LAB — LIPID PANEL
Cholesterol: 198 mg/dL (ref 0–200)
HDL: 34.8 mg/dL — ABNORMAL LOW (ref >39.00)
LDL Cholesterol: 142 mg/dL — ABNORMAL HIGH (ref 0–99)
Total CHOL/HDL Ratio: 6
Triglycerides: 105 mg/dL (ref 0.0–149.0)
VLDL: 21 mg/dL (ref 0.0–40.0)

## 2013-04-22 LAB — HEPATIC FUNCTION PANEL
ALT: 23 U/L (ref 0–35)
AST: 21 U/L (ref 0–37)
Albumin: 4.6 g/dL (ref 3.5–5.2)
Alkaline Phosphatase: 57 U/L (ref 39–117)
Bilirubin, Direct: 0 mg/dL (ref 0.0–0.3)
Total Bilirubin: 1.1 mg/dL (ref 0.3–1.2)
Total Protein: 7.1 g/dL (ref 6.0–8.3)

## 2013-04-23 ENCOUNTER — Ambulatory Visit (INDEPENDENT_AMBULATORY_CARE_PROVIDER_SITE_OTHER): Payer: BC Managed Care – PPO | Admitting: Pharmacist

## 2013-04-23 ENCOUNTER — Ambulatory Visit (INDEPENDENT_AMBULATORY_CARE_PROVIDER_SITE_OTHER): Payer: BC Managed Care – PPO | Admitting: Family Medicine

## 2013-04-23 ENCOUNTER — Encounter: Payer: Self-pay | Admitting: Family Medicine

## 2013-04-23 VITALS — Wt 197.0 lb

## 2013-04-23 VITALS — BP 102/72 | HR 89 | Temp 98.2°F | Resp 16 | Wt 199.4 lb

## 2013-04-23 DIAGNOSIS — E785 Hyperlipidemia, unspecified: Secondary | ICD-10-CM

## 2013-04-23 DIAGNOSIS — F341 Dysthymic disorder: Secondary | ICD-10-CM

## 2013-04-23 DIAGNOSIS — F418 Other specified anxiety disorders: Secondary | ICD-10-CM

## 2013-04-23 MED ORDER — ALPRAZOLAM 0.5 MG PO TABS: 0.5000 mg | ORAL_TABLET | Freq: Two times a day (BID) | ORAL | Status: DC | PRN

## 2013-04-23 NOTE — Progress Notes (Signed)
   Subjective:    Patient ID: Maria Bishop, female    DOB: [birth year], 53 y.o.   MRN: [id number]  HPI Anxiety- pt has seen counselor previously and was doing fairly well on Celexa and counseling.  Pt reports anxiety is adequately controlled at baseline but then will 'cluster together' and occur all at once making it difficult to function at work.  Pt has been out of work for 3 weeks and hasn't had a panicked moment.  It will take pt '2-3 days to recuperate from it' b/c after a panicked moment, the 'depression hits real hard'.   Review of Systems For ROS see HPI     Objective:   Physical Exam  Vitals reviewed. Constitutional: She is oriented to person, place, and time. She appears well-developed and well-nourished.  tearful  Neurological: She is alert and oriented to person, place, and time.  Skin: Skin is warm and dry.  Psychiatric: Her behavior is normal. Thought content normal.  Tearful, anxious          Assessment & Plan:

## 2013-04-23 NOTE — Progress Notes (Signed)
Pre visit review using our clinic review tool, if applicable. No additional management support is needed unless otherwise documented below in the visit note. 

## 2013-04-23 NOTE — Progress Notes (Signed)
Patient referred to lipid clinic by Dr. Burt Knack due to h/o multiple MIs and inability to tolerate multiple lipid lowering medications.  Patient failed crestor 5 mg qd, lipitor 40 mg, simvastatin 40 mg qd,  Zetia 10 mg qd, and most recently pravastatin 10 mg qd.  She is taking metamucil pills 1-2 times daily and taking True Heart 1 tablet twice daily (a phytosterol and Co-Q supplement).  She tried taking red yeast rice last month, but ended up stopping it three weeks ago.  Her mother had between 15-20 MI in her lifetime according to patient.  Her vitamin D was normal 07/2012, so this is not likely the source of muscle aches while on statin.  Years ago when her kids were in school, patient did Weight Watchers and worked out at [employer] regularly, and she lost significant weight doing this.  She is now working everyday at a medical office, and due to time restraints isn't working out as much, but is using treadmill periodically and walking as much as possible.  She using a App on her phone that is helping track her foods and exercise patterns.  She is now getting 13,000 steps per day (> 5 miles).  Risk Factors:  MI x 2 (most recent 2013), family h/o CAD - LDL goal < 70, non-HDL goal < 100 Meds:  True Heart (phytosterols + CoQ-10) 1 tablet twice daily. Intolerant:  Lipitor 40 mg qd, Simvastatin 40 mg qd, Crestor 5 mg qd (myalgias with these 3 statins), Pravastatin 10 mg qd (muscle aches), Zetia (depression and constipation).  Family history:  Mother had CAD in her 55's, and had 15-20 MI per patient.  She was also a heavy smoker. Social history:  Patient is a previous smoker - not smoking currently.  Drinks wine occasionally only.  Diet:  Patient typically has oatmeal or cereal bar for breakfast.  Lunch - no longer eating fast food, and is now eating food she brings from home.  Dinner - She cooks at home and typically has a lean meat, bean or vegetable, and a carb.  She drinks mostly water.  May have 1 diet soda a  few days of the week.   Exercise: Walking > 13,000 steps daily (> 5 miles)  Labs:   04/2013:  LDL 142, TC 198, TG 105, HDL 35 (phytosterols and metamucil only) 01/2013  LDL 145, TC 200, HDL 37, TG 88, LFTs normal (not on lipid lowering meds). 07/2012 - Vitamin D 25-OH normal.  Current Outpatient Prescriptions  Medication Sig Dispense Refill  . ALPRAZolam (XANAX) 0.5 MG tablet Take 1 tablet (0.5 mg total) by mouth 2 (two) times daily as needed for anxiety.  60 tablet  1  . aspirin 81 MG tablet Take 81 mg by mouth daily.      . citalopram (CELEXA) 40 MG tablet TAKE ONE TABLET BY MOUTH ONCE DAILY-NEEDS TO BE SEEN  30 tablet  0  . fexofenadine (ALLEGRA) 180 MG tablet Take 180 mg by mouth daily.      Marland Kitchen ibuprofen (ADVIL,MOTRIN) 200 MG tablet Take 800 mg by mouth every 6 (six) hours as needed. For pain      . nitroGLYCERIN (NITROSTAT) 0.4 MG SL tablet Place 1 tablet (0.4 mg total) under the tongue every 5 (five) minutes as needed.  25 tablet  3  . Nutritional Supplements (ADULT NUTRITIONAL SUPPLEMENT PO) Take 2 capsules by mouth 2 (two) times daily. Tru Heart vitamin  - 2 capsules bid contains daily: Phytosterols - 2000  mg, Vitamin D 1200 IU, Co-Q-10 100 mg      . Psyllium (METAMUCIL) WAFR Take 1-2 Wafers by mouth 2 (two) times daily.       No current facility-administered medications for this visit.   Allergies  Allergen Reactions  . Shellfish Allergy   . Statins     Failed Crestor 5 mg qd, Simvastatin 40 mg, and Lipitor 40 mg, and pravastatin 10 mg qd due to muscle and joint pain  . Zetia [Ezetimibe]     Depression and constipation per patient

## 2013-04-23 NOTE — Assessment & Plan Note (Signed)
Patient and I discussed possible treatment options today which included such things as weekly Crestor or enrolling into PCSK-9 inhibitor trial.  Patient doesn't want another statin, even at once weekly.  Given her h/o constipation on Zetia, will not try a bile acid sequestrant either.  Patient is interested in study med, and understands it is a SQ agents given q 2 weeks, and is a placebo controlled trial.  Her last MI was in 2013, so would qualify.  She had cervical cancer 27 years ago and was treated, and hasn't had any issues since - should qualify for study.  I will pass her information to the [facility], and they will be in contact with her in the next few weeks.  She will call me if any questions regarding this medication.

## 2013-04-23 NOTE — Patient Instructions (Signed)
Schedule your complete physical for June Follow up as needed in the xanax is not helping Continue the celexa daily Add the xanax as needed for those panicked moments Call with any questions or concerns Hang in there!!!

## 2013-04-23 NOTE — Patient Instructions (Signed)
Continue current therapy.  Will try to get you enrolled into PCSK-9 inhibitor trial.  You should receive a call in the next few weeks.

## 2013-04-23 NOTE — Assessment & Plan Note (Signed)
Depression has improved but pt still having anxiety attacks.  Will start xanax prn for panicked moments.  Continue celexa.  Will follow closely.

## 2013-04-25 ENCOUNTER — Ambulatory Visit (INDEPENDENT_AMBULATORY_CARE_PROVIDER_SITE_OTHER): Payer: BC Managed Care – PPO

## 2013-04-25 ENCOUNTER — Encounter: Payer: Self-pay | Admitting: Podiatrist

## 2013-04-25 ENCOUNTER — Ambulatory Visit (INDEPENDENT_AMBULATORY_CARE_PROVIDER_SITE_OTHER): Payer: BC Managed Care – PPO | Admitting: Podiatrist

## 2013-04-25 VITALS — BP 106/67 | HR 120 | Resp 16

## 2013-04-25 DIAGNOSIS — M21619 Bunion of unspecified foot: Secondary | ICD-10-CM

## 2013-04-25 DIAGNOSIS — Z9889 Other specified postprocedural states: Secondary | ICD-10-CM

## 2013-04-25 DIAGNOSIS — M21611 Bunion of right foot: Secondary | ICD-10-CM

## 2013-04-25 MED ORDER — OXYCODONE-ACETAMINOPHEN 5-325 MG PO TABS: 1.0000 | ORAL_TABLET | Freq: Four times a day (QID) | ORAL | Status: DC | PRN

## 2013-04-25 NOTE — Progress Notes (Signed)
Subjective: Patient presents today for her 3 week postoperative check status post [facility] bunion repair of the right foot. She states that the foot continues to get better and better and overall she's pleased with her result. She denies any local or systemic signs of infection. And she's been using her Darco shoe as instructed.  Objective: Neurovascular status is unchanged. Excellent appearance of incision line is noted at today's visit. Moderate edema continues to be present at the first metatarsophalangeal joint which is slowly improving according to the patient. Range of motion is adequate at the first metatarsophalangeal joint. X-rays are taken reveal satisfactory postoperative progress of the [facility] bunion correction site.  Assessment: Status post [place] bunion correction 3 weeks postoperative  Plan: The patient is instructed that she may slowly wean out of her Darco shoe into a good supportive athletic shoe after one more week of being in the Darco shoe.. She is also to continue elevating her foot and wearing her compression stocking during the day. She continue with active range of motion exercises and passive exercises at the first metatarsophalangeal joint as well. She will be seen back for her follow up with Dr. Milinda Pointer in 3 weeks and if she has any problems or concerns prior that visit she will call.

## 2013-04-25 NOTE — Patient Instructions (Signed)
Continue range of motion exercises at the great toe joint.  Keep elevating your foot as much as possible and wearing your compressive sock

## 2013-05-15 ENCOUNTER — Encounter: Payer: Self-pay | Admitting: Podiatry

## 2013-05-15 ENCOUNTER — Ambulatory Visit (INDEPENDENT_AMBULATORY_CARE_PROVIDER_SITE_OTHER): Payer: BC Managed Care – PPO | Admitting: Podiatry

## 2013-05-15 ENCOUNTER — Ambulatory Visit (INDEPENDENT_AMBULATORY_CARE_PROVIDER_SITE_OTHER): Payer: BC Managed Care – PPO

## 2013-05-15 VITALS — BP 106/67 | HR 85 | Resp 16

## 2013-05-15 DIAGNOSIS — Z9889 Other specified postprocedural states: Secondary | ICD-10-CM

## 2013-05-15 NOTE — Progress Notes (Signed)
She presents today for followup of her surgical foot. She has a great range of motion of the first metatarsophalangeal joint has no problem she says. As she refers to her right [facility].  Objective: Vital signs are stable she is alert and oriented x3. Pulses are palpable. She has a great range of motion the first metatarsophalangeal joint radiographic evaluation demonstrates well-healing osteotomy.  Assessment: Well-healing surgical foot right.  Plan: Followup with me on an as-needed basis

## 2013-05-19 ENCOUNTER — Other Ambulatory Visit: Payer: Self-pay | Admitting: Family Medicine

## 2013-05-19 NOTE — Telephone Encounter (Signed)
Med filled.  

## 2013-06-19 ENCOUNTER — Telehealth: Payer: Self-pay | Admitting: Family Medicine

## 2013-06-19 NOTE — Telephone Encounter (Signed)
Pt needs appt. Has not been seen since March.

## 2013-06-19 NOTE — Telephone Encounter (Signed)
Caller name: Sherl Relation to pt: Call back number:[phone] Pharmacy: [facility], [facility]  Reason for call:   Pt states that she has a yeast infection.  Pt wants to know if something can be called in.  Will make apt for first available if necessary.

## 2013-06-19 NOTE — Telephone Encounter (Signed)
Left vm for call back to schedule apt

## 2013-07-08 ENCOUNTER — Encounter: Payer: Self-pay | Admitting: Family Medicine

## 2013-07-08 ENCOUNTER — Encounter (HOSPITAL_COMMUNITY): Payer: Self-pay | Admitting: Cardiovascular Disease

## 2013-07-08 ENCOUNTER — Ambulatory Visit (INDEPENDENT_AMBULATORY_CARE_PROVIDER_SITE_OTHER): Payer: BC Managed Care – PPO | Admitting: Family Medicine

## 2013-07-08 VITALS — BP 120/80 | HR 98 | Temp 98.2°F | Resp 16 | Wt 204.0 lb

## 2013-07-08 DIAGNOSIS — R1013 Epigastric pain: Secondary | ICD-10-CM | POA: Insufficient documentation

## 2013-07-08 DIAGNOSIS — A4902 Methicillin resistant Staphylococcus aureus infection, unspecified site: Secondary | ICD-10-CM

## 2013-07-08 DIAGNOSIS — K219 Gastro-esophageal reflux disease without esophagitis: Secondary | ICD-10-CM | POA: Insufficient documentation

## 2013-07-08 LAB — CBC WITH DIFFERENTIAL/PLATELET
Basophils Absolute: 0 10*3/uL (ref 0.0–0.1)
Basophils Relative: 0.4 % (ref 0.0–3.0)
Eosinophils Absolute: 0.1 10*3/uL (ref 0.0–0.7)
Eosinophils Relative: 1.4 % (ref 0.0–5.0)
HCT: 41.6 % (ref 36.0–46.0)
Hemoglobin: 14.3 g/dL (ref 12.0–15.0)
Lymphocytes Relative: 32.7 % (ref 12.0–46.0)
Lymphs Abs: 1.2 10*3/uL (ref 0.7–4.0)
MCHC: 34.3 g/dL (ref 30.0–36.0)
MCV: 91.3 fl (ref 78.0–100.0)
Monocytes Absolute: 0.3 10*3/uL (ref 0.1–1.0)
Monocytes Relative: 8 % (ref 3.0–12.0)
Neutro Abs: 2.2 10*3/uL (ref 1.4–7.7)
Neutrophils Relative %: 57.5 % (ref 43.0–77.0)
Platelets: 212 10*3/uL (ref 150.0–400.0)
RBC: 4.56 Mil/uL (ref 3.87–5.11)
RDW: 14 % (ref 11.5–15.5)
WBC: 3.8 10*3/uL — ABNORMAL LOW (ref 4.0–10.5)

## 2013-07-08 LAB — BASIC METABOLIC PANEL
BUN: 16 mg/dL (ref 6–23)
CO2: 27 mEq/L (ref 19–32)
Calcium: 9.5 mg/dL (ref 8.4–10.5)
Chloride: 103 mEq/L (ref 96–112)
Creatinine, Ser: 0.8 mg/dL (ref 0.4–1.2)
GFR: 86.73 mL/min (ref >60.00)
Glucose, Bld: 82 mg/dL (ref 70–99)
Potassium: 4.1 mEq/L (ref 3.5–5.1)
Sodium: 138 mEq/L (ref 135–145)

## 2013-07-08 LAB — HEPATIC FUNCTION PANEL
ALT: 17 U/L (ref 0–35)
AST: 19 U/L (ref 0–37)
Albumin: 4.5 g/dL (ref 3.5–5.2)
Alkaline Phosphatase: 60 U/L (ref 39–117)
Bilirubin, Direct: 0.1 mg/dL (ref 0.0–0.3)
Total Bilirubin: 1 mg/dL (ref 0.2–1.2)
Total Protein: 7.2 g/dL (ref 6.0–8.3)

## 2013-07-08 LAB — AMYLASE: Amylase: 48 U/L (ref 27–131)

## 2013-07-08 LAB — LIPASE: Lipase: 22 U/L (ref 11.0–59.0)

## 2013-07-08 LAB — H. PYLORI ANTIBODY, IGG: H Pylori IgG: NEGATIVE

## 2013-07-08 MED ORDER — OMEPRAZOLE 20 MG PO CPDR: 20.0000 mg | DELAYED_RELEASE_CAPSULE | Freq: Every day | ORAL | Status: DC

## 2013-07-08 NOTE — Progress Notes (Signed)
   Subjective:    Patient ID: Maria Bishop, female    DOB: October 14, 1960, 53 y.o.   MRN: [id number]  HPI MRSA- pt reports she has had 4 labial cysts removed in the past week.  dx'd w/ MRSA at time of excision.  Prior to cysts appearing, had 'severe itchy painful rash' in groin.  Pt reports calling 3 times to try and get appt but was told it would be 2-3 weeks until she could get an appt and go to UC.  Pt treated rash w/ Gold Bond and Zinc and said it 'sorta dried up' but then developed a cyst 2 weeks ago.  Saw derm and was given 2 separate ointments after having 1 lanced.  Started on Doxycycline yesterday after cultures came back positive for MRSA.  Has questions regarding MRSA dx- is she contagious?  What about sex?  Where and when did i get this?  GERD- pt reports intermittent burning sensation in back of throat, nausea x1 month.  Increased bloating and tightness after eating.  No CP, SOB.  Denies vomiting or bowel changes.   Review of Systems For ROS see HPI     Objective:   Physical Exam  Vitals reviewed. Constitutional: She is oriented to person, place, and time. She appears well-developed and well-nourished. No distress.  Cardiovascular: Normal rate, regular rhythm, normal heart sounds and intact distal pulses.   Pulmonary/Chest: Effort normal and breath sounds normal. No respiratory distress. She has no wheezes. She has no rales.  Abdominal: Soft. Bowel sounds are normal. She exhibits distension. She exhibits no mass. Tenderness: mild epigastric TTP. There is no rebound and no guarding.  Musculoskeletal: She exhibits no edema.  Neurological: She is alert and oriented to person, place, and time.  Skin: Skin is warm and dry.  Psychiatric: She has a normal mood and affect. Her behavior is normal.          Assessment & Plan:

## 2013-07-08 NOTE — Progress Notes (Signed)
Pre visit review using our clinic review tool, if applicable. No additional management support is needed unless otherwise documented below in the visit note. 

## 2013-07-08 NOTE — Assessment & Plan Note (Signed)
New.  Pt was dx'd at dermatology w/ multiple MRSA 'cysts' or 'boils' of labia.  These were excised and pt is now on Doxy and was given Bactroban to treat her nose.  Answered her questions to the best of my ability- not contagious at work, would hold on sex until lesions have healed and she has completed course of Doxy, and no way to know where or when she was infected.  Pt appreciative of information.

## 2013-07-08 NOTE — Assessment & Plan Note (Signed)
New.  Start PPI.  If no improvement in sxs will need GI referral.  Pt expressed understanding and is in agreement w/ plan.

## 2013-07-08 NOTE — Patient Instructions (Signed)
Follow up as needed We'll notify you of your lab results and make any changes if needed Start the Omeprazole daily for acid reflux Finish the Doxycycline as directed- take w/ food Call with any questions or concerns Hang in there!!!

## 2013-07-08 NOTE — Assessment & Plan Note (Signed)
New.  Suspect this is due to acid overproduction and untreated GERD.  Start PPI.  Check labs to r/o pancreatitis, biliary dysfxn, infxn, H pylori.  Reviewed dietary and lifestyle modifications.  If no improvement, will refer to GI.  Pt expressed understanding and is in agreement w/ plan.

## 2013-07-14 LAB — CULTURE, BLOOD (SINGLE)
Organism ID, Bacteria: NO GROWTH
Organism ID, Bacteria: NO GROWTH

## 2013-07-25 ENCOUNTER — Telehealth: Payer: Self-pay

## 2013-07-25 NOTE — Telephone Encounter (Signed)
Called and left voice message for call back 

## 2013-07-25 NOTE — Telephone Encounter (Signed)
Pt returned call.  Call back

## 2013-07-25 NOTE — Telephone Encounter (Signed)
Left message for call back Non-identifiable   Pap- 07/19/12- negative CCS- DUE MMG- 08/08/12-negative---DUE Td- declined

## 2013-07-25 NOTE — Telephone Encounter (Signed)
Medication and allergies:  Reviewed and updated  90 day supply/mail order: n/a Local pharmacy:  [facility] [place], [place] - [address]   Immunizations due: Td/tdap   A/P: No changes to personal, family history or past surgical hx Pap- 07/19/12- negative  CCS- DUE  MMG- 08/08/12-negative---DUE  Td- declined  To Discuss with Provider: Pt would like a lipid panel drawn.

## 2013-07-28 ENCOUNTER — Ambulatory Visit (INDEPENDENT_AMBULATORY_CARE_PROVIDER_SITE_OTHER): Payer: BC Managed Care – PPO | Admitting: Family Medicine

## 2013-07-28 ENCOUNTER — Encounter: Payer: Self-pay | Admitting: Family Medicine

## 2013-07-28 VITALS — BP 104/78 | HR 87 | Temp 98.4°F | Resp 16 | Ht 64.0 in | Wt 204.0 lb

## 2013-07-28 DIAGNOSIS — R1314 Dysphagia, pharyngoesophageal phase: Secondary | ICD-10-CM

## 2013-07-28 DIAGNOSIS — R1310 Dysphagia, unspecified: Secondary | ICD-10-CM | POA: Insufficient documentation

## 2013-07-28 DIAGNOSIS — Z01419 Encounter for gynecological examination (general) (routine) without abnormal findings: Secondary | ICD-10-CM

## 2013-07-28 DIAGNOSIS — R1319 Other dysphagia: Secondary | ICD-10-CM

## 2013-07-28 LAB — LIPID PANEL
Cholesterol: 204 mg/dL — ABNORMAL HIGH (ref 0–200)
HDL: 37.3 mg/dL — ABNORMAL LOW (ref >39.00)
LDL Cholesterol: 144 mg/dL — ABNORMAL HIGH (ref 0–99)
NonHDL: 166.7
Total CHOL/HDL Ratio: 5
Triglycerides: 115 mg/dL (ref 0.0–149.0)
VLDL: 23 mg/dL (ref 0.0–40.0)

## 2013-07-28 LAB — BASIC METABOLIC PANEL
BUN: 14 mg/dL (ref 6–23)
CO2: 24 mEq/L (ref 19–32)
Calcium: 9.3 mg/dL (ref 8.4–10.5)
Chloride: 107 mEq/L (ref 96–112)
Creatinine, Ser: 0.7 mg/dL (ref 0.4–1.2)
GFR: 89.46 mL/min (ref >60.00)
Glucose, Bld: 86 mg/dL (ref 70–99)
Potassium: 3.6 mEq/L (ref 3.5–5.1)
Sodium: 140 mEq/L (ref 135–145)

## 2013-07-28 LAB — CBC WITH DIFFERENTIAL/PLATELET
Basophils Absolute: 0 10*3/uL (ref 0.0–0.1)
Basophils Relative: 0.3 % (ref 0.0–3.0)
Eosinophils Absolute: 0.1 10*3/uL (ref 0.0–0.7)
Eosinophils Relative: 2.5 % (ref 0.0–5.0)
HCT: 40.8 % (ref 36.0–46.0)
Hemoglobin: 13.8 g/dL (ref 12.0–15.0)
Lymphocytes Relative: 29.9 % (ref 12.0–46.0)
Lymphs Abs: 1 10*3/uL (ref 0.7–4.0)
MCHC: 33.8 g/dL (ref 30.0–36.0)
MCV: 92.8 fl (ref 78.0–100.0)
Monocytes Absolute: 0.2 10*3/uL (ref 0.1–1.0)
Monocytes Relative: 7.1 % (ref 3.0–12.0)
Neutro Abs: 2.1 10*3/uL (ref 1.4–7.7)
Neutrophils Relative %: 60.2 % (ref 43.0–77.0)
Platelets: 198 10*3/uL (ref 150.0–400.0)
RBC: 4.4 Mil/uL (ref 3.87–5.11)
RDW: 14.4 % (ref 11.5–15.5)
WBC: 3.5 10*3/uL — ABNORMAL LOW (ref 4.0–10.5)

## 2013-07-28 LAB — HEPATIC FUNCTION PANEL
ALT: 15 U/L (ref 0–35)
AST: 18 U/L (ref 0–37)
Albumin: 4.4 g/dL (ref 3.5–5.2)
Alkaline Phosphatase: 51 U/L (ref 39–117)
Bilirubin, Direct: 0 mg/dL (ref 0.0–0.3)
Total Bilirubin: 1.1 mg/dL (ref 0.2–1.2)
Total Protein: 6.8 g/dL (ref 6.0–8.3)

## 2013-07-28 LAB — TSH: TSH: 1.47 u[IU]/mL (ref 0.35–4.50)

## 2013-07-28 LAB — VITAMIN D 25 HYDROXY (VIT D DEFICIENCY, FRACTURES): VITD: 36.85 ng/mL

## 2013-07-28 NOTE — Assessment & Plan Note (Signed)
Pt's PE WNL.  UTD on mammo.  Offered yearly paps due to hx of cervical cancer but pt prefers to wait a year.  Due for colonoscopy but pt states, 'not yet'.  Check labs.  Anticipatory guidance provided.

## 2013-07-28 NOTE — Progress Notes (Signed)
   Subjective:    Patient ID: Maria Bishop, female    DOB: 19-Aug-1960, 53 y.o.   MRN: [id number]  HPI CPE- UTD on pap, mammo.  Due for colonoscopy- 'not yet'.   Review of Systems Patient reports no vision/ hearing changes, adenopathy,fever, weight change,  persistant/recurrent hoarseness, chest pain, palpitations, edema, persistant/recurrent cough, hemoptysis, dyspnea (rest/exertional/paroxysmal nocturnal), gastrointestinal bleeding (melena, rectal bleeding), abdominal pain, bowel changes, GU symptoms (dysuria, hematuria, incontinence), Gyn symptoms (abnormal  bleeding, pain),  syncope, focal weakness, memory loss, numbness & tingling, skin/hair/nail changes, abnormal bruising or bleeding, anxiety, or depression.  + dysphagia w/ food, still having discomfort from epigastrum up into throat after eating (no relief w/ Omeprazole)    Objective:   Physical Exam General Appearance:    Alert, cooperative, no distress, appears stated age  Head:    Normocephalic, without obvious abnormality, atraumatic  Eyes:    PERRL, conjunctiva/corneas clear, EOM's intact, fundi    benign, both eyes  Ears:    Normal TM's and external ear canals, both ears  Nose:   Nares normal, septum midline, mucosa normal, no drainage    or sinus tenderness  Throat:   Lips, mucosa, and tongue normal; teeth and gums normal  Neck:   Supple, symmetrical, trachea midline, no adenopathy;    Thyroid: no enlargement/tenderness/nodules  Back:     Symmetric, no curvature, ROM normal, no CVA tenderness  Lungs:     Clear to auscultation bilaterally, respirations unlabored  Chest Wall:    No tenderness or deformity   Heart:    Regular rate and rhythm, S1 and S2 normal, no murmur, rub   or gallop  Breast Exam:    Deferred to mammo (upcoming)  Abdomen:     Soft, non-tender, bowel sounds active all four quadrants,    no masses, no organomegaly  Genitalia:    Deferred at pt's request (will do next year)  Rectal:    Extremities:    Extremities normal, atraumatic, no cyanosis or edema  Pulses:   2+ and symmetric all extremities  Skin:   Skin color, texture, turgor normal, no rashes or lesions  Lymph nodes:   Cervical, supraclavicular, and axillary nodes normal  Neurologic:   CNII-XII intact, normal strength, sensation and reflexes    throughout          Assessment & Plan:

## 2013-07-28 NOTE — Patient Instructions (Signed)
Follow up in 6 months to recheck cholesterol We'll notify you of your lab results and make any changes if needed Continue the Omeprazole daily We'll call you with your GI appt Try and make healthy food choices and get regular exercise Call and schedule your mammo when you get your reminder Call with any questions or concerns Have a great summer!

## 2013-07-28 NOTE — Progress Notes (Signed)
Pre visit review using our clinic review tool, if applicable. No additional management support is needed unless otherwise documented below in the visit note. 

## 2013-07-28 NOTE — Assessment & Plan Note (Signed)
Pt continues to have difficulty swallowing and some epigastric discomfort despite starting Omeprazole.  Refer to GI for evaluation and tx.

## 2013-08-04 ENCOUNTER — Encounter: Payer: Self-pay | Admitting: Gastroenterology

## 2013-08-05 ENCOUNTER — Other Ambulatory Visit: Payer: Self-pay | Admitting: Family Medicine

## 2013-08-05 NOTE — Telephone Encounter (Signed)
Med filled and pt notified.  

## 2013-08-05 NOTE — Telephone Encounter (Signed)
Last OV 07-28-13 Med filled 3-18 #60 with 1  NO CSC or UDS on file

## 2013-08-05 NOTE — Telephone Encounter (Signed)
[place] for #60 w/ 1 refill but needs CSC and UDS

## 2013-08-11 ENCOUNTER — Other Ambulatory Visit: Payer: Self-pay | Admitting: Family Medicine

## 2013-08-11 NOTE — Telephone Encounter (Signed)
Spoke with the pt and she stated that she call the pharmacy and they informed her that they did not have the prescription.  So that is why she requested the refill again.   Informed the pt that the prescription was refilled on (08-05-13), and a note stating that she was notified.  Pt stated that she did not receive a call stating the prescription was approved.  I informed her that it was approved,but we will need for her to come and pick the prescription up because are going to need her to sign a CSC and give a urine for a UDS.  I explained the reason for the [facility] and UDS,and the pt understood and agreed.  Pt stated that she will come by on Tues (08-12-13) and pick-up the prescription sign the CSC and give a urine for the UDS.//AB/CMA

## 2013-08-11 NOTE — Telephone Encounter (Signed)
LMOM @ (2:23pm) asking the pt to RTC regarding med refill.  Needing to know if the pt received the refill that was done on (08-05-13:#60,1).//AB/CMA

## 2013-08-13 ENCOUNTER — Encounter: Payer: Self-pay | Admitting: Family Medicine

## 2013-08-13 ENCOUNTER — Other Ambulatory Visit: Payer: Self-pay | Admitting: Family Medicine

## 2013-08-13 MED ORDER — PANTOPRAZOLE SODIUM 40 MG PO TBEC: 40.0000 mg | DELAYED_RELEASE_TABLET | Freq: Every day | ORAL | Status: DC

## 2013-08-28 ENCOUNTER — Telehealth: Payer: Self-pay | Admitting: General Practice

## 2013-08-28 NOTE — Telephone Encounter (Signed)
Received a fax from [facility] wanting recent labs faxed. These were sent on 7/23.

## 2013-09-01 ENCOUNTER — Encounter: Payer: Self-pay | Admitting: Family Medicine

## 2013-09-30 ENCOUNTER — Other Ambulatory Visit: Payer: Self-pay | Admitting: General Practice

## 2013-09-30 MED ORDER — CITALOPRAM HYDROBROMIDE 40 MG PO TABS: 40.0000 mg | ORAL_TABLET | Freq: Every day | ORAL | Status: DC

## 2013-10-09 ENCOUNTER — Ambulatory Visit: Payer: BC Managed Care – PPO | Admitting: Gastroenterology

## 2013-11-10 ENCOUNTER — Telehealth: Payer: Self-pay | Admitting: General Practice

## 2013-11-10 NOTE — Telephone Encounter (Signed)
Lat OV 07-28-13 Alprazolam filled 08-05-13 #60 with 1

## 2013-11-11 MED ORDER — ALPRAZOLAM 0.5 MG PO TABS: ORAL_TABLET | ORAL | Status: DC

## 2013-11-11 NOTE — Telephone Encounter (Signed)
Ok for #60, 1 refill 

## 2013-11-11 NOTE — Telephone Encounter (Signed)
Med filled and faxed.  

## 2013-11-21 ENCOUNTER — Other Ambulatory Visit: Payer: Self-pay

## 2013-11-25 ENCOUNTER — Encounter (HOSPITAL_COMMUNITY): Payer: Self-pay | Admitting: Cardiovascular Disease

## 2013-11-26 ENCOUNTER — Other Ambulatory Visit: Payer: Self-pay

## 2013-11-26 MED ORDER — NITROGLYCERIN 0.4 MG SL SUBL: 0.4000 mg | SUBLINGUAL_TABLET | SUBLINGUAL | Status: AC | PRN

## 2014-01-05 ENCOUNTER — Encounter: Payer: Self-pay | Admitting: Family Medicine

## 2014-01-05 MED ORDER — PANTOPRAZOLE SODIUM 40 MG PO TBEC: 40.0000 mg | DELAYED_RELEASE_TABLET | Freq: Every day | ORAL | Status: DC

## 2014-01-05 NOTE — Telephone Encounter (Signed)
I never received a refill request from her pharmacy, however I filled the pantoprazole as requested this am. Please could you look into the pt concern of being on hold and not reaching anyone to schedule an appt?

## 2014-01-05 NOTE — Telephone Encounter (Signed)
Med filled today

## 2014-01-15 ENCOUNTER — Encounter (HOSPITAL_COMMUNITY): Payer: Self-pay | Admitting: Interventional Cardiology

## 2014-01-21 ENCOUNTER — Encounter: Payer: Self-pay | Admitting: Family Medicine

## 2014-01-21 MED ORDER — ALPRAZOLAM 0.5 MG PO TABS: ORAL_TABLET | ORAL | Status: DC

## 2014-01-21 NOTE — Telephone Encounter (Signed)
Med filled and faxed.  

## 2014-01-21 NOTE — Telephone Encounter (Signed)
Last OV 07/28/13 Alprazolam last filled 11-11-13 #60 with 1

## 2014-01-23 ENCOUNTER — Encounter: Payer: Self-pay | Admitting: Family Medicine

## 2014-03-23 ENCOUNTER — Other Ambulatory Visit: Payer: Self-pay | Admitting: Family Medicine

## 2014-03-23 NOTE — Telephone Encounter (Signed)
Last OV 07-28-13 Alprazolam last filled 01/21/14 #60 with 1

## 2014-04-16 ENCOUNTER — Ambulatory Visit: Payer: Self-pay | Admitting: Cardiovascular Disease

## 2014-04-24 ENCOUNTER — Other Ambulatory Visit (HOSPITAL_COMMUNITY)
Admission: RE | Admit: 2014-04-24 | Discharge: 2014-04-24 | Disposition: A | Discharge status: Home / Self Care | Payer: 59 | Source: Ambulatory Visit | Attending: Family Medicine | Admitting: Family Medicine

## 2014-04-24 ENCOUNTER — Other Ambulatory Visit: Payer: Self-pay | Admitting: Family Medicine

## 2014-04-24 DIAGNOSIS — Z124 Encounter for screening for malignant neoplasm of cervix: Secondary | ICD-10-CM | POA: Diagnosis not present

## 2014-04-27 ENCOUNTER — Telehealth: Payer: Self-pay | Admitting: General Practice

## 2014-04-27 ENCOUNTER — Encounter: Payer: Self-pay | Admitting: Cardiovascular Disease

## 2014-04-27 NOTE — Telephone Encounter (Signed)
Last OV 07-28-13 (no upcoming appts) Alprazolam last filled 03-23-14 #60 with 0

## 2014-04-27 NOTE — Telephone Encounter (Signed)
Ok for #60 but pt needs to schedule her upcoming physical and please remind her that this is to be used as needed and not regularly

## 2014-04-28 LAB — CYTOLOGY - PAP

## 2014-04-28 MED ORDER — ALPRAZOLAM 0.5 MG PO TABS: 0.5000 mg | ORAL_TABLET | Freq: Two times a day (BID) | ORAL | Status: DC | PRN

## 2014-04-28 NOTE — Telephone Encounter (Signed)
Med filled and pt notified.  

## 2014-05-06 ENCOUNTER — Ambulatory Visit (INDEPENDENT_AMBULATORY_CARE_PROVIDER_SITE_OTHER): Payer: 59 | Admitting: Cardiovascular Disease

## 2014-05-06 ENCOUNTER — Encounter: Payer: Self-pay | Admitting: Cardiovascular Disease

## 2014-05-06 VITALS — BP 116/78 | HR 84 | Ht 64.0 in | Wt 201.0 lb

## 2014-05-06 DIAGNOSIS — I2510 Atherosclerotic heart disease of native coronary artery without angina pectoris: Secondary | ICD-10-CM | POA: Diagnosis not present

## 2014-05-06 DIAGNOSIS — E785 Hyperlipidemia, unspecified: Secondary | ICD-10-CM

## 2014-05-06 NOTE — Patient Instructions (Addendum)
Your physician recommends that you continue on your current medications as directed. Please refer to the Current Medication list given to you today.  Your physician recommends that you return for lab work in: 6 months for a lipid and liver panel. Nothing to eat or drink after midnight. Lab opens at 7:30 am. 11/06/2014 7:30 am  Your physician wants you to follow-up in: 1 year with Dr. Burt Knack. You will receive a reminder letter in the mail two months in advance. If you don't receive a letter, please call our office to schedule the follow-up appointment.

## 2014-05-06 NOTE — Progress Notes (Signed)
Cardiology Office Note   Date:  05/06/2014   ID:  Lillith, Mcneff 09-07-1960, MRN [id number]  PCP:  Simona Huh, MD  Cardiologist:  Sherren Mocha, MD    No chief complaint on file.   History of Present Illness: Maria Bishop is a 54 y.o. female who presents for follow-up of coronary artery disease. She presented with a non-ST elevation infarction in 2013 and underwent stenting of the left circumflex at that time. She had a repeat heart catheterization in 2014 demonstrating patency of her stent site.   She has a lot of problems related to work stress. She was having chest pain, but has made some changes with her work and things are better now. Also had some shortness of breath, but this has improved. She's physically active, but not engaged in regular exercise. Taking ASA regularly, but no other cardiac medication because of intolerances to many meds. She's eating healthier and LDL has come down from 144 to 112 mg/dL on most recent labs.   Past Medical History  Diagnosis Date  . Myocardial infarction     ~39yrs ago  . Degenerative disc disease   . Cervical cancer   . NSTEMI (non-ST elevated myocardial infarction) 06/24/2011    likely spontaneous dissection/ruptured plaque in OM3 - PCI: 3 overlapping Promus DES to OM3, EF 55%  . CAD (coronary artery disease)     Past Surgical History  Procedure Laterality Date  . Appendectomy    . Tubal ligation    . Liane Comber bunionectomy right  February 2015  . Left heart catheterization with coronary angiogram N/A 06/24/2011    Procedure: LEFT HEART CATHETERIZATION WITH CORONARY ANGIOGRAM;  Surgeon: Jettie Booze, MD;  Location: [facility] CATH LAB;  Service: Cardiovascular;  Laterality: N/A;  . Percutaneous coronary stent intervention (pci-s) Right 06/24/2011    Procedure: PERCUTANEOUS CORONARY STENT INTERVENTION (PCI-S);  Surgeon: Jettie Booze, MD;  Location: [facility] CATH LAB;  Service: Cardiovascular;  Laterality: Right;     Current Outpatient Prescriptions  Medication Sig Dispense Refill  . ALPRAZolam (XANAX) 0.5 MG tablet Take 1 tablet (0.5 mg total) by mouth 2 (two) times daily as needed. for anxiety 60 tablet 0  . aspirin 81 MG tablet Take 81 mg by mouth daily.    . fexofenadine (ALLEGRA) 180 MG tablet Take 180 mg by mouth daily.    Marland Kitchen ibuprofen (ADVIL,MOTRIN) 200 MG tablet Take 800 mg by mouth every 6 (six) hours as needed. For pain    . nitroGLYCERIN (NITROSTAT) 0.4 MG SL tablet Place 1 tablet (0.4 mg total) under the tongue every 5 (five) minutes as needed. 25 tablet 3  . pantoprazole (PROTONIX) 40 MG tablet Take 1 tablet (40 mg total) by mouth daily. 30 tablet 6   No current facility-administered medications for this visit.    Allergies:   Shellfish allergy; Statins; and Zetia   Social History:  The patient  reports that she has quit smoking. She does not have any smokeless tobacco history on file. She reports that she drinks alcohol. She reports that she does not use illicit drugs.   Family History:  The patient's  family history includes Cancer in her other; Coronary artery disease in her mother; Diabetes in her other; Hyperlipidemia in her other.    ROS:  Please see the history of present illness.  Otherwise, review of systems is positive for chest pain, cough, DOE, abdominal pain, depression, back pain, dizziness, easy bruising, fatigue, snoring, constipation, nausea.  All other systems are  reviewed and negative.    PHYSICAL EXAM: VS:  BP 116/78 mmHg  Pulse 84  Ht 5\' 4"  (1.626 m)  Wt 201 lb (91.173 kg)  BMI 34.48 kg/m2 , BMI Body mass index is 34.48 kg/(m^2). GEN: Well nourished, well developed, in no acute distress HEENT: normal Neck: no JVD, no masses. No carotid bruits Cardiac: RRR without murmur or gallop                Respiratory:  clear to auscultation bilaterally, normal work of breathing GI: soft, nontender, nondistended, + BS MS: no deformity or atrophy Ext: no pretibial  edema, pedal pulses 2+= bilaterally Skin: warm and dry, no rash Neuro:  Strength and sensation are intact Psych: euthymic mood, full affect  EKG:  EKG  ordered today. The ekg ordered today shows NSR 83 bpm, nonspecific ST changes.  Recent Labs: 07/28/2013: ALT 15; BUN 14; Creatinine 0.7; Hemoglobin 13.8; Platelets 198.0; Potassium 3.6; Sodium 140; TSH 1.47   Lipid Panel     Component Value Date/Time   CHOL 204* 07/28/2013 0835   TRIG 115.0 07/28/2013 0835   HDL 37.30* 07/28/2013 0835   CHOLHDL 5 07/28/2013 0835   VLDL 23.0 07/28/2013 0835   LDLCALC 144* 07/28/2013 0835      Wt Readings from Last 3 Encounters:  05/06/14 201 lb (91.173 kg)  07/28/13 204 lb (92.534 kg)  07/08/13 204 lb (92.534 kg)     Cardiac Studies Reviewed: Cardiac catheterization 04/08/2012: Final Conclusions:  1. Continued patency of the stented segment in the left circumflex with no significant obstructive disease noted. 2. Widely patent left main, LAD, and right coronary arteries 3. Mild segmental contraction abnormality left ventricle with preserved overall LV systolic function  Recommendations: Continued medical management of CAD.  ASSESSMENT AND PLAN: 1.  CAD, native vessel: The patient is stable without symptoms of angina. She will continue on aspirin for antiplatelet therapy. She really feels quite well off of cardiac medications. She previously has experienced a lot of synovitis.  2. Hyperlipidemia: The patient is statin intolerant. She is followed in the lipid clinic. We discussed consideration of PCSK9 inhibitors. She is intolerant to lowest dose statin and zetia. She prefers to continue work on lifestyle modification. She's making good strides with reduction in LDL cholesterol. We reviewed specific strategies. Will repeat lipids and LFTs in 6 months.   Current medicines are reviewed with the patient today.  The patient does not have concerns regarding medicines.  The following changes have  been made:  no change  Labs/ tests ordered today include:   Orders Placed This Encounter  Procedures  . Hepatic function panel  . Lipid Profile  . EKG 12-Lead    Disposition:   FU one year.  Signed, Sherren Mocha, MD  05/06/2014 8:57 AM    [address], [place], [place]  [address] Phone: [phone]; Fax: [phone]

## 2014-05-18 ENCOUNTER — Other Ambulatory Visit: Payer: Self-pay

## 2014-05-18 DIAGNOSIS — Z1231 Encounter for screening mammogram for malignant neoplasm of breast: Secondary | ICD-10-CM

## 2014-05-25 ENCOUNTER — Other Ambulatory Visit: Payer: Self-pay | Admitting: Family Medicine

## 2014-05-25 NOTE — Telephone Encounter (Signed)
Med filled and faxed.  

## 2014-05-25 NOTE — Telephone Encounter (Signed)
Last OV 07-28-13, no upcoming appts Alprazolam last filled 04-28-14 #60 with 0

## 2014-06-11 ENCOUNTER — Ambulatory Visit
Admission: RE | Admit: 2014-06-11 | Discharge: 2014-06-11 | Disposition: A | Discharge status: Home / Self Care | Payer: 59 | Source: Ambulatory Visit

## 2014-06-11 DIAGNOSIS — Z1231 Encounter for screening mammogram for malignant neoplasm of breast: Secondary | ICD-10-CM

## 2014-06-22 ENCOUNTER — Other Ambulatory Visit: Payer: Self-pay | Admitting: General Practice

## 2014-06-22 ENCOUNTER — Encounter: Payer: Self-pay | Admitting: General Practice

## 2014-06-22 MED ORDER — ALPRAZOLAM 0.5 MG PO TABS: 0.5000 mg | ORAL_TABLET | Freq: Two times a day (BID) | ORAL | Status: DC | PRN

## 2014-06-26 ENCOUNTER — Other Ambulatory Visit: Payer: Self-pay | Admitting: Family Medicine

## 2014-06-26 NOTE — Telephone Encounter (Signed)
Rx called to pharmacy voicemail. 

## 2014-06-26 NOTE — Telephone Encounter (Signed)
Ok to send 60 tabs zero refills. 

## 2014-06-26 NOTE — Telephone Encounter (Signed)
Last OV 07/28/13 (CPE scheduled for October, Pt due for Cholesterol follow up) Alprazolam last filled 06-22-14 #60 with 0  Please advise

## 2014-07-30 ENCOUNTER — Telehealth: Payer: Self-pay | Admitting: Family Medicine

## 2014-07-30 NOTE — Telephone Encounter (Signed)
Ok to refill but pt needs CSC and should not take regularly- only as needed

## 2014-07-30 NOTE — Telephone Encounter (Signed)
Last OV 07-28-13, Next appt scheduled for 11/2014 (CPE) Alprazolam last filled 06-26-14 #60 with 0   CSC on file, NO UDS

## 2014-07-30 NOTE — Telephone Encounter (Signed)
Med filled, pt notified needs to pick up at front desk and make a follow up appointment for mood.

## 2014-07-31 NOTE — Telephone Encounter (Signed)
appt sched for 08/06/14 2:30, approved to use doc of day slot per Marj per below notes pt needing see for mood/meds.

## 2014-07-31 NOTE — Telephone Encounter (Signed)
Noted  

## 2014-08-06 ENCOUNTER — Ambulatory Visit (INDEPENDENT_AMBULATORY_CARE_PROVIDER_SITE_OTHER): Payer: 59 | Admitting: Family Medicine

## 2014-08-06 ENCOUNTER — Ambulatory Visit: Payer: 59 | Admitting: Family Medicine

## 2014-08-06 ENCOUNTER — Encounter: Payer: Self-pay | Admitting: Family Medicine

## 2014-08-06 VITALS — BP 122/82 | HR 85 | Temp 98.1°F | Resp 16 | Ht 62.0 in | Wt 197.1 lb

## 2014-08-06 DIAGNOSIS — F418 Other specified anxiety disorders: Secondary | ICD-10-CM | POA: Diagnosis not present

## 2014-08-06 MED ORDER — PANTOPRAZOLE SODIUM 40 MG PO TBEC: 40.0000 mg | DELAYED_RELEASE_TABLET | Freq: Every day | ORAL | Status: DC

## 2014-08-06 MED ORDER — FLUOXETINE HCL 20 MG PO TABS: 20.0000 mg | ORAL_TABLET | Freq: Every day | ORAL | Status: DC

## 2014-08-06 MED ORDER — TRAZODONE HCL 50 MG PO TABS: 25.0000 mg | ORAL_TABLET | Freq: Every evening | ORAL | Status: DC | PRN

## 2014-08-06 NOTE — Patient Instructions (Addendum)
Follow up in 4-6 weeks to recheck mood Start the Prozac daily Take the Trazodone nightly for sleep Try and find a stress outlet- you deserve it! Call with any questions or concerns Happy 4th of July!!!

## 2014-08-06 NOTE — Assessment & Plan Note (Signed)
Deteriorated.  Pt is no longer having relief on xanax twice daily.  Based on this, will start Prozac as this is more activating and tends to be weight neutral.  Continue alprazolam prn.  Due to insomnia component, start trazodone and monitor for improvement.  Stressed need for healthy outlet- exercise, art, writing, etc.  Will follow closely.  Pt expressed understanding and is in agreement w/ plan.

## 2014-08-06 NOTE — Progress Notes (Signed)
Pre visit review using our clinic review tool, if applicable. No additional management support is needed unless otherwise documented below in the visit note. 

## 2014-08-06 NOTE — Progress Notes (Signed)
   Subjective:    Patient ID: Maria Bishop, female    DOB: 1960/09/06, 54 y.o.   MRN: [id number]  HPI Depression/anxiety- chronic problem but worsening recently.  Pt is taking 1/2 alprazolam qAM, qlunch, 1 tab qPM.  Having a lot of trouble sleeping.  Tearful during OV today.  Reports stress at both home and work.  Son is back in prison, uncle passed away.  Pt is having serious difficulties at work.  Attempted to quit but boss refused her 2 week notice.  Pt is feeling stuck.  Very unhappy.  Angry.  Denies SI/HI.   Review of Systems For ROS see HPI     Objective:   Physical Exam  Constitutional: She is oriented to person, place, and time. She appears well-developed and well-nourished. No distress.  HENT:  Head: Normocephalic and atraumatic.  Eyes: Conjunctivae and EOM are normal. Pupils are equal, round, and reactive to light.  Neck: Normal range of motion. Neck supple. No thyromegaly present.  Cardiovascular: Normal rate, regular rhythm, normal heart sounds and intact distal pulses.   Pulmonary/Chest: Effort normal and breath sounds normal. No respiratory distress. She has no wheezes. She has no rales.  Lymphadenopathy:    She has no cervical adenopathy.  Neurological: She is alert and oriented to person, place, and time.  Skin: Skin is warm and dry.  Psychiatric:  Tearful, anxious, angry  Vitals reviewed.         Assessment & Plan:

## 2014-08-31 ENCOUNTER — Ambulatory Visit (INDEPENDENT_AMBULATORY_CARE_PROVIDER_SITE_OTHER): Payer: 59 | Admitting: Family Medicine

## 2014-08-31 ENCOUNTER — Encounter: Payer: Self-pay | Admitting: Family Medicine

## 2014-08-31 VITALS — BP 112/68 | HR 87 | Temp 97.8°F | Ht 62.0 in | Wt 198.2 lb

## 2014-08-31 DIAGNOSIS — G4700 Insomnia, unspecified: Secondary | ICD-10-CM

## 2014-08-31 DIAGNOSIS — F418 Other specified anxiety disorders: Secondary | ICD-10-CM | POA: Diagnosis not present

## 2014-08-31 MED ORDER — FLUOXETINE HCL 40 MG PO CAPS: 40.0000 mg | ORAL_CAPSULE | Freq: Every day | ORAL | Status: DC

## 2014-08-31 NOTE — Assessment & Plan Note (Signed)
Ongoing issue for pt.  No improvement w/ Trazodone- pt had side effects.  Sleep has mildly improved w/ addition of Prozac- she is now able to fall asleep using OTC sleep aides.  Will continue to follow.

## 2014-08-31 NOTE — Progress Notes (Signed)
Pre visit review using our clinic review tool, if applicable. No additional management support is needed unless otherwise documented below in the visit note. 

## 2014-08-31 NOTE — Assessment & Plan Note (Signed)
Improved since starting Prozac.  Pt is interested in increasing dose to 40mg  daily to improve sxs and possibly improve insomnia.  New prescription given.  Will continue to follow.  Pt expressed understanding and is in agreement w/ plan.

## 2014-08-31 NOTE — Progress Notes (Signed)
   Subjective:    Patient ID: Maria Bishop, female    DOB: 06/16/1960, 54 y.o.   MRN: [id number]  HPI Depression- pt reports mood is improving on Prozac.  No relief of insomnia w/ Trazodone.  Pt had 'horrible nightmares'.  Pt is feeling less tense, less overwhelmed.    Insomnia- no improvement after trazodone.  Had nightmares and AM hangover on Trazodone.  Pt is back to taking OTC sleep aide, benadryl and Alprazolam.  Pt reports she is able to get up and function w/o difficulty, 'i sleep good.  i wake up refreshed'.     Review of Systems For ROS see HPI     Objective:   Physical Exam  Constitutional: She is oriented to person, place, and time. She appears well-developed and well-nourished. No distress.  HENT:  Head: Normocephalic and atraumatic.  Eyes: Conjunctivae and EOM are normal. Pupils are equal, round, and reactive to light.  Musculoskeletal: She exhibits no edema.  Neurological: She is alert and oriented to person, place, and time.  Skin: Skin is warm and dry.  Psychiatric: She has a normal mood and affect. Her behavior is normal. Thought content normal.  Vitals reviewed.         Assessment & Plan:

## 2014-08-31 NOTE — Patient Instructions (Signed)
Schedule your complete physical for after March 18th Increase the Prozac to 40mg  daily- 2 of what you have at home and one of the new prescription Call with any questions or concerns You look great!  Hang in there!!!

## 2014-09-27 ENCOUNTER — Other Ambulatory Visit: Payer: Self-pay | Admitting: Family Medicine

## 2014-09-28 ENCOUNTER — Encounter: Payer: Self-pay | Admitting: Family Medicine

## 2014-09-28 NOTE — Telephone Encounter (Signed)
Last OV 08/31/14 Alprazolam last filled 07/30/14 #60 with 0

## 2014-09-28 NOTE — Telephone Encounter (Signed)
Medication filled to pharmacy as requested.   

## 2014-10-22 ENCOUNTER — Encounter: Payer: Self-pay | Admitting: Family Medicine

## 2014-10-22 ENCOUNTER — Other Ambulatory Visit: Payer: Self-pay | Admitting: Family Medicine

## 2014-10-22 DIAGNOSIS — R413 Other amnesia: Secondary | ICD-10-CM

## 2014-10-29 ENCOUNTER — Encounter: Payer: Self-pay | Admitting: Family Medicine

## 2014-10-29 ENCOUNTER — Ambulatory Visit (INDEPENDENT_AMBULATORY_CARE_PROVIDER_SITE_OTHER): Payer: 59 | Admitting: Family Medicine

## 2014-10-29 ENCOUNTER — Telehealth: Payer: Self-pay | Admitting: Family Medicine

## 2014-10-29 VITALS — BP 126/82 | HR 95 | Temp 98.0°F | Resp 16 | Wt 202.1 lb

## 2014-10-29 DIAGNOSIS — B9689 Other specified bacterial agents as the cause of diseases classified elsewhere: Secondary | ICD-10-CM

## 2014-10-29 DIAGNOSIS — J019 Acute sinusitis, unspecified: Secondary | ICD-10-CM | POA: Diagnosis not present

## 2014-10-29 MED ORDER — AMOXICILLIN 875 MG PO TABS: 875.0000 mg | ORAL_TABLET | Freq: Two times a day (BID) | ORAL | Status: DC

## 2014-10-29 NOTE — Patient Instructions (Signed)
Follow up as needed Start the Amoxicillin twice daily- take w/ food Drink plenty of fluids Continue daily antihistamine Call with any questions or concerns ENJOY THE [place]!!!

## 2014-10-29 NOTE — Progress Notes (Signed)
Pre visit review using our clinic review tool, if applicable. No additional management support is needed unless otherwise documented below in the visit note. 

## 2014-10-29 NOTE — Telephone Encounter (Signed)
Noted  

## 2014-10-29 NOTE — Assessment & Plan Note (Signed)
New.  Pt's sxs and PE consistent w/ infxn.  No relief w/ OTC antihistamine.  Start abx.  Reviewed supportive care and red flags that should prompt return. s Pt expressed understanding and is in agreement w/ plan.

## 2014-10-29 NOTE — Telephone Encounter (Signed)
Shiquita left msg for pt to call back and schedule (pref today at 3:30 as mentioned below).   Can one of you please schedule pt at 3:30 for 30 minutes and call and inform pt?         Jessica.         ----- Message -----     From: [name]     Sent: 10/29/2014  9:05 AM      To: [facility] Clinical Pool    Subject: Non-Urgent Medical Question                 Dr. Birdie Riddle,        Good Morning, sorry to bother you today but I need to know if I can get in to see you or could you call me in a z-Pack. I have a scratchy throat, dizziness, stopped up head and inflammation in my right ear. I have had this since last Friday. I have taken Musinex Dm and IBprof. My daughters wedding is Saturday and we are leaving tomorrow. Please any suggestions.....        Thanks     [employer]

## 2014-10-29 NOTE — Progress Notes (Signed)
   Subjective:    Patient ID: Maria Bishop, female    DOB: 09/23/1960, 54 y.o.   MRN: [id number]  HPI Ear pain- 'my head is in a cloud'.  sxs started 6 days ago.  No known fevers.  2 days of diarrhea.  + nasal congestion, sinus pressure, L ear pain.  No tooth pain.  Taking Mucinex DM w/ minimal improvement.  + dizziness.  + sick contacts   Review of Systems For ROS see HPI     Objective:   Physical Exam  Constitutional: She appears well-developed and well-nourished. No distress.  HENT:  Head: Normocephalic and atraumatic.  Right Ear: Tympanic membrane normal.  Left Ear: Tympanic membrane normal.  Nose: Mucosal edema and rhinorrhea present. Right sinus exhibits maxillary sinus tenderness and frontal sinus tenderness. Left sinus exhibits no maxillary sinus tenderness and no frontal sinus tenderness.  Mouth/Throat: Uvula is midline and mucous membranes are normal. Posterior oropharyngeal erythema present. No oropharyngeal exudate.  Eyes: Conjunctivae and EOM are normal. Pupils are equal, round, and reactive to light.  Neck: Normal range of motion. Neck supple.  Cardiovascular: Normal rate, regular rhythm and normal heart sounds.   Pulmonary/Chest: Effort normal and breath sounds normal. No respiratory distress. She has no wheezes.  Lymphadenopathy:    She has no cervical adenopathy.  Vitals reviewed.         Assessment & Plan:

## 2014-11-05 ENCOUNTER — Encounter: Payer: Self-pay | Admitting: Family Medicine

## 2014-11-06 ENCOUNTER — Other Ambulatory Visit (INDEPENDENT_AMBULATORY_CARE_PROVIDER_SITE_OTHER): Payer: 59 | Admitting: *Deleted

## 2014-11-06 DIAGNOSIS — I2510 Atherosclerotic heart disease of native coronary artery without angina pectoris: Secondary | ICD-10-CM

## 2014-11-06 DIAGNOSIS — E785 Hyperlipidemia, unspecified: Secondary | ICD-10-CM

## 2014-11-06 LAB — LIPID PANEL
Cholesterol: 217 mg/dL — ABNORMAL HIGH (ref 0–200)
HDL: 37.3 mg/dL — ABNORMAL LOW (ref >39.00)
LDL Cholesterol: 150 mg/dL — ABNORMAL HIGH (ref 0–99)
NonHDL: 179.97
Total CHOL/HDL Ratio: 6
Triglycerides: 150 mg/dL — ABNORMAL HIGH (ref 0.0–149.0)
VLDL: 30 mg/dL (ref 0.0–40.0)

## 2014-11-06 LAB — HEPATIC FUNCTION PANEL
ALT: 17 U/L (ref 0–35)
AST: 19 U/L (ref 0–37)
Albumin: 4.3 g/dL (ref 3.5–5.2)
Alkaline Phosphatase: 67 U/L (ref 39–117)
Bilirubin, Direct: 0.1 mg/dL (ref 0.0–0.3)
Total Bilirubin: 0.6 mg/dL (ref 0.2–1.2)
Total Protein: 7 g/dL (ref 6.0–8.3)

## 2014-11-09 ENCOUNTER — Encounter: Payer: Self-pay | Admitting: Cardiovascular Disease

## 2014-11-09 NOTE — Telephone Encounter (Signed)
Left message for patient to call me to schedule lipid clinic appointment

## 2014-11-10 ENCOUNTER — Ambulatory Visit: Payer: 59 | Admitting: Pharmacist

## 2014-11-11 ENCOUNTER — Ambulatory Visit (INDEPENDENT_AMBULATORY_CARE_PROVIDER_SITE_OTHER): Payer: 59 | Admitting: Pharmacist

## 2014-11-11 DIAGNOSIS — E785 Hyperlipidemia, unspecified: Secondary | ICD-10-CM

## 2014-11-11 NOTE — Progress Notes (Signed)
Maria Bishop is a 54 yo F patient referred to lipid clinic by Dr. Burt Knack due to h/o multiple MIs and inability to tolerate multiple lipid lowering medications.  Patient failed crestor 5 mg qd, lipitor 40 mg, simvastatin 40 mg qd,  Zetia 10 mg qd, and pravastatin 10 mg qd.  She has also tried taking metamucil pills 1-2 times daily and taking True Heart 1 tablet twice daily (a phytosterol and Co-Q supplement) but had to stop these due to constipation and no real effect on her cholesterol.  She does states she has had more stress and depression like symptoms this year.  She is now working everyday at a medical office, and due to time restraints isn't working out as much.  She has tried to change her eating patterns to include more yogurt and fruit to help with constipation and bloating.     Risk Factors:  MI x 2 (most recent 2013), family h/o CAD - LDL goal < 70, non-HDL goal < 100 Meds:  none Intolerant:  Lipitor 40 mg qd- mylagias (09/12/11-10/03/11), Simvastatin 40 mg qd- myalgias (06/26/11-09/12/11), Crestor 5 mg qd -myalgias (03/25/12-04/18/12), Pravastatin 10 mg qd- muscle aches (01/15/13-04/23/13), Zetia- depression and constipation- (11/06/12-01/15/13).  Family history:  Mother had CAD in her 58's, and had 15-20 MI per patient.  She was also a heavy smoker. Social history:  Patient is a previous smoker - not smoking currently.  Drinks wine occasionally only.   Labs:   10/2014: LDL 150, TC 217, TG 150, HDL 37, LFTs normal (not on lipid lowering meds) 04/2013:  LDL 142, TC 198, TG 105, HDL 35 (phytosterols and metamucil only) 01/2013  LDL 145, TC 200, HDL 37, TG 88, LFTs normal (not on lipid lowering meds). 07/2012 - Vitamin D 25-OH normal.  Current Outpatient Prescriptions  Medication Sig Dispense Refill  . ALPRAZolam (XANAX) 0.5 MG tablet TAKE ONE TABLET BY MOUTH TWICE DAILY ONLY AS NEEDED 60 tablet 0  . amoxicillin (AMOXIL) 875 MG tablet Take 1 tablet (875 mg total) by mouth 2 (two) times daily. 20  tablet 0  . aspirin 81 MG tablet Take 81 mg by mouth daily.    . fexofenadine (ALLEGRA) 180 MG tablet Take 180 mg by mouth daily.    Marland Kitchen FLUoxetine (PROZAC) 40 MG capsule Take 1 capsule (40 mg total) by mouth daily. 30 capsule 6  . ibuprofen (ADVIL,MOTRIN) 200 MG tablet Take 800 mg by mouth every 6 (six) hours as needed. For pain    . nitroGLYCERIN (NITROSTAT) 0.4 MG SL tablet Place 1 tablet (0.4 mg total) under the tongue every 5 (five) minutes as needed. (Patient not taking: Reported on 10/29/2014) 25 tablet 3  . pantoprazole (PROTONIX) 40 MG tablet Take 1 tablet (40 mg total) by mouth daily. 30 tablet 6  . traZODone (DESYREL) 50 MG tablet Take 0.5-1 tablets (25-50 mg total) by mouth at bedtime as needed for sleep. (Patient not taking: Reported on 10/29/2014) 30 tablet 3   No current facility-administered medications for this visit.   Allergies  Allergen Reactions  . Shellfish Allergy   . Statins     Failed Crestor 5 mg qd, Simvastatin 40 mg, and Lipitor 40 mg, and pravastatin 10 mg qd due to muscle and joint pain  . Zetia [Ezetimibe]     Depression and constipation per patient   Assessment and Plan  1.  Hyperlipidemia- Pt has tried and failed multiple statins and Zetia.  Her LDL remains above goal of < 70  mg/dL.  She has already had multiple MI and has a strong family history so important to treat LDL aggressively.  TG are normal so no role for fenofibrate or Niacin.  She has a history of constipation and so BAS not a good option.  Only option that will help patient reach her LDL goal at this time is PCSK-9.  Discussed therapy with patient.  She is willing to try this.  Will start prior authorization paperwork for her.

## 2014-11-15 ENCOUNTER — Other Ambulatory Visit: Payer: Self-pay | Admitting: Family Medicine

## 2014-11-16 NOTE — Telephone Encounter (Signed)
Request for refill on: Alprazolam  Last Rx: 08.22.16, #60x0 Last OV: 09.22.16 Return: F/U as needed Please advise on refills/SLS

## 2014-11-17 ENCOUNTER — Encounter: Payer: 59 | Admitting: Family Medicine

## 2014-12-02 ENCOUNTER — Other Ambulatory Visit (INDEPENDENT_AMBULATORY_CARE_PROVIDER_SITE_OTHER): Payer: 59

## 2014-12-02 ENCOUNTER — Ambulatory Visit (INDEPENDENT_AMBULATORY_CARE_PROVIDER_SITE_OTHER): Payer: 59 | Admitting: Neurology

## 2014-12-02 ENCOUNTER — Encounter: Payer: Self-pay | Admitting: Neurology

## 2014-12-02 VITALS — BP 110/80 | HR 94 | Resp 16 | Ht 62.0 in | Wt 203.0 lb

## 2014-12-02 DIAGNOSIS — R413 Other amnesia: Secondary | ICD-10-CM

## 2014-12-02 DIAGNOSIS — F329 Major depressive disorder, single episode, unspecified: Secondary | ICD-10-CM

## 2014-12-02 DIAGNOSIS — I252 Old myocardial infarction: Secondary | ICD-10-CM | POA: Insufficient documentation

## 2014-12-02 DIAGNOSIS — Z8541 Personal history of malignant neoplasm of cervix uteri: Secondary | ICD-10-CM | POA: Diagnosis not present

## 2014-12-02 DIAGNOSIS — F32A Depression, unspecified: Secondary | ICD-10-CM

## 2014-12-02 LAB — TSH: TSH: 1.69 u[IU]/mL (ref 0.35–4.50)

## 2014-12-02 LAB — VITAMIN B12: Vitamin B-12: 233 pg/mL (ref 211–911)

## 2014-12-02 NOTE — Patient Instructions (Addendum)
1. Schedule MRI brain with and without contrast 2. Bloodwork for TSH, B12 3. Continue working with Dr. Birdie Riddle on the depression 4. Physical exercise and brain stimulation exercises are important for brain health

## 2014-12-02 NOTE — Progress Notes (Signed)
NEUROLOGY CONSULTATION NOTE  Maria Bishop MRN: [id number] DOB: 14-Feb-1960  Referring provider: Dr. Annye Asa Primary care provider: V  Reason for consult:  Memory changes, confusion  Dear Dr Birdie Riddle:  Thank you for your kind referral of Maria Bishop for consultation of the above symptoms. Although her history is well known to you, please allow me to reiterate it for the purpose of our medical record. Records and images were personally reviewed where available.  HISTORY OF PRESENT ILLNESS: This is a pleasant 53 year old right-handed woman with a history of hyperlipidemia, CAD s/p MI x 2 (2001 and 2013), cervical cancer, migraines, presenting for evaluation of worsening memory. 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 She was in a party one time and went to introduce family, but could not recall the name of her daughter's husband and cousin. She has forgotten several times that she has put something on the stove and burned food. She has noticed more times where she cannot recall what she was doing or what she went into a room to get. She lives with her boyfriend, who has noticed as well that she would stop mid-sentence and cannot find the word or what she was trying to say. She uses incorrect words. She has forgotten to pay bills in the past. She denies getting lost driving or missing her medications. She is concerned as well due to family history of Alzheimer's disease in her uncle. Her mother and maternal aunt have memory issues as well. A step-sister had a brain tumor. She denies any significant head injuries. She occasionally drinks alcohol (beer or wine a couple of days a week).  She has had headaches for several years, with pressure-like pain over the vertex, occurring a couple of times a  week. They can last for a whole day. Ibuprofen sometimes helps. She has had a couple of migraines as well with associated nausea and photophobia. She can have dizziness with the headaches, but also has frequent dizziness when standing. No falls. She denies any vision changes, diplopia, dysarthria, dysphagia, bowel/bladder dysfunction. She has chronic neck and back pain. She has occasional numbness in the 2nd digit of her right foot and right hip pain/sciatica. She reports 6 hours of unrefreshing sleep, she has been told she snores loudly, and has daytime drowsiness. She has depression and was switched to Prozac a few months ago. She feels that it is not enough, she continues to feel that she is not content and happy. She was diagnosed with cervical cancer in her 60s, most recent Pap smears have been normal.   PAST MEDICAL HISTORY: Past Medical History  Diagnosis Date  . Myocardial infarction ([place])     ~66yrs ago  . Degenerative disc disease   . Cervical cancer ([place])   . NSTEMI (non-ST elevated myocardial infarction) ([place]) 06/24/2011    likely spontaneous dissection/ruptured plaque in OM3 - PCI: 3 overlapping Promus DES to OM3, EF 55%  . CAD (coronary artery disease)     PAST SURGICAL HISTORY: Past Surgical History  Procedure Laterality Date  . Appendectomy    . Tubal ligation    . Liane Comber bunionectomy right  February 2015  . Left heart catheterization with coronary angiogram N/A 06/24/2011    Procedure: LEFT HEART CATHETERIZATION WITH CORONARY ANGIOGRAM;  Surgeon: Jettie Booze,  MD;  Location: [facility] CATH LAB;  Service: Cardiovascular;  Laterality: N/A;  . Percutaneous coronary stent intervention (pci-s) Right 06/24/2011    Procedure: PERCUTANEOUS CORONARY STENT INTERVENTION (PCI-S);  Surgeon: Jettie Booze, MD;  Location: [facility] CATH LAB;  Service: Cardiovascular;  Laterality: Right;    MEDICATIONS: Current Outpatient Prescriptions on File Prior to Visit  Medication Sig Dispense Refill    . ALPRAZolam (XANAX) 0.5 MG tablet TAKE ONE TABLET BY MOUTH TWICE DAILY AS NEEDED 60 tablet 0  . aspirin 81 MG tablet Take 81 mg by mouth daily.    . fexofenadine (ALLEGRA) 180 MG tablet Take 180 mg by mouth daily.    Marland Kitchen FLUoxetine (PROZAC) 40 MG capsule Take 1 capsule (40 mg total) by mouth daily. 30 capsule 6  . ibuprofen (ADVIL,MOTRIN) 200 MG tablet Take 800 mg by mouth every 6 (six) hours as needed. For pain    . pantoprazole (PROTONIX) 40 MG tablet Take 1 tablet (40 mg total) by mouth daily. 30 tablet 6  . nitroGLYCERIN (NITROSTAT) 0.4 MG SL tablet Place 1 tablet (0.4 mg total) under the tongue every 5 (five) minutes as needed. (Patient not taking: Reported on 10/29/2014) 25 tablet 3   No current facility-administered medications on file prior to visit.    ALLERGIES: Allergies  Allergen Reactions  . Shellfish Allergy   . Statins     Failed Crestor 5 mg qd, Simvastatin 40 mg, and Lipitor 40 mg, and pravastatin 10 mg qd due to muscle and joint pain  . Zetia [Ezetimibe]     Depression and constipation per patient    FAMILY HISTORY: Family History  Problem Relation Age of Onset  . Coronary artery disease Mother     MI in her 72s  . Hyperlipidemia Other   . Diabetes Other   . Cancer Other   . Alzheimer's disease Maternal Uncle     SOCIAL HISTORY: Social History   Social History  . Marital Status: Single    Spouse Name: N/A  . Number of Children: 2  . Years of Education: N/A   Occupational History  . Customer service manager    Social History Main Topics  . Smoking status: Former Smoker    Types: Cigarettes  . Smokeless tobacco: Never Used  . Alcohol Use: 0.0 oz/week    0 Standard drinks or equivalent per week     Comment: 1-2 times a week  . Drug Use: No  . Sexual Activity: Not on file   Other Topics Concern  . Not on file   Social History Narrative    REVIEW OF SYSTEMS: Constitutional: No fevers, chills, or sweats, no generalized fatigue, change in  appetite Eyes: No visual changes, double vision, eye pain Ear, nose and throat: No hearing loss, ear pain, nasal congestion, sore throat Cardiovascular: No chest pain, palpitations Respiratory:  No shortness of breath at rest or with exertion, wheezes GastrointestinaI: No nausea, vomiting, diarrhea, abdominal pain, fecal incontinence Genitourinary:  No dysuria, urinary retention or frequency Musculoskeletal:  + neck pain, back pain Integumentary: No rash, pruritus, skin lesions Neurological: as above Psychiatric: +depression, no insomnia, anxiety Endocrine: No palpitations, fatigue, diaphoresis, mood swings, change in appetite, change in weight, increased thirst Hematologic/Lymphatic:  No anemia, purpura, petechiae. Allergic/Immunologic: no itchy/runny eyes, nasal congestion, recent allergic reactions, rashes  PHYSICAL EXAM: Filed Vitals:   12/02/14 0850  BP: 110/80  Pulse: 94  Resp: 16   General: No acute distress Head:  Normocephalic/atraumatic Eyes: Fundoscopic exam shows bilateral sharp discs,  no vessel changes, exudates, or hemorrhages Neck: supple, no paraspinal tenderness, full range of motion Back: No paraspinal tenderness Heart: regular rate and rhythm Lungs: Clear to auscultation bilaterally. Vascular: No carotid bruits. Skin/Extremities: No rash, no edema Neurological Exam: Mental status: alert and oriented to person, place, and time, no dysarthria or aphasia, Fund of knowledge is appropriate.  Recent and remote memory are intact.  Attention and concentration are normal.    Able to name objects and repeat phrases.  Montreal Cognitive Assessment  12/02/2014  Visuospatial/ Executive (0/5) 5  Naming (0/3) 3  Attention: Read list of digits (0/2) 1  Attention: Read list of letters (0/1) 1  Attention: Serial 7 subtraction starting at 100 (0/3) 3  Language: Repeat phrase (0/2) 2  Language : Fluency (0/1) 1  Abstraction (0/2) 2  Delayed Recall (0/5) 3  Orientation (0/6)  6  Total 27  Adjusted Score (based on education) 27   Cranial nerves: CN I: not tested CN II: pupils equal, round and reactive to light, visual fields intact, fundi unremarkable. CN III, IV, VI:  full range of motion, no nystagmus, no ptosis CN V: facial sensation intact CN VII: upper and lower face symmetric CN VIII: hearing intact to finger rub CN IX, X: gag intact, uvula midline CN XI: sternocleidomastoid and trapezius muscles intact CN XII: tongue midline Bulk & Tone: normal, no fasciculations. Motor: 5/5 throughout with no pronator drift. Sensation: intact to light touch, cold, pin, vibration and joint position sense.  No extinction to double simultaneous stimulation.  Romberg test negative Deep Tendon Reflexes: +2 throughout, no ankle clonus Plantar responses: downgoing bilaterally Cerebellar: no incoordination on finger to nose, heel to shin. No dysdiadochokinesia Gait: narrow-based and steady, able to tandem walk adequately. Tremor: none  IMPRESSION: This is a pleasant 55 year old right-handed woman with a history of hyperlipidemia, CAD s/p MI x 2, cervical cancer, depression, migraines, presenting for worsening memory over the past year. Her neurological exam is normal, MOCA score normal 27/30. We discussed different causes of memory loss. Check TSH and B12. MRI brain without contrast will be ordered to assess for underlying structural abnormality and assess vascular load, particularly with her history of 2 MIs and cervical cancer. 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 We discussed further treatment with consideration for psychotherapy in addition to medication. We discussed the importance of control of vascular risk factors, physical exercise, and brain stimulation exercises for brain health. If imaging results are abnormal, she will be scheduled for a follow-up, otherwise if tests  are normal, we discussed further treatment of depression and follow-up on a prn basis.   Thank you for allowing me to participate in the care of this patient. Please do not hesitate to call for any questions or concerns.   Maria Bishop, M.D.  CC: Dr. Birdie Riddle

## 2014-12-03 ENCOUNTER — Telehealth: Payer: Self-pay | Admitting: Family Medicine

## 2014-12-03 NOTE — Telephone Encounter (Signed)
Pt returned your call/Call back @ [phone]

## 2014-12-03 NOTE — Telephone Encounter (Signed)
-----   Message from Cameron Sprang, MD sent at 12/02/2014  4:21 PM EDT ----- Pls let her know thyroid and B12 levels are normal. Thanks

## 2014-12-03 NOTE — Telephone Encounter (Signed)
Lmovm to rtn my call. 

## 2014-12-04 NOTE — Telephone Encounter (Signed)
Lmovm to rtn my call. 

## 2014-12-04 NOTE — Telephone Encounter (Signed)
Patient returned my call. Notified her of results. 

## 2014-12-09 ENCOUNTER — Telehealth: Payer: Self-pay | Admitting: Pharmacist

## 2014-12-09 MED ORDER — ALIROCUMAB 75 MG/ML ~~LOC~~ SOPN: 75.0000 mg | PEN_INJECTOR | SUBCUTANEOUS | Status: DC

## 2014-12-09 NOTE — Telephone Encounter (Signed)
Spoke with pt.  She was approved for Praluent for 12/02/14-06/02/15.  Her PBM is [employer] so will send in the prescription to [place].  Pt aware to call the office when she receives the drug so we can set up the [place].

## 2014-12-10 ENCOUNTER — Telehealth: Payer: Self-pay | Admitting: General Practice

## 2014-12-10 ENCOUNTER — Other Ambulatory Visit: Payer: Self-pay | Admitting: Family Medicine

## 2014-12-10 ENCOUNTER — Encounter: Payer: Self-pay | Admitting: Family Medicine

## 2014-12-10 ENCOUNTER — Ambulatory Visit (HOSPITAL_COMMUNITY)
Admission: RE | Admit: 2014-12-10 | Discharge: 2014-12-10 | Disposition: A | Discharge status: Home / Self Care | Payer: 59 | Source: Ambulatory Visit | Attending: Neurology | Admitting: Neurology

## 2014-12-10 DIAGNOSIS — R413 Other amnesia: Secondary | ICD-10-CM | POA: Insufficient documentation

## 2014-12-10 DIAGNOSIS — N950 Postmenopausal bleeding: Secondary | ICD-10-CM

## 2014-12-10 DIAGNOSIS — Z8541 Personal history of malignant neoplasm of cervix uteri: Secondary | ICD-10-CM | POA: Diagnosis present

## 2014-12-10 MED ORDER — GADOBENATE DIMEGLUMINE 529 MG/ML IV SOLN
20.0000 mL | Freq: Once | INTRAVENOUS | Status: AC | PRN
  Administered 2014-12-10: 20 mL via INTRAVENOUS

## 2014-12-10 NOTE — Telephone Encounter (Signed)
Received a mychart message from pt in regards to vaginal bleeding. Called and spoke with pt. She advised that she has been bleeding the past 2 days, pt is having lower abdominal/pelvic pain 4/10 and radiating lower back pain about the same intensity. Pt has not had a cycle in 2 years and is not complaining of dysuria. Pt states the bleeding is most prevalent when she uses restroom and wipes.   Due to pt being postmenopausal PCP requested that pt see a GYN. Pt no longer has GYN at previous practice and is requesting a new referral. This was placed as urgent due to age and symptoms.

## 2014-12-10 NOTE — Telephone Encounter (Signed)
Agree- pt needs GYN evaluation in the setting of post-menopausal bleeding

## 2014-12-14 ENCOUNTER — Telehealth: Payer: Self-pay | Admitting: Family Medicine

## 2014-12-14 NOTE — Telephone Encounter (Signed)
Patient was notified of result. 

## 2014-12-14 NOTE — Telephone Encounter (Signed)
-----   Message from Cameron Sprang, MD sent at 12/10/2014  2:21 PM EDT ----- Pls let her know MRI brain is normal, no evidence of tumor, stroke, or bleed. Thanks

## 2014-12-17 ENCOUNTER — Encounter: Payer: Self-pay | Admitting: Pharmacist

## 2014-12-24 ENCOUNTER — Telehealth: Payer: Self-pay | Admitting: Pharmacist

## 2014-12-24 ENCOUNTER — Ambulatory Visit: Payer: Self-pay | Admitting: Women's Health

## 2014-12-24 DIAGNOSIS — E785 Hyperlipidemia, unspecified: Secondary | ICD-10-CM

## 2014-12-24 NOTE — Telephone Encounter (Signed)
Received a call from pt saying she received her Praluent in the mail and gave her first injection yesterday 12/23/14. Scheduled lab work on 02/04/15, this will be after pt has received 4 doses of medication.

## 2014-12-29 ENCOUNTER — Other Ambulatory Visit: Payer: Self-pay | Admitting: Family Medicine

## 2014-12-29 NOTE — Telephone Encounter (Signed)
Medication filled to pharmacy as requested.   

## 2014-12-29 NOTE — Telephone Encounter (Signed)
Last ov 10/29/14 Alprazolam last filled 11/16/14 #60 with 0

## 2015-02-03 NOTE — Addendum Note (Signed)
Addended by: Eulis Foster on: 02/03/2015 04:04 PM   Modules accepted: Orders

## 2015-02-04 ENCOUNTER — Other Ambulatory Visit (INDEPENDENT_AMBULATORY_CARE_PROVIDER_SITE_OTHER): Payer: 59 | Admitting: *Deleted

## 2015-02-04 DIAGNOSIS — E785 Hyperlipidemia, unspecified: Secondary | ICD-10-CM

## 2015-02-04 LAB — LIPID PANEL
Cholesterol: 116 mg/dL — ABNORMAL LOW (ref 125–200)
HDL: 29 mg/dL — ABNORMAL LOW (ref >=46)
LDL Cholesterol: 66 mg/dL (ref <130)
Total CHOL/HDL Ratio: 4 Ratio (ref <=5.0)
Triglycerides: 103 mg/dL (ref <150)
VLDL: 21 mg/dL (ref <30)

## 2015-02-04 LAB — HEPATIC FUNCTION PANEL
ALT: 16 U/L (ref 6–29)
AST: 16 U/L (ref 10–35)
Albumin: 4.3 g/dL (ref 3.6–5.1)
Alkaline Phosphatase: 60 U/L (ref 33–130)
Bilirubin, Direct: 0.1 mg/dL (ref <=0.2)
Indirect Bilirubin: 0.5 mg/dL (ref 0.2–1.2)
Total Bilirubin: 0.6 mg/dL (ref 0.2–1.2)
Total Protein: 6.6 g/dL (ref 6.1–8.1)

## 2015-02-13 ENCOUNTER — Encounter: Payer: Self-pay | Admitting: Family Medicine

## 2015-02-17 ENCOUNTER — Other Ambulatory Visit: Payer: Self-pay | Admitting: Family Medicine

## 2015-02-17 MED ORDER — PANTOPRAZOLE SODIUM 40 MG PO TBEC: 40.0000 mg | DELAYED_RELEASE_TABLET | Freq: Every day | ORAL | Status: DC

## 2015-02-17 MED ORDER — FLUOXETINE HCL 40 MG PO CAPS: 40.0000 mg | ORAL_CAPSULE | Freq: Every day | ORAL | Status: DC

## 2015-02-17 NOTE — Telephone Encounter (Signed)
protonix and prozac sent to new [facility] per PCP instructions and patient mychart request.

## 2015-02-18 ENCOUNTER — Encounter: Payer: Self-pay | Admitting: Cardiovascular Disease

## 2015-02-25 ENCOUNTER — Encounter: Payer: Self-pay | Admitting: Pharmacist

## 2015-02-26 ENCOUNTER — Other Ambulatory Visit: Payer: Self-pay | Admitting: Pharmacist

## 2015-02-26 ENCOUNTER — Encounter: Payer: Self-pay | Admitting: Pharmacist

## 2015-02-26 MED ORDER — ALIROCUMAB 75 MG/ML ~~LOC~~ SOPN: 75.0000 mg | PEN_INJECTOR | SUBCUTANEOUS | Status: DC

## 2015-03-09 ENCOUNTER — Encounter: Payer: Self-pay | Admitting: Pharmacist

## 2015-03-24 ENCOUNTER — Encounter: Payer: Self-pay | Admitting: Family Medicine

## 2015-03-25 MED ORDER — ALPRAZOLAM 0.5 MG PO TABS: 0.5000 mg | ORAL_TABLET | Freq: Two times a day (BID) | ORAL | Status: DC | PRN

## 2015-03-25 NOTE — Telephone Encounter (Signed)
Medication filled to pharmacy as requested.   

## 2015-03-25 NOTE — Telephone Encounter (Signed)
Last OV 10/29/14 Alprazolam last filled 12/29/14 #60 with 1

## 2015-04-07 ENCOUNTER — Telehealth: Payer: Self-pay | Admitting: Family Medicine

## 2015-04-07 NOTE — Telephone Encounter (Signed)
Left message for patient to call about Flu Shot

## 2015-04-08 ENCOUNTER — Telehealth: Payer: Self-pay | Admitting: Family Medicine

## 2015-04-08 NOTE — Telephone Encounter (Signed)
Patient declined Flu Shot °

## 2015-04-08 NOTE — Telephone Encounter (Signed)
Chart updated to reflect 

## 2015-04-27 ENCOUNTER — Telehealth: Payer: Self-pay

## 2015-04-27 NOTE — Telephone Encounter (Signed)
Left message for patient to return call to update chart for CPE scheduled for tomorrow.

## 2015-04-28 ENCOUNTER — Encounter: Payer: Self-pay | Admitting: Family Medicine

## 2015-05-22 ENCOUNTER — Other Ambulatory Visit: Payer: Self-pay | Admitting: Family Medicine

## 2015-05-24 NOTE — Telephone Encounter (Signed)
Medication filled to pharmacy as requested.   

## 2015-06-02 ENCOUNTER — Encounter: Payer: Self-pay | Admitting: Family Medicine

## 2015-06-02 ENCOUNTER — Ambulatory Visit (INDEPENDENT_AMBULATORY_CARE_PROVIDER_SITE_OTHER): Payer: BLUE CROSS/BLUE SHIELD | Admitting: Family Medicine

## 2015-06-02 VITALS — BP 118/78 | HR 82 | Temp 97.9°F | Resp 16 | Ht 62.0 in | Wt 198.0 lb

## 2015-06-02 DIAGNOSIS — Z0000 Encounter for general adult medical examination without abnormal findings: Secondary | ICD-10-CM

## 2015-06-02 LAB — CBC WITH DIFFERENTIAL/PLATELET
Basophils Absolute: 0 10*3/uL (ref 0.0–0.1)
Basophils Relative: 0.4 % (ref 0.0–3.0)
Eosinophils Absolute: 0.1 10*3/uL (ref 0.0–0.7)
Eosinophils Relative: 1.9 % (ref 0.0–5.0)
HCT: 40.6 % (ref 36.0–46.0)
Hemoglobin: 13.8 g/dL (ref 12.0–15.0)
Lymphocytes Relative: 23.9 % (ref 12.0–46.0)
Lymphs Abs: 1.1 10*3/uL (ref 0.7–4.0)
MCHC: 34.1 g/dL (ref 30.0–36.0)
MCV: 90.1 fl (ref 78.0–100.0)
Monocytes Absolute: 0.3 10*3/uL (ref 0.1–1.0)
Monocytes Relative: 6.6 % (ref 3.0–12.0)
Neutro Abs: 3 10*3/uL (ref 1.4–7.7)
Neutrophils Relative %: 67.2 % (ref 43.0–77.0)
Platelets: 234 10*3/uL (ref 150.0–400.0)
RBC: 4.5 Mil/uL (ref 3.87–5.11)
RDW: 14.1 % (ref 11.5–15.5)
WBC: 4.4 10*3/uL (ref 4.0–10.5)

## 2015-06-02 LAB — BASIC METABOLIC PANEL
BUN: 17 mg/dL (ref 6–23)
CO2: 27 mEq/L (ref 19–32)
Calcium: 9.5 mg/dL (ref 8.4–10.5)
Chloride: 106 mEq/L (ref 96–112)
Creatinine, Ser: 0.75 mg/dL (ref 0.40–1.20)
GFR: 86.08 mL/min (ref >60.00)
Glucose, Bld: 88 mg/dL (ref 70–99)
Potassium: 4 mEq/L (ref 3.5–5.1)
Sodium: 141 mEq/L (ref 135–145)

## 2015-06-02 LAB — LIPID PANEL
Cholesterol: 124 mg/dL (ref 0–200)
HDL: 41.6 mg/dL (ref >39.00)
LDL Cholesterol: 63 mg/dL (ref 0–99)
NonHDL: 82.45
Total CHOL/HDL Ratio: 3
Triglycerides: 95 mg/dL (ref 0.0–149.0)
VLDL: 19 mg/dL (ref 0.0–40.0)

## 2015-06-02 LAB — HEPATIC FUNCTION PANEL
ALT: 17 U/L (ref 0–35)
AST: 19 U/L (ref 0–37)
Albumin: 4.5 g/dL (ref 3.5–5.2)
Alkaline Phosphatase: 64 U/L (ref 39–117)
Bilirubin, Direct: 0.2 mg/dL (ref 0.0–0.3)
Total Bilirubin: 0.8 mg/dL (ref 0.2–1.2)
Total Protein: 7.1 g/dL (ref 6.0–8.3)

## 2015-06-02 LAB — VITAMIN D 25 HYDROXY (VIT D DEFICIENCY, FRACTURES): VITD: 22.09 ng/mL — ABNORMAL LOW (ref 30.00–100.00)

## 2015-06-02 LAB — TSH: TSH: 2.73 u[IU]/mL (ref 0.35–4.50)

## 2015-06-02 NOTE — Progress Notes (Signed)
   Subjective:    Patient ID: Maria Bishop, female    DOB: [birth year], 55 y.o.   MRN: [id number]  HPI CPE- UTD on pap ([facility]) and mammo.  Due for colonoscopy- 'no', open to idea of Cologuard.   Review of Systems Patient reports no vision/ hearing changes, adenopathy,fever, weight change,  persistant/recurrent hoarseness , swallowing issues, chest pain, palpitations, edema, persistant/recurrent cough, hemoptysis, dyspnea (rest/exertional/paroxysmal nocturnal), gastrointestinal bleeding (melena, rectal bleeding), abdominal pain, significant heartburn, bowel changes, GU symptoms (dysuria, hematuria, incontinence), Gyn symptoms (abnormal  bleeding, pain),  syncope, focal weakness, memory loss, numbness & tingling, skin/hair/nail changes, abnormal bruising or bleeding, anxiety, or depression.     Objective:   Physical Exam General Appearance:    Alert, cooperative, no distress, appears stated age  Head:    Normocephalic, without obvious abnormality, atraumatic  Eyes:    PERRL, conjunctiva/corneas clear, EOM's intact, fundi    benign, both eyes  Ears:    Normal TM's and external ear canals, both ears  Nose:   Nares normal, septum midline, mucosa normal, no drainage    or sinus tenderness  Throat:   Lips, mucosa, and tongue normal; teeth and gums normal  Neck:   Supple, symmetrical, trachea midline, no adenopathy;    Thyroid: no enlargement/tenderness/nodules  Back:     Symmetric, no curvature, ROM normal, no CVA tenderness  Lungs:     Clear to auscultation bilaterally, respirations unlabored  Chest Wall:    No tenderness or deformity   Heart:    Regular rate and rhythm, S1 and S2 normal, no murmur, rub   or gallop  Breast Exam:    Deferred to GYN  Abdomen:     Soft, non-tender, bowel sounds active all four quadrants,    no masses, no organomegaly  Genitalia:    Deferred to GYN  Rectal:    Extremities:   Extremities normal, atraumatic, no cyanosis or edema  Pulses:   2+ and  symmetric all extremities  Skin:   Skin color, texture, turgor normal, no rashes or lesions  Lymph nodes:   Cervical, supraclavicular, and axillary nodes normal  Neurologic:   CNII-XII intact, normal strength, sensation and reflexes    throughout          Assessment & Plan:

## 2015-06-02 NOTE — Assessment & Plan Note (Signed)
Pt's PE WNL.  UTD on pap, mammo.  Pt declines colonoscopy- open to idea of cologuard.  Check labs.  Anticipatory guidance provided.

## 2015-06-02 NOTE — Patient Instructions (Signed)
Follow up in 1 year or as needed We'll notify you of your lab results and make any changes if needed Keep up the good work on healthy diet and regular exercise- you look great! Call with any questions or concerns Thanks for sticking with [place]! Happy Spring!!!

## 2015-06-02 NOTE — Progress Notes (Signed)
Pre visit review using our clinic review tool, if applicable. No additional management support is needed unless otherwise documented below in the visit note. 

## 2015-06-03 ENCOUNTER — Other Ambulatory Visit: Payer: Self-pay | Admitting: General Practice

## 2015-06-03 MED ORDER — VITAMIN D (ERGOCALCIFEROL) 1.25 MG (50000 UNIT) PO CAPS: 50000.0000 [IU] | ORAL_CAPSULE | ORAL | Status: DC

## 2015-06-05 ENCOUNTER — Encounter: Payer: Self-pay | Admitting: Family Medicine

## 2015-06-17 ENCOUNTER — Encounter: Payer: Self-pay | Admitting: Family Medicine

## 2015-06-18 ENCOUNTER — Encounter: Payer: Self-pay | Admitting: Family Medicine

## 2015-06-18 ENCOUNTER — Ambulatory Visit (INDEPENDENT_AMBULATORY_CARE_PROVIDER_SITE_OTHER): Payer: BLUE CROSS/BLUE SHIELD | Admitting: Family Medicine

## 2015-06-18 VITALS — BP 122/76 | HR 78 | Temp 98.5°F | Resp 16 | Ht 62.0 in | Wt 197.2 lb

## 2015-06-18 DIAGNOSIS — J019 Acute sinusitis, unspecified: Secondary | ICD-10-CM

## 2015-06-18 DIAGNOSIS — B9689 Other specified bacterial agents as the cause of diseases classified elsewhere: Secondary | ICD-10-CM

## 2015-06-18 MED ORDER — AMOXICILLIN 875 MG PO TABS: 875.0000 mg | ORAL_TABLET | Freq: Two times a day (BID) | ORAL | Status: DC

## 2015-06-18 MED ORDER — GUAIFENESIN-CODEINE 100-10 MG/5ML PO SYRP: 10.0000 mL | ORAL_SOLUTION | Freq: Three times a day (TID) | ORAL | Status: DC | PRN

## 2015-06-18 NOTE — Progress Notes (Signed)
   Subjective:    Patient ID: Maria Bishop, female    DOB: 09/27/1960, 55 y.o.   MRN: [id number]  HPI URI- sxs started 1 week ago w/ nasal congestion, PND, sore throat.  + cough.  Using Mucinex DM, OTC allergy medication, Robitussin at night, Halls cough drops.  + laryngitis, sinus pressure, dizziness, R ear pressure.  No fevers.  No known sick contacts.   Review of Systems For ROS see HPI     Objective:   Physical Exam  Constitutional: She appears well-developed and well-nourished. No distress.  HENT:  Head: Normocephalic and atraumatic.  Right Ear: Tympanic membrane normal.  Left Ear: Tympanic membrane normal.  Nose: Mucosal edema and rhinorrhea present. Right sinus exhibits maxillary sinus tenderness and frontal sinus tenderness. Left sinus exhibits maxillary sinus tenderness and frontal sinus tenderness.  Mouth/Throat: Uvula is midline and mucous membranes are normal. Posterior oropharyngeal erythema present. No oropharyngeal exudate.  Eyes: Conjunctivae and EOM are normal. Pupils are equal, round, and reactive to light.  Neck: Normal range of motion. Neck supple.  Cardiovascular: Normal rate, regular rhythm and normal heart sounds.   Pulmonary/Chest: Effort normal and breath sounds normal. No respiratory distress. She has no wheezes.  Lymphadenopathy:    She has no cervical adenopathy.  Vitals reviewed.         Assessment & Plan:

## 2015-06-18 NOTE — Patient Instructions (Signed)
Follow up as needed Start the Amoxicillin twice daily- take w/ food Drink plenty of fluids Continue the allergy medication daily Use the cough syrup for nights/weekends- will cause drowsiness! REST!!! Call with any questions or concerns Hang in there!!!

## 2015-06-18 NOTE — Progress Notes (Signed)
Pre visit review using our clinic review tool, if applicable. No additional management support is needed unless otherwise documented below in the visit note. 

## 2015-06-18 NOTE — Assessment & Plan Note (Signed)
Pt's PE consistent w/ infxn.  Start abx.  Cough meds prn.  Reviewed supportive care and red flags that should prompt return.  Pt expressed understanding and is in agreement w/ plan.

## 2015-07-02 LAB — COLOGUARD: Cologuard: NEGATIVE

## 2015-07-06 ENCOUNTER — Encounter: Payer: Self-pay | Admitting: Family Medicine

## 2015-07-07 MED ORDER — SULFAMETHOXAZOLE-TRIMETHOPRIM 800-160 MG PO TABS: 1.0000 | ORAL_TABLET | Freq: Two times a day (BID) | ORAL | Status: DC

## 2015-07-15 ENCOUNTER — Encounter: Payer: Self-pay | Admitting: Family Medicine

## 2015-07-21 ENCOUNTER — Encounter: Payer: Self-pay | Admitting: General Practice

## 2015-08-05 ENCOUNTER — Encounter: Payer: Self-pay | Admitting: Family Medicine

## 2015-08-06 ENCOUNTER — Ambulatory Visit (INDEPENDENT_AMBULATORY_CARE_PROVIDER_SITE_OTHER): Payer: BLUE CROSS/BLUE SHIELD | Admitting: Family Medicine

## 2015-08-06 ENCOUNTER — Encounter: Payer: Self-pay | Admitting: Family Medicine

## 2015-08-06 VITALS — BP 124/81 | HR 92 | Temp 98.2°F | Resp 16 | Ht 62.0 in | Wt 199.1 lb

## 2015-08-06 DIAGNOSIS — M5416 Radiculopathy, lumbar region: Secondary | ICD-10-CM | POA: Diagnosis not present

## 2015-08-06 MED ORDER — PREDNISONE 10 MG PO TABS: ORAL_TABLET | ORAL | Status: DC

## 2015-08-06 MED ORDER — CYCLOBENZAPRINE HCL 10 MG PO TABS: 10.0000 mg | ORAL_TABLET | Freq: Three times a day (TID) | ORAL | Status: DC | PRN

## 2015-08-06 NOTE — Assessment & Plan Note (Signed)
Ongoing issue.  Worsened recently.  Pt is not interested in PT at this time.  Start Pred taper, flexeril.  Heating pad prn.  Reviewed supportive care and red flags that should prompt return.  Pt expressed understanding and is in agreement w/ plan.

## 2015-08-06 NOTE — Progress Notes (Signed)
Pre visit review using our clinic review tool, if applicable. No additional management support is needed unless otherwise documented below in the visit note. 

## 2015-08-06 NOTE — Patient Instructions (Signed)
Follow up as needed Start the Prednisone as directed- take w/ food Use the flexeril as needed for spasm- will cause drowsiness so best for nights/weekends Heating pad! Call with any questions or concerns Hang in there!!! Happy 4th!!!

## 2015-08-06 NOTE — Progress Notes (Signed)
   Subjective:    Patient ID: Maria Bishop, female    DOB: August 01, 1960, 55 y.o.   MRN: [id number]  HPI LBP w/ radiculopathy- 'it's never hurt this bad before'.  Waking her at night.  Taking '4 ibuprofen and 2 tylenol' w/ minimal relief.  sxs started a few months ago w/ intermittent pain.  Pain has been more notable lately and radiating into L leg.  'it hits randomly'.  Has been stretching to try and improve pain.  No weakness of legs or bowel/bladder incontinence.  Pt has known DDD L4/L5   Review of Systems For ROS see HPI     Objective:   Physical Exam  Constitutional: She is oriented to person, place, and time. She appears well-developed and well-nourished. No distress.  HENT:  Head: Normocephalic and atraumatic.  Cardiovascular: Intact distal pulses.   Musculoskeletal: She exhibits tenderness (TTP over lumbar spine and L paraspinal muscles). She exhibits no edema.  Neurological: She is alert and oriented to person, place, and time. She has normal reflexes. No cranial nerve deficit. Coordination normal.  (-) SLR bilaterally  Skin: Skin is warm and dry.  Psychiatric: She has a normal mood and affect. Her behavior is normal. Thought content normal.  Vitals reviewed.         Assessment & Plan:

## 2015-08-22 ENCOUNTER — Other Ambulatory Visit: Payer: Self-pay | Admitting: Family Medicine

## 2015-08-23 NOTE — Telephone Encounter (Signed)
Medication filled to pharmacy as requested.   

## 2015-08-23 NOTE — Telephone Encounter (Signed)
Last OV 08/06/15 Alprazolam last filled 03/25/15 #90 with 1 prozac last filled 05/24/15 #90 with 0

## 2015-09-06 ENCOUNTER — Encounter: Payer: Self-pay | Admitting: Family Medicine

## 2015-09-24 ENCOUNTER — Encounter: Payer: Self-pay | Admitting: Family Medicine

## 2015-09-24 DIAGNOSIS — M545 Low back pain: Secondary | ICD-10-CM

## 2015-09-24 MED ORDER — MELOXICAM 15 MG PO TABS: 15.0000 mg | ORAL_TABLET | Freq: Every day | ORAL | 0 refills | Status: DC

## 2015-09-28 ENCOUNTER — Encounter: Payer: Self-pay | Admitting: Family Medicine

## 2015-09-29 ENCOUNTER — Encounter: Payer: Self-pay | Admitting: Family Medicine

## 2015-09-29 MED ORDER — TRAMADOL HCL 50 MG PO TABS: 50.0000 mg | ORAL_TABLET | Freq: Three times a day (TID) | ORAL | 0 refills | Status: DC | PRN

## 2015-10-08 ENCOUNTER — Encounter: Payer: Self-pay | Admitting: Family Medicine

## 2015-10-08 ENCOUNTER — Ambulatory Visit (INDEPENDENT_AMBULATORY_CARE_PROVIDER_SITE_OTHER): Payer: BLUE CROSS/BLUE SHIELD | Admitting: Family Medicine

## 2015-10-08 VITALS — BP 113/73 | HR 101 | Temp 98.6°F | Resp 16 | Ht 62.0 in | Wt 203.2 lb

## 2015-10-08 DIAGNOSIS — L723 Sebaceous cyst: Secondary | ICD-10-CM | POA: Diagnosis not present

## 2015-10-08 NOTE — Progress Notes (Signed)
Pre visit review using our clinic review tool, if applicable. No additional management support is needed unless otherwise documented below in the visit note. 

## 2015-10-08 NOTE — Patient Instructions (Signed)
This is sebaceous cyst and there is no evidence of infection or area to drain today Apply hot compresses to the area TRY NOT TO POP OR PICK!!! Call with any questions or concerns Happy Labor Day!!!

## 2015-10-08 NOTE — Progress Notes (Signed)
   Subjective:    Patient ID: Maria Bishop, female    DOB: 10/18/1960, 55 y.o.   MRN: [id number]  HPI Cyst- pt reports she has a hx of 'fatty cysts'.  Pt had a similar one in her armpit and it got infected and 'i don't want it to happen again'.  She noticed the area on Monday- it was very painful.  Area is smaller and less painful today but still present.  Not draining.   Review of Systems For ROS see HPI     Objective:   Physical Exam  Constitutional: She is oriented to person, place, and time. She appears well-developed and well-nourished. No distress.  Neurological: She is alert and oriented to person, place, and time.  Skin: Skin is warm and dry.  Small, firm but freely mobile sebaceous cyst in L axilla w/o overlying redness or surface pocket  Psychiatric: She has a normal mood and affect. Her behavior is normal. Thought content normal.  Vitals reviewed.         Assessment & Plan:  Sebaceous cyst- new.  No evidence of infection, no pus pocket to drain today.  Area is resolving spontaneously.  Reviewed supportive care and red flags that should prompt return.  Pt expressed understanding and is in agreement w/ plan.

## 2015-11-11 ENCOUNTER — Encounter: Payer: Self-pay | Admitting: Family Medicine

## 2015-11-11 ENCOUNTER — Telehealth: Payer: Self-pay | Admitting: Cardiovascular Disease

## 2015-11-11 NOTE — Telephone Encounter (Signed)
New message      Pt c/o medication issue:  1. Name of Medication: robaxin and diclofenac 2. How are you currently taking this medication (dosage and times per day)? Robaxin 500mg  1 tab every 6-8hrs as need for back pain; diclofenac 75mg  1 tablet bid with food for inflammation 3. Are you having a reaction (difficulty breathing--STAT)? no  4. What is your medication issue? Medication was prescribed by orthopedic doctor.  Pt has been taking medication for approx 1 month.  She is starting to have chest tightness---more in afternoon/night (usually take xanax for this) and pulse in the mid 90-100.  Pt states her bp is good.  Could this be a side effect of the medication or should pt see Dr Burt Knack.

## 2015-11-11 NOTE — Telephone Encounter (Signed)
LMTCB

## 2015-11-12 ENCOUNTER — Encounter: Payer: Self-pay | Admitting: Family Medicine

## 2015-11-12 NOTE — Telephone Encounter (Signed)
F/u Message  Pt call requesting to speak with RN. About previous message. Pt states she is walking for call from RN to respond to Dr Birdie Riddle. Please call back to discuss

## 2015-11-12 NOTE — Telephone Encounter (Signed)
I spoke with the pt and she has been taking Robaxin and Diclofenac for 1 month due to back pain.  Her symptoms have recently started to improve and she started tapering down on these medications about a week ago.  Over the past 3-4 days the pt has had a headache, constant dull chest tightness and increased heart rate.  The pt said she does have a history of migraine headaches.  I reviewed schedules and we did not have availability today in the office. I scheduled the pt to see Truitt Merle NP on 11/15/15. I advised that she proceed to closest Urgent Care or ER for further evaluation is symptoms change or worsen.  Pt agreed with plan.

## 2015-11-15 ENCOUNTER — Ambulatory Visit: Payer: BLUE CROSS/BLUE SHIELD | Admitting: Nurse Practitioner

## 2015-11-15 NOTE — Progress Notes (Deleted)
CARDIOLOGY OFFICE NOTE  Date:  11/15/2015    [name] Date of Birth: 06/29/1960 Medical Record [id number]  PCP:  Annye Asa, MD  Cardiologist:  Servando Snare & ***    No chief complaint on file.   History of Present Illness: Sharlita Felter is a 55 y.o. female who presents today for a ***   Comes in today. Here with   Past Medical History:  Diagnosis Date  . CAD (coronary artery disease)   . Cervical cancer ([place])   . Degenerative disc disease   . Myocardial infarction    ~25yrs ago  . NSTEMI (non-ST elevated myocardial infarction) ([place]) 06/24/2011   likely spontaneous dissection/ruptured plaque in OM3 - PCI: 3 overlapping Promus DES to OM3, EF 55%    Past Surgical History:  Procedure Laterality Date  . APPENDECTOMY    . [place] bunionectomy right  February 2015  . LEFT HEART CATHETERIZATION WITH CORONARY ANGIOGRAM N/A 06/24/2011   Procedure: LEFT HEART CATHETERIZATION WITH CORONARY ANGIOGRAM;  Surgeon: Jettie Booze, MD;  Location: [facility] CATH LAB;  Service: Cardiovascular;  Laterality: N/A;  . PERCUTANEOUS CORONARY STENT INTERVENTION (PCI-S) Right 06/24/2011   Procedure: PERCUTANEOUS CORONARY STENT INTERVENTION (PCI-S);  Surgeon: Jettie Booze, MD;  Location: [facility] CATH LAB;  Service: Cardiovascular;  Laterality: Right;  . TUBAL LIGATION       Medications: Current Outpatient Prescriptions  Medication Sig Dispense Refill  . Alirocumab (PRALUENT) 75 MG/ML SOPN Inject 75 mg into the skin every 14 (fourteen) days. 2 pen 11  . ALPRAZolam (XANAX) 0.5 MG tablet TAKE 1 TABLET BY MOUTH 2 TIMES DAILY AS NEEDED 90 tablet 1  . aspirin 81 MG tablet Take 81 mg by mouth daily.    . diclofenac (VOLTAREN) 75 MG EC tablet TAKE 1 TABLET BY MOUTH 2 TIMES DAILY WITH FOOD  3  . fexofenadine (ALLEGRA) 180 MG tablet Take 180 mg by mouth daily. Reported on 06/02/2015    . FLUoxetine (PROZAC) 40 MG capsule TAKE 1 CAPSULE BY MOUTH EVERY DAY 90 capsule 3  . ibuprofen  (ADVIL,MOTRIN) 200 MG tablet Take 800 mg by mouth every 6 (six) hours as needed. For pain    . methocarbamol (ROBAXIN) 500 MG tablet TAKE 1 TABLET BY MOUTH EVERY 6 TO 8 HOURS AS NEEDED FOR PAIN OR SPASM  0  . nitroGLYCERIN (NITROSTAT) 0.4 MG SL tablet Place 1 tablet (0.4 mg total) under the tongue every 5 (five) minutes as needed. 25 tablet 3  . pantoprazole (PROTONIX) 40 MG tablet TAKE 1 TABLET BY MOUTH EVERY DAY 90 tablet 2  . traMADol (ULTRAM) 50 MG tablet Take 1 tablet (50 mg total) by mouth every 8 (eight) hours as needed. 30 tablet 0   No current facility-administered medications for this visit.     Allergies: Allergies  Allergen Reactions  . Shellfish Allergy   . Statins     Failed Crestor 5 mg qd, Simvastatin 40 mg, and Lipitor 40 mg, and pravastatin 10 mg qd due to muscle and joint pain  . Zetia [Ezetimibe]     Depression and constipation per patient  . Mobic [Meloxicam] Rash    Social History: The patient  reports that she has quit smoking. Her smoking use included Cigarettes. She has never used smokeless tobacco. She reports that she drinks alcohol. She reports that she does not use drugs.   Family History: The patient's ***family history includes Alzheimer's disease in her maternal uncle; Cancer in her other; Coronary artery disease in  her mother; Diabetes in her other; Hyperlipidemia in her other.   Review of Systems: Please see the history of present illness.   Otherwise, the review of systems is positive for {NONE DEFAULTED:18576::"none"}.   All other systems are reviewed and negative.   Physical Exam: VS:  LMP 07/05/2012  .  BMI There is no height or weight on file to calculate BMI.  Wt Readings from Last 3 Encounters:  10/08/15 203 lb 4 oz (92.2 kg)  08/06/15 199 lb 2 oz (90.3 kg)  06/18/15 197 lb 4 oz (89.5 kg)    General: Pleasant. Well developed, well nourished and in no acute distress.   HEENT: Normal.  Neck: Supple, no JVD, carotid bruits, or masses  noted.  Cardiac: ***Regular rate and rhythm. No murmurs, rubs, or gallops. No edema.  Respiratory:  Lungs are clear to auscultation bilaterally with normal work of breathing.  GI: Soft and nontender.  MS: No deformity or atrophy. Gait and ROM intact.  Skin: Warm and dry. Color is normal.  Neuro:  Strength and sensation are intact and no gross focal deficits noted.  Psych: Alert, appropriate and with normal affect.   LABORATORY DATA:  EKG:  EKG {ACTION; IS/IS [id number] ordered today. This demonstrates ***.  Lab Results  Component Value Date   WBC 4.4 06/02/2015   HGB 13.8 06/02/2015   HCT 40.6 06/02/2015   PLT 234.0 06/02/2015   GLUCOSE 88 06/02/2015   CHOL 124 06/02/2015   TRIG 95.0 06/02/2015   HDL 41.60 06/02/2015   LDLCALC 63 06/02/2015   ALT 17 06/02/2015   AST 19 06/02/2015   NA 141 06/02/2015   K 4.0 06/02/2015   CL 106 06/02/2015   CREATININE 0.75 06/02/2015   BUN 17 06/02/2015   CO2 27 06/02/2015   TSH 2.73 06/02/2015   INR 1.0 04/04/2012   HGBA1C 5.0 06/24/2011    BNP (last 3 results) No results for input(s): BNP in the last 8760 hours.  ProBNP (last 3 results) No results for input(s): PROBNP in the last 8760 hours.   Other Studies Reviewed Today:   Assessment/Plan:   Current medicines are reviewed with the patient today.  The patient does not have concerns regarding medicines other than what has been noted above.  The following changes have been made:  See above.  Labs/ tests ordered today include:   No orders of the defined types were placed in this encounter.    Disposition:   FU with *** in {gen number [id number] {Days to years:10300}.   Patient is agreeable to this plan and will call if any problems develop in the interim.   Signed: Burtis Junes, RN, ANP-C 11/15/2015 7:39 AM  [facility] [address] [place], [place]  [address] Phone: [phone] Fax: [phone]

## 2015-11-23 ENCOUNTER — Other Ambulatory Visit: Payer: Self-pay | Admitting: Family Medicine

## 2015-12-20 ENCOUNTER — Other Ambulatory Visit: Payer: Self-pay | Admitting: Family Medicine

## 2015-12-20 NOTE — Telephone Encounter (Signed)
Last OV 10/08/15 Alprazolam last filled 08/23/15 #90 with 1

## 2016-01-11 ENCOUNTER — Encounter: Payer: Self-pay | Admitting: Family Medicine

## 2016-01-25 ENCOUNTER — Emergency Department (HOSPITAL_COMMUNITY): Payer: BLUE CROSS/BLUE SHIELD

## 2016-01-25 ENCOUNTER — Telehealth: Payer: Self-pay | Admitting: Family Medicine

## 2016-01-25 ENCOUNTER — Observation Stay (HOSPITAL_COMMUNITY)
Admission: EM | Admit: 2016-01-25 | Discharge: 2016-01-27 | Disposition: A | Discharge status: Home / Self Care | Payer: BLUE CROSS/BLUE SHIELD | Attending: Cardiovascular Disease | Admitting: Cardiovascular Disease

## 2016-01-25 ENCOUNTER — Telehealth: Payer: Self-pay

## 2016-01-25 ENCOUNTER — Telehealth: Payer: Self-pay | Admitting: Cardiovascular Disease

## 2016-01-25 ENCOUNTER — Encounter (HOSPITAL_COMMUNITY): Payer: Self-pay | Admitting: Emergency Medicine

## 2016-01-25 DIAGNOSIS — F419 Anxiety disorder, unspecified: Secondary | ICD-10-CM | POA: Diagnosis not present

## 2016-01-25 DIAGNOSIS — I10 Essential (primary) hypertension: Secondary | ICD-10-CM | POA: Diagnosis not present

## 2016-01-25 DIAGNOSIS — I2542 Coronary artery dissection: Secondary | ICD-10-CM | POA: Diagnosis not present

## 2016-01-25 DIAGNOSIS — I200 Unstable angina: Secondary | ICD-10-CM | POA: Diagnosis not present

## 2016-01-25 DIAGNOSIS — R000 Tachycardia, unspecified: Secondary | ICD-10-CM | POA: Diagnosis not present

## 2016-01-25 DIAGNOSIS — E876 Hypokalemia: Secondary | ICD-10-CM | POA: Insufficient documentation

## 2016-01-25 DIAGNOSIS — Z955 Presence of coronary angioplasty implant and graft: Secondary | ICD-10-CM | POA: Diagnosis not present

## 2016-01-25 DIAGNOSIS — R079 Chest pain, unspecified: Secondary | ICD-10-CM | POA: Diagnosis not present

## 2016-01-25 DIAGNOSIS — I2510 Atherosclerotic heart disease of native coronary artery without angina pectoris: Secondary | ICD-10-CM | POA: Diagnosis not present

## 2016-01-25 DIAGNOSIS — K219 Gastro-esophageal reflux disease without esophagitis: Secondary | ICD-10-CM | POA: Diagnosis not present

## 2016-01-25 DIAGNOSIS — I252 Old myocardial infarction: Secondary | ICD-10-CM

## 2016-01-25 DIAGNOSIS — E785 Hyperlipidemia, unspecified: Secondary | ICD-10-CM | POA: Diagnosis present

## 2016-01-25 DIAGNOSIS — F329 Major depressive disorder, single episode, unspecified: Secondary | ICD-10-CM | POA: Diagnosis not present

## 2016-01-25 DIAGNOSIS — Z7982 Long term (current) use of aspirin: Secondary | ICD-10-CM | POA: Diagnosis not present

## 2016-01-25 DIAGNOSIS — Z79899 Other long term (current) drug therapy: Secondary | ICD-10-CM | POA: Insufficient documentation

## 2016-01-25 DIAGNOSIS — Z87891 Personal history of nicotine dependence: Secondary | ICD-10-CM | POA: Insufficient documentation

## 2016-01-25 LAB — CBC
HCT: 39.8 % (ref 36.0–46.0)
Hemoglobin: 14 g/dL (ref 12.0–15.0)
MCH: 31.5 pg (ref 26.0–34.0)
MCHC: 35.2 g/dL (ref 30.0–36.0)
MCV: 89.6 fL (ref 78.0–100.0)
Platelets: 244 10*3/uL (ref 150–400)
RBC: 4.44 MIL/uL (ref 3.87–5.11)
RDW: 13.3 % (ref 11.5–15.5)
WBC: 6.1 10*3/uL (ref 4.0–10.5)

## 2016-01-25 LAB — BASIC METABOLIC PANEL WITH GFR
Anion gap: 7 (ref 5–15)
BUN: 9 mg/dL (ref 6–20)
CO2: 26 mmol/L (ref 22–32)
Calcium: 9.6 mg/dL (ref 8.9–10.3)
Chloride: 108 mmol/L (ref 101–111)
Creatinine, Ser: 0.75 mg/dL (ref 0.44–1.00)
GFR calc Af Amer: >60 mL/min
GFR calc non Af Amer: >60 mL/min
Glucose, Bld: 101 mg/dL — ABNORMAL HIGH (ref 65–99)
Potassium: 3.8 mmol/L (ref 3.5–5.1)
Sodium: 141 mmol/L (ref 135–145)

## 2016-01-25 LAB — HEPARIN LEVEL (UNFRACTIONATED): Heparin Unfractionated: 0.44 IU/mL (ref 0.30–0.70)

## 2016-01-25 LAB — I-STAT TROPONIN, ED: Troponin i, poc: 0 ng/mL (ref 0.00–0.08)

## 2016-01-25 LAB — TROPONIN I: Troponin I: <0.03 ng/mL

## 2016-01-25 MED ORDER — HEPARIN (PORCINE) IN NACL 100-0.45 UNIT/ML-% IJ SOLN
850.0000 [IU]/h | INTRAMUSCULAR | Status: DC
  Administered 2016-01-25 – 2016-01-26 (×2): 850 [IU]/h via INTRAVENOUS
  Filled 2016-01-25 (×2): qty 250

## 2016-01-25 MED ORDER — ASPIRIN 81 MG PO CHEW
324.0000 mg | CHEWABLE_TABLET | Freq: Once | ORAL | Status: AC
  Administered 2016-01-25: 324 mg via ORAL
  Filled 2016-01-25: qty 4

## 2016-01-25 MED ORDER — ONDANSETRON HCL 4 MG/2ML IJ SOLN: 4.0000 mg | Freq: Four times a day (QID) | INTRAMUSCULAR | Status: DC | PRN

## 2016-01-25 MED ORDER — PANTOPRAZOLE SODIUM 40 MG PO TBEC
40.0000 mg | DELAYED_RELEASE_TABLET | Freq: Every day | ORAL | Status: DC
  Administered 2016-01-26 – 2016-01-27 (×2): 40 mg via ORAL
  Filled 2016-01-25 (×2): qty 1

## 2016-01-25 MED ORDER — ACETAMINOPHEN 325 MG PO TABS
650.0000 mg | ORAL_TABLET | ORAL | Status: DC | PRN
  Administered 2016-01-25 – 2016-01-26 (×2): 650 mg via ORAL
  Filled 2016-01-25 (×3): qty 2

## 2016-01-25 MED ORDER — SODIUM CHLORIDE 0.9 % WEIGHT BASED INFUSION
3.0000 mL/kg/h | INTRAVENOUS | Status: AC
  Administered 2016-01-26: 3 mL/kg/h via INTRAVENOUS

## 2016-01-25 MED ORDER — SODIUM CHLORIDE 0.9 % WEIGHT BASED INFUSION
1.0000 mL/kg/h | INTRAVENOUS | Status: DC
  Administered 2016-01-26: 1 mL/kg/h via INTRAVENOUS

## 2016-01-25 MED ORDER — ASPIRIN 81 MG PO CHEW
81.0000 mg | CHEWABLE_TABLET | Freq: Every day | ORAL | Status: DC
  Administered 2016-01-27: 81 mg via ORAL
  Filled 2016-01-25: qty 1

## 2016-01-25 MED ORDER — ALPRAZOLAM 0.5 MG PO TABS
0.5000 mg | ORAL_TABLET | Freq: Two times a day (BID) | ORAL | Status: DC | PRN
  Administered 2016-01-25 – 2016-01-26 (×2): 0.5 mg via ORAL
  Filled 2016-01-25 (×2): qty 1

## 2016-01-25 MED ORDER — ASPIRIN 81 MG PO CHEW
81.0000 mg | CHEWABLE_TABLET | ORAL | Status: AC
  Administered 2016-01-26: 81 mg via ORAL
  Filled 2016-01-25: qty 1

## 2016-01-25 MED ORDER — HEPARIN BOLUS VIA INFUSION
4000.0000 [IU] | Freq: Once | INTRAVENOUS | Status: AC
  Administered 2016-01-25: 4000 [IU] via INTRAVENOUS
  Filled 2016-01-25: qty 4000

## 2016-01-25 MED ORDER — NITROGLYCERIN 0.4 MG SL SUBL: 0.4000 mg | SUBLINGUAL_TABLET | SUBLINGUAL | Status: DC | PRN

## 2016-01-25 MED ORDER — FLUOXETINE HCL 10 MG PO CAPS
40.0000 mg | ORAL_CAPSULE | Freq: Every day | ORAL | Status: DC
  Administered 2016-01-26 – 2016-01-27 (×2): 40 mg via ORAL
  Filled 2016-01-25 (×2): qty 4

## 2016-01-25 NOTE — Telephone Encounter (Signed)
Patient c/o chest pain that started this mornin rated 8/10 located in the center of chest, also c/o burning sensation in her throat. Per patient, her bp was elevated @8 :15 am 184/110 &HR 112. Patient said she took her first nitro & her BP went to 161/105 & HR 108 @8 :35 am. @ 8:45 am after second nitro, BP was 154/102 & HR 108. Patient c/o of active chest pain and burning in her throat rated 4/10, feeling hot , sweaty and some nausea. No c/o dizziness or sob. Patient advised to go to the ED now for an evaluation. Patient requested to be seen in the office and nurse advised her that the ED was the best place for her evaluation at this time. Patient verbalized understanding of plan.

## 2016-01-25 NOTE — Telephone Encounter (Signed)
Caller name: Wallis Relationship to patient: self Can be reached: [phone]  Reason for call: pt called with bp concerns and chest tightness. Transferred to [facility] with [facility].

## 2016-01-25 NOTE — ED Triage Notes (Signed)
Pt c/o chest pain started at 0815 this am-- has taken 2 ntg without relief-- has 3 stents.

## 2016-01-25 NOTE — H&P (Signed)
History & Physical    Patient ID: Maria Bishop MRN: [id number], DOB/AGE: 04-26-1960   Admit date: 01/25/2016  Primary Physician: Annye Asa, MD Primary Cardiologist: Dr. Burt Knack   History of Present Illness    Maria Bishop is a 55 y.o. female with past medical history of CAD (s/p NSTEMI in 2013 with spontaneous dissection/plaque rupture of 3rd OM with placement of 3 overlapping DES, patent by cath in 2014), GERD, anxiety, and HLD (on Praluent) who presents to [facility] ED on 01/25/2016 for evaluation of chest pain.   Reports developing sudden onset retrosternal chest pressure while at work this morning. Developed nausea later in the day. No vomiting, dyspnea, or diaphoresis. Walked around with no improvement in her symptoms and chest pain was not increased with exertion. Pain was actually worse with rest and leaning forward. BP at the time of onset was 184/110. She took SL NTG with no improvement in her symptoms. BP improved to 161/105 following 2 SL NTG but pain persisted. Reports her symptoms resemble her initial MI in 2001.  She did have occasional episodes of pain in the past few weeks which occurred at rest and relieved with Xanax or Tylenol. Her symptoms today were significantly worse when compared to these previous episodes.   She reports having 2/10 chest pain at this time. Slightly improved since receiving ASA and being started on Heparin.   Initial labs show a WBC of 6.1, Hgb 14.0, and platelets 244. Creatinine 0.75. Initial troponin negative. CXR with no cardiopulmonary abnormalities. EKG shows sinus tachycardia, HR 107, with slight ST depression in AVF (similar to prior tracings)  Last seen by Dr. Burt Knack in 04/2014 and reported doing well from a cardiac perspective at that time. Has a significant family history of CAD with mother having an MI in her 61's. The patient denies any tobacco or alcohol use.   Past Medical History    Past Medical History:    Diagnosis Date  . CAD (coronary artery disease)    a. NSTEMI 2013: likely spontaneous dissection/ruptured plaque in OM3 - PCI: 3 overlapping Promus DES to OM3, EF 55% b. cath 2014: patent stents  . Cervical cancer ([place])   . Degenerative disc disease   . Myocardial infarction    ~46yrs ago  . NSTEMI (non-ST elevated myocardial infarction) ([place]) 06/24/2011   a. NSTEMI 2013: likely spontaneous dissection/ruptured plaque in OM3 - PCI: 3 overlapping Promus DES to OM3, EF 55%     Past Surgical History:  Procedure Laterality Date  . APPENDECTOMY    . [place] bunionectomy right  February 2015  . LEFT HEART CATHETERIZATION WITH CORONARY ANGIOGRAM N/A 06/24/2011   Procedure: LEFT HEART CATHETERIZATION WITH CORONARY ANGIOGRAM;  Surgeon: Jettie Booze, MD;  Location: [facility] CATH LAB;  Service: Cardiovascular;  Laterality: N/A;  . PERCUTANEOUS CORONARY STENT INTERVENTION (PCI-S) Right 06/24/2011   Procedure: PERCUTANEOUS CORONARY STENT INTERVENTION (PCI-S);  Surgeon: Jettie Booze, MD;  Location: [facility] CATH LAB;  Service: Cardiovascular;  Laterality: Right;  . TUBAL LIGATION       Allergies  Allergies  Allergen Reactions  . Shellfish Allergy   . Statins     Failed Crestor 5 mg qd, Simvastatin 40 mg, and Lipitor 40 mg, and pravastatin 10 mg qd due to muscle and joint pain  . Zetia [Ezetimibe]     Depression and constipation per patient  . Mobic [Meloxicam] Rash     Home Medications    Prior to Admission medications   Medication Sig  Start Date End Date Taking? Authorizing Provider  Alirocumab (PRALUENT) 75 MG/ML SOPN Inject 75 mg into the skin every 14 (fourteen) days. 02/26/15  Yes Sherren Mocha, MD  ALPRAZolam Duanne Moron) 0.5 MG tablet TAKE 1 TABLET BY MOUTH 2 TIMES DAILY AS NEEDED 12/20/15  Yes [name], MD  aspirin 81 MG tablet Take 81 mg by mouth daily.   Yes Historical Provider, MD  fexofenadine (ALLEGRA) 180 MG tablet Take 180 mg by mouth daily. Reported on 06/02/2015   Yes  Historical Provider, MD  FLUoxetine (PROZAC) 40 MG capsule TAKE 1 CAPSULE BY MOUTH EVERY DAY 08/23/15  Yes [name], MD  ibuprofen (ADVIL,MOTRIN) 200 MG tablet Take 800 mg by mouth every 6 (six) hours as needed. For pain   Yes Historical Provider, MD  nitroGLYCERIN (NITROSTAT) 0.4 MG SL tablet Place 1 tablet (0.4 mg total) under the tongue every 5 (five) minutes as needed. 11/26/13  Yes Sherren Mocha, MD  pantoprazole (PROTONIX) 40 MG tablet TAKE 1 TABLET BY MOUTH EVERY DAY 11/23/15  Yes [name], MD    Family History    Family History  Problem Relation Age of Onset  . Alzheimer's disease Maternal Uncle   . Coronary artery disease Mother     MI in her 18s  . Hyperlipidemia Other   . Diabetes Other   . Cancer Other     Social History    Social History   Social History  . Marital status: Single    Spouse name: N/A  . Number of children: 2  . Years of education: N/A   Occupational History  . Customer service manager    Social History Main Topics  . Smoking status: Former Smoker    Types: Cigarettes  . Smokeless tobacco: Never Used  . Alcohol use 0.0 oz/week     Comment: 1-2 times a week  . Drug use: No  . Sexual activity: Not on file   Other Topics Concern  . Not on file   Social History Narrative  . No narrative on file     Review of Systems    General:  No chills, fever, night sweats or weight changes.  Cardiovascular:  No  dyspnea on exertion, edema, orthopnea, palpitations, paroxysmal nocturnal dyspnea. Positive for chest pain.  Dermatological: No rash, lesions/masses Respiratory: No cough, dyspnea Urologic: No hematuria, dysuria Abdominal:   No vomiting, diarrhea, bright red blood per rectum, melena, or hematemesis. Positive for nausea.  Neurologic:  No visual changes, wkns, changes in mental status. All other systems reviewed and are otherwise negative except as noted above.  Physical Exam    Blood pressure 115/87, pulse 80, temperature  98.4 F (36.9 C), temperature source Oral, resp. rate 11, height 5' 1.81" (1.57 m), weight 200 lb (90.7 kg), last menstrual period 07/05/2012, SpO2 95 %.  General: Well developed, well nourished, Caucasian female appearing in no acute distress. Head: Normocephalic, atraumatic, sclera non-icteric, no xanthomas, nares are without discharge. Dentition:  Neck: No carotid bruits. JVD not elevated.  Lungs: Respirations regular and unlabored, without wheezes or rales.  Heart: Regular rate and rhythm. No S3 or S4.  No murmur, no rubs, or gallops appreciated. Abdomen: Soft, non-tender, non-distended with normoactive bowel sounds. No hepatomegaly. No rebound/guarding. No obvious abdominal masses. Msk:  Strength and tone appear normal for age. No joint deformities or effusions. Extremities: No clubbing or cyanosis. No edema.  Distal pedal pulses are 2+ bilaterally. Neuro: Alert and oriented X 3. Moves all extremities spontaneously. No  focal deficits noted. Psych:  Responds to questions appropriately with a normal affect. Skin: No rashes or lesions noted  Labs    Troponin (Point of Care Test)  Recent Labs  01/25/16 1040  TROPIPOC 0.00   No results for input(s): CKTOTAL, CKMB, TROPONINI in the last 72 hours. Lab Results  Component Value Date   WBC 6.1 01/25/2016   HGB 14.0 01/25/2016   HCT 39.8 01/25/2016   MCV 89.6 01/25/2016   PLT 244 01/25/2016     Recent Labs Lab 01/25/16 1142  NA 141  K 3.8  CL 108  CO2 26  BUN 9  CREATININE 0.75  CALCIUM 9.6  GLUCOSE 101*   Lab Results  Component Value Date   CHOL 124 06/02/2015   HDL 41.60 06/02/2015   LDLCALC 63 06/02/2015   TRIG 95.0 06/02/2015    Radiology Studies    Dg Chest 2 View  Result Date: 01/25/2016 CLINICAL DATA:  Hypertension, chest pain. EXAM: CHEST  2 VIEW COMPARISON:  Radiographs of Jun 24, 2011. FINDINGS: The heart size and mediastinal contours are within normal limits. Both lungs are clear. No pneumothorax or  pleural effusion is noted. The visualized skeletal structures are unremarkable. IMPRESSION: No active cardiopulmonary disease. Electronically Signed   By: Marijo Conception, M.D.   On: 01/25/2016 11:14    EKG & Cardiac Imaging    EKG: Sinus tachycardia, HR 107, with slight ST depression in AVF (similar to prior tracings)  ECHOCARDIOGRAM: None on File  Cardiac Catheterization: 04/2012 Procedural Findings: Hemodynamics: AO 107/72 with a mean of 90 LV 108/13  Coronary angiography: Coronary dominance: right  Left mainstem: The left main is short. It is widely patent.  Left anterior descending (LAD): The LAD is patent throughout its course. The distal LAD is small. The proximal mid LAD are widely patent. There is no significant obstructive disease throughout the distribution of the LAD.  Left circumflex (LCx): The left circumflex is large in caliber. The vessel is smooth throughout its course. The area of overlapping stents extending from the mid circumflex into the third OM branch are widely patent. There is very minor irregularity at the proximal edge of the stent but no significant associated stenosis. The distal stented segment has mild diffuse in-stent restenosis estimated at 20-30%.  Right coronary artery (RCA): The RCA is dominant. The vessel is widely patent throughout. This is a moderate caliber vessel with a small diameter PDA and PLA. There are no significant stenoses present.  Left ventriculography: There is a focal area of very mild hypokinesis involving the distal inferior wall. The overall left ventricular ejection fraction is preserved with an estimated ejection fraction of 60%.  Final Conclusions:   1. Continued patency of the stented segment in the left circumflex with no significant obstructive disease noted. 2. Widely patent left main, LAD, and right coronary arteries 3. Mild segmental contraction abnormality left ventricle with preserved overall LV systolic  function  Recommendations: Continued medical management of CAD.    Assessment & Plan    1. Chest Pain Concerning for Unstable Angina/ CAD - has known history of CAD s/p NSTEMI in 2013 with spontaneous dissection/plaque rupture of 3rd OM with placement of 3 overlapping DES, with stents being patent by cath in 2014.   - presents today for evaluation of sudden chest pressure occurring at rest and with exertion. Associated with nausea. Pain not relieved with SL NTG but did improve with ASA and Heparin. Symptoms resemble her initial MI in 2001. -  Initial troponin negative. EKG shows sinus tachycardia, HR 107, with slight ST depression in AVF (similar to prior tracings) - will continue to cycle enzymes.Continue Heparin. Will plan for a repeat cardiac catheterization tomorrow.   2. HLD - Lipid Panel in 05/2015 showed total cholesterol 124, HDL 41, and LDL 63. - on Praluent as an outpatient. Intolerant to statin therapy.   3. GERD - continue PTA Protonix  4. Anxiety - continue home medications.    Signed, Erma Heritage, PA-C 01/25/2016, 3:29 PM Pager: [phone]  Attending Note:   The patient was seen and examined.  Agree with assessment and plan as noted above.  Changes made to the above note as needed.  Patient seen and independently examined with Bernerd Pho, [place] .   We discussed all aspects of the encounter. I agree with the assessment and plan as stated above.  1. Unstable angina: Maria Bishop presents with symptoms that are consistent with unstable angina. 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 She's had stenting of her obtuse marginal artery. She now presents with symptoms that are identical to her presenting symptoms several years ago.  Her initial cardiac enzymes are negative. We'll continue to get enzymes and watch for any evidence of an acute cornea syndrome. Currently her symptoms are better and I think that she can wait for  tomorrow to have a cath.  I would recommend that we proceed with cardiac cath tomorrow.  2. Hyperlipidemia: She's currently on Pralulent     I have spent a total of 40 minutes with patient reviewing hospital  notes , telemetry, EKGs, labs and examining patient as well as establishing an assessment and plan that was discussed with the patient. > 50% of time was spent in direct patient care.    Thayer Headings, Brooke Bonito., MD, [facility] 01/25/2016, 4:28 PM [address]  [place] Pager [phone]

## 2016-01-25 NOTE — Telephone Encounter (Signed)
Patient Name: JAIMIE GUERINO DOB: 07/14/1960 Initial Comment Caller states having BP concerns and chest tightness Nurse Assessment Nurse: Marcelline Deist, RN, Lynda Date/Time (Eastern Time): 01/25/2016 9:03:56 AM Confirm and document reason for call. If symptomatic, describe symptoms. ---Caller states having BP concerns and centralized chest tightness this am. Started around 8:30 am, still present. Caller cutting in & out as she is driving. Does the patient have any new or worsening symptoms? ---Yes Will a triage be completed? ---Yes Related visit to physician within the last 2 weeks? ---N/A Does the PT have any chronic conditions? (i.e. diabetes, asthma, etc.) ---Unknown Is the patient pregnant or possibly pregnant? (Ask all females between the ages of 29-55) ---No Is this a behavioral health or substance abuse call? ---No Guidelines Guideline Title Affirmed Question Affirmed Notes Final Disposition User Clinical Call Marcelline Deist, RN, Kermit Balo Comments Nurse attempted to reach caller three times since call was disconnected. She stated she was traveling and may lose connection. Unable to re-connect with caller.

## 2016-01-25 NOTE — ED Notes (Signed)
Pt ambulatory to restroom

## 2016-01-25 NOTE — Telephone Encounter (Signed)
New message  1. Chest pain now 2. Sweating, burning sensation in throat 3. Just started this morning 4. Continuous  5. Has taken two nitro

## 2016-01-25 NOTE — Telephone Encounter (Signed)
Follow up call placed to patient. Patient states he is currently at ED for eva;luation.

## 2016-01-25 NOTE — ED Provider Notes (Signed)
[facility] DEPT Provider Note   CSN: [id number] Arrival date & time: 01/25/16  [id number]     History   Chief Complaint Chief Complaint  Patient presents with  . Chest Pain    HPI Maria Bishop is a 55 y.o. female who presents with chest pain. PMH significant for CAD s/p stenting of LCx (2013), GERD, depression/anxiety, dyslipidemia. She has had multiple MIs. She states that she went to work this morning. She took her medicines and 30 minutes later developed severe, central/left sided chest pain which radiated to her throat. Pain started around 8:30 AM. She developed a burning sensation and started coughing. She felt like she was going to vomit from coughing but denies nausea. She felt hot and had to go outside in the cool air so she could breath better but denies SOB. She took her BP and was noted to be hypertensive with SBP in the 180s. She took a nitro which did not relieve her pain. She took another nitro which still did not relieve her pain. Also took 81mg  ASA. She works in [place] but lives in [place] and so had her daughter drive her here. The pain is currently 2/10. She states it is better with lying down, worse with exertion and sitting up. She has occaisional anginal chest pain however this was different from that since it was more severe and lasted longer. Dr. Burt Knack is her cardiologist. Last cath was in 2014 which showed patent stent. No echo on file however 2014 cath showed mild segmental contraction abnormality of left ventricle with preserved overall LV systolic function.  HPI  Past Medical History:  Diagnosis Date  . CAD (coronary artery disease)   . Cervical cancer ([place])   . Degenerative disc disease   . Myocardial infarction    ~63yrs ago  . NSTEMI (non-ST elevated myocardial infarction) ([place]) 06/24/2011   likely spontaneous dissection/ruptured plaque in OM3 - PCI: 3 overlapping Promus DES to OM3, EF 55%    Patient Active Problem List   Diagnosis Date Noted  .  Lumbar radiculopathy 08/06/2015  . Memory loss 12/02/2014  . History of cervical cancer 12/02/2014  . History of MI (myocardial infarction) 12/02/2014  . Depression 12/02/2014  . Acute bacterial sinusitis 10/29/2014  . Insomnia 08/31/2014  . Esophageal dysphagia 07/28/2013  . MRSA (methicillin resistant Staphylococcus aureus) 07/08/2013  . GERD (gastroesophageal reflux disease) 07/08/2013  . Abdominal pain, epigastric 07/08/2013  . Screening for malignant neoplasm of the cervix 07/19/2012  . Routine general medical examination at a health care facility 07/19/2012  . Depression with anxiety 06/06/2012  . Hyperlipidemia 09/06/2011  . Degenerative disc disease 06/25/2011  . Cervical cancer ([place]) 06/25/2011  . NSTEMI (non-ST elevated myocardial infarction) ([place]) 06/24/2011  . CAD (coronary artery disease) 06/24/2001    Past Surgical History:  Procedure Laterality Date  . APPENDECTOMY    . [place] bunionectomy right  February 2015  . LEFT HEART CATHETERIZATION WITH CORONARY ANGIOGRAM N/A 06/24/2011   Procedure: LEFT HEART CATHETERIZATION WITH CORONARY ANGIOGRAM;  Surgeon: Jettie Booze, MD;  Location: [facility] CATH LAB;  Service: Cardiovascular;  Laterality: N/A;  . PERCUTANEOUS CORONARY STENT INTERVENTION (PCI-S) Right 06/24/2011   Procedure: PERCUTANEOUS CORONARY STENT INTERVENTION (PCI-S);  Surgeon: Jettie Booze, MD;  Location: [facility] CATH LAB;  Service: Cardiovascular;  Laterality: Right;  . TUBAL LIGATION      OB History    No data available       Home Medications    Prior to Admission medications  Medication Sig Start Date End Date Taking? Authorizing Provider  Alirocumab (PRALUENT) 75 MG/ML SOPN Inject 75 mg into the skin every 14 (fourteen) days. 02/26/15   Sherren Mocha, MD  ALPRAZolam Duanne Moron) 0.5 MG tablet TAKE 1 TABLET BY MOUTH 2 TIMES DAILY AS NEEDED 12/20/15   [name], MD  aspirin 81 MG tablet Take 81 mg by mouth daily.    Historical Provider, MD    diclofenac (VOLTAREN) 75 MG EC tablet TAKE 1 TABLET BY MOUTH 2 TIMES DAILY WITH FOOD 10/04/15   Historical Provider, MD  fexofenadine (ALLEGRA) 180 MG tablet Take 180 mg by mouth daily. Reported on 06/02/2015    Historical Provider, MD  FLUoxetine (PROZAC) 40 MG capsule TAKE 1 CAPSULE BY MOUTH EVERY DAY 08/23/15   [name], MD  ibuprofen (ADVIL,MOTRIN) 200 MG tablet Take 800 mg by mouth every 6 (six) hours as needed. For pain    Historical Provider, MD  methocarbamol (ROBAXIN) 500 MG tablet TAKE 1 TABLET BY MOUTH EVERY 6 TO 8 HOURS AS NEEDED FOR PAIN OR SPASM 10/04/15   Historical Provider, MD  nitroGLYCERIN (NITROSTAT) 0.4 MG SL tablet Place 1 tablet (0.4 mg total) under the tongue every 5 (five) minutes as needed. 11/26/13   Sherren Mocha, MD  pantoprazole (PROTONIX) 40 MG tablet TAKE 1 TABLET BY MOUTH EVERY DAY 11/23/15   [name], MD  traMADol (ULTRAM) 50 MG tablet Take 1 tablet (50 mg total) by mouth every 8 (eight) hours as needed. 09/29/15   [name], MD    Family History Family History  Problem Relation Age of Onset  . Alzheimer's disease Maternal Uncle   . Coronary artery disease Mother     MI in her 82s  . Hyperlipidemia Other   . Diabetes Other   . Cancer Other     Social History Social History  Substance Use Topics  . Smoking status: Former Smoker    Types: Cigarettes  . Smokeless tobacco: Never Used  . Alcohol use 0.0 oz/week     Comment: 1-2 times a week     Allergies   Shellfish allergy; Statins; Zetia [ezetimibe]; and Mobic [meloxicam]   Review of Systems Review of Systems  Constitutional: Negative for chills, diaphoresis and fever.  HENT:       Burning in throat  Respiratory: Positive for cough. Negative for shortness of breath and wheezing.   Cardiovascular: Positive for chest pain. Negative for palpitations and leg swelling.  Gastrointestinal: Negative for abdominal pain, nausea and vomiting.  Neurological: Negative for  light-headedness and numbness.  All other systems reviewed and are negative.    Physical Exam Updated Vital Signs BP 115/84   Pulse 78   Temp 98.4 F (36.9 C) (Oral)   Resp (!) 9   LMP 07/05/2012   SpO2 98%   Physical Exam  Constitutional: She is oriented to person, place, and time. She appears well-developed and well-nourished. No distress.  HENT:  Head: Normocephalic and atraumatic.  Eyes: Conjunctivae are normal. Pupils are equal, round, and reactive to light. Right eye exhibits no discharge. Left eye exhibits no discharge. No scleral icterus.  Neck: Normal range of motion.  Cardiovascular: Normal rate and regular rhythm.  Exam reveals no gallop and no friction rub.   No murmur heard. Pulmonary/Chest: Effort normal and breath sounds normal. No respiratory distress. She has no wheezes. She has no rales. She exhibits tenderness.  Mild left sided chest tenderness  Abdominal: Soft. Bowel sounds are normal. She  exhibits no distension and no mass. There is no tenderness. There is no rebound and no guarding. No hernia.  Neurological: She is alert and oriented to person, place, and time.  Skin: Skin is warm and dry.  Psychiatric: She has a normal mood and affect. Her behavior is normal.  Nursing note and vitals reviewed.    ED Treatments / Results  Labs (all labs ordered are listed, but only abnormal results are displayed) Labs Reviewed  BASIC METABOLIC PANEL - Abnormal; Notable for the following:       Result Value   Glucose, Bld 101 (*)    All other components within normal limits  BASIC METABOLIC PANEL - Abnormal; Notable for the following:    Potassium 3.4 (*)    All other components within normal limits  CBC - Abnormal; Notable for the following:    WBC 3.8 (*)    All other components within normal limits  CBC  HEPARIN LEVEL (UNFRACTIONATED)  TROPONIN I  CBC  HEPARIN LEVEL (UNFRACTIONATED)  PROTIME-INR  CREATININE, SERUM  I-STAT TROPOININ, ED    EKG  EKG  Interpretation  Date/Time:  Tuesday January 25 2016 10:00:14 EST Ventricular Rate:  107 PR Interval:  140 QRS Duration: 82 QT Interval:  326 QTC Calculation: 435 R Axis:   25 Text Interpretation:  Sinus tachycardia Low voltage QRS Cannot rule out Anterior infarct , age undetermined Abnormal ECG No acute changes Confirmed by Kathrynn Humble, MD, Thelma Comp [id number]) on 01/25/2016 10:34:52 AM       Radiology No results found.  Procedures Procedures (including critical care time)  Medications Ordered in ED Medications  0.9% sodium chloride infusion (3 mL/kg/hr  90.7 kg Intravenous New Bag/Given 01/26/16 0639)  0.9 %  sodium chloride infusion (not administered)  aspirin chewable tablet 324 mg (324 mg Oral Given 01/25/16 1257)  heparin bolus via infusion 4,000 Units (4,000 Units Intravenous Bolus from Bag 01/25/16 1323)  aspirin chewable tablet 81 mg (81 mg Oral Given 01/26/16 0643)  potassium chloride SA (K-DUR,KLOR-CON) CR tablet 40 mEq (40 mEq Oral Given 01/26/16 0903)     Initial Impression / Assessment and Plan / ED Course  I have reviewed the triage vital signs and the nursing notes.  Pertinent labs & imaging results that were available during my care of the patient were reviewed by me and considered in my medical decision making (see chart for details).  Clinical Course    55 year old with symptoms concerning for unstable angina. She is hypertensive in the ED with mild tachycardia. Otherwise vitals are WNL. EKG shows sinus tachycardia without any acute changes. Initial troponin is 0. CXR negative.  Labs are unremarkable.  Due to concerning history and constant chest pain with no relief from nitro, will consult cardiology and start heparin. 324mg  ASA given as well.   Cardiology agrees to admit for possible cath. Appreciate assistance.  Final Clinical Impressions(s) / ED Diagnoses   Final diagnoses:  Chest pain, unspecified type    New Prescriptions New Prescriptions   No  medications on file     Recardo Evangelist, PA-C 01/28/16 [place], MD 02/04/16 [phone]

## 2016-01-25 NOTE — Progress Notes (Signed)
[facility] for heparin Indication: chest pain/ACS  Allergies  Allergen Reactions  . Shellfish Allergy   . Statins     Failed Crestor 5 mg qd, Simvastatin 40 mg, and Lipitor 40 mg, and pravastatin 10 mg qd due to muscle and joint pain  . Zetia [Ezetimibe]     Depression and constipation per patient  . Mobic [Meloxicam] Rash    Patient Measurements: Height: 5\' 2"  (157.5 cm) Weight: 204 lb 9.4 oz (92.8 kg) IBW/kg (Calculated) : 50.1 Heparin Dosing Weight: 70.7 kg  Vital Signs: Temp: 98.4 F (36.9 C) (12/19 1107) Temp Source: Oral (12/19 1725) BP: 134/88 (12/19 1800) Pulse Rate: 91 (12/19 1725)  Labs:  Recent Labs  01/25/16 1142 01/25/16 1524 01/25/16 1729  HGB 14.0  --   --   HCT 39.8  --   --   PLT 244  --   --   HEPARINUNFRC  --   --  0.44  CREATININE 0.75  --   --   TROPONINI  --  <0.03  --     Estimated Creatinine Clearance: 86.3 mL/min (by C-G formula based on SCr of 0.75 mg/dL).   Medical History: Past Medical History:  Diagnosis Date  . CAD (coronary artery disease)    a. NSTEMI 2013: likely spontaneous dissection/ruptured plaque in OM3 - PCI: 3 overlapping Promus DES to OM3, EF 55% b. cath 2014: patent stents  . Cervical cancer ([place])   . Degenerative disc disease   . Myocardial infarction    ~102yrs ago  . NSTEMI (non-ST elevated myocardial infarction) ([place]) 06/24/2011   a. NSTEMI 2013: likely spontaneous dissection/ruptured plaque in OM3 - PCI: 3 overlapping Promus DES to OM3, EF 55%     Assessment: 55 yo female with h/o NSTEMIs, CAD with h/o stents, HLD presenting with chest pain. Initial heparin level is therapeutic at 0.44.  Goal of Therapy:  Heparin level 0.3-0.7 units/ml Monitor platelets by anticoagulation protocol: Yes   Plan:  1. Continue heparin infusion at 850 units/hr 2. Daily heparin level and CBC 3. Noted plans for cardiac cath in am   Vincenza Hews, PharmD, BCPS 01/25/2016, 7:41 PM Pager:  [phone]

## 2016-01-25 NOTE — Progress Notes (Addendum)
ANTICOAGULATION CONSULT NOTE - Initial Consult  Pharmacy Consult for heparin Indication: chest pain/ACS  Allergies  Allergen Reactions  . Shellfish Allergy   . Statins     Failed Crestor 5 mg qd, Simvastatin 40 mg, and Lipitor 40 mg, and pravastatin 10 mg qd due to muscle and joint pain  . Zetia [Ezetimibe]     Depression and constipation per patient  . Mobic [Meloxicam] Rash    Patient Measurements: Height: 5' 1.81" (157 cm) Weight: 200 lb (90.7 kg) IBW/kg (Calculated) : 49.67 Heparin Dosing Weight: 70.7 kg  Vital Signs: Temp: 98.4 F (36.9 C) (12/19 1107) Temp Source: Oral (12/19 1107) BP: 127/89 (12/19 1215) Pulse Rate: 91 (12/19 1215)  Labs:  Recent Labs  01/25/16 1142  HGB 14.0  HCT 39.8  PLT 244  CREATININE 0.75    Estimated Creatinine Clearance: 84.9 mL/min (by C-G formula based on SCr of 0.75 mg/dL).   Medical History: Past Medical History:  Diagnosis Date  . CAD (coronary artery disease)   . Cervical cancer ([place])   . Degenerative disc disease   . Myocardial infarction    ~54yrs ago  . NSTEMI (non-ST elevated myocardial infarction) ([place]) 06/24/2011   likely spontaneous dissection/ruptured plaque in OM3 - PCI: 3 overlapping Promus DES to OM3, EF 55%    Assessment: 55 yo female with h/o NSTEMIs, CAD with h/o stents, HLD presenting with chest pain. On ASA 81 mg PTA, no other anticoagulation. Hgb 14.0, PLTC 244.   Goal of Therapy:  Heparin level 0.3-0.7 units/ml Monitor platelets by anticoagulation protocol: Yes   Plan:  Give 4000 units bolus x 1 Start heparin infusion at 850 units/hr Check anti-Xa level in 6 hours and daily while on heparin Continue to monitor H&H and platelets 1700 Heparin level   Carlean Jews, Pharm.D. PGY1 Pharmacy Resident 12/19/20172:05 PM Pager [phone]

## 2016-01-26 ENCOUNTER — Encounter (HOSPITAL_COMMUNITY)
Admission: EM | Disposition: A | Discharge status: Home / Self Care | Payer: Self-pay | Source: Home / Self Care | Attending: Emergency Medicine

## 2016-01-26 ENCOUNTER — Observation Stay (HOSPITAL_COMMUNITY)
Admission: RE | Admit: 2016-01-26 | Payer: BLUE CROSS/BLUE SHIELD | Source: Ambulatory Visit | Admitting: Interventional Cardiology

## 2016-01-26 DIAGNOSIS — E785 Hyperlipidemia, unspecified: Secondary | ICD-10-CM | POA: Diagnosis not present

## 2016-01-26 DIAGNOSIS — R000 Tachycardia, unspecified: Secondary | ICD-10-CM | POA: Diagnosis not present

## 2016-01-26 DIAGNOSIS — I2542 Coronary artery dissection: Secondary | ICD-10-CM | POA: Diagnosis not present

## 2016-01-26 DIAGNOSIS — I200 Unstable angina: Secondary | ICD-10-CM | POA: Diagnosis not present

## 2016-01-26 DIAGNOSIS — I2510 Atherosclerotic heart disease of native coronary artery without angina pectoris: Secondary | ICD-10-CM | POA: Diagnosis not present

## 2016-01-26 DIAGNOSIS — R079 Chest pain, unspecified: Secondary | ICD-10-CM | POA: Diagnosis not present

## 2016-01-26 DIAGNOSIS — I10 Essential (primary) hypertension: Secondary | ICD-10-CM | POA: Diagnosis not present

## 2016-01-26 HISTORY — PX: CARDIAC CATHETERIZATION: SHX172

## 2016-01-26 LAB — CREATININE, SERUM
Creatinine, Ser: 0.78 mg/dL (ref 0.44–1.00)
GFR calc Af Amer: >60 mL/min (ref >60)
GFR calc non Af Amer: >60 mL/min (ref >60)

## 2016-01-26 LAB — CBC
HCT: 38.7 % (ref 36.0–46.0)
HCT: 36.2 % (ref 36.0–46.0)
Hemoglobin: 13.3 g/dL (ref 12.0–15.0)
Hemoglobin: 12.7 g/dL (ref 12.0–15.0)
MCH: 30.9 pg (ref 26.0–34.0)
MCH: 31.9 pg (ref 26.0–34.0)
MCHC: 34.4 g/dL (ref 30.0–36.0)
MCHC: 35.1 g/dL (ref 30.0–36.0)
MCV: 90 fL (ref 78.0–100.0)
MCV: 91 fL (ref 78.0–100.0)
Platelets: 230 10*3/uL (ref 150–400)
Platelets: 213 10*3/uL (ref 150–400)
RBC: 4.3 MIL/uL (ref 3.87–5.11)
RBC: 3.98 MIL/uL (ref 3.87–5.11)
RDW: 13.5 % (ref 11.5–15.5)
RDW: 13.4 % (ref 11.5–15.5)
WBC: 4.1 10*3/uL (ref 4.0–10.5)
WBC: 3.8 10*3/uL — ABNORMAL LOW (ref 4.0–10.5)

## 2016-01-26 LAB — BASIC METABOLIC PANEL
Anion gap: 8 (ref 5–15)
BUN: 10 mg/dL (ref 6–20)
CO2: 25 mmol/L (ref 22–32)
Calcium: 9 mg/dL (ref 8.9–10.3)
Chloride: 107 mmol/L (ref 101–111)
Creatinine, Ser: 0.74 mg/dL (ref 0.44–1.00)
GFR calc Af Amer: >60 mL/min (ref >60)
GFR calc non Af Amer: >60 mL/min (ref >60)
Glucose, Bld: 94 mg/dL (ref 65–99)
Potassium: 3.4 mmol/L — ABNORMAL LOW (ref 3.5–5.1)
Sodium: 140 mmol/L (ref 135–145)

## 2016-01-26 LAB — PROTIME-INR
INR: 1.01
Prothrombin Time: 13.3 seconds (ref 11.4–15.2)

## 2016-01-26 LAB — HEPARIN LEVEL (UNFRACTIONATED): Heparin Unfractionated: 0.32 IU/mL (ref 0.30–0.70)

## 2016-01-26 SURGERY — LEFT HEART CATH AND CORONARY ANGIOGRAPHY
Anesthesia: LOCAL

## 2016-01-26 MED ORDER — MIDAZOLAM HCL 2 MG/2ML IJ SOLN
INTRAMUSCULAR | Status: DC | PRN
  Administered 2016-01-26 (×2): 1 mg via INTRAVENOUS

## 2016-01-26 MED ORDER — SODIUM CHLORIDE 0.9 % IV SOLN: 250.0000 mL | INTRAVENOUS | Status: DC | PRN

## 2016-01-26 MED ORDER — HEPARIN (PORCINE) IN NACL 2-0.9 UNIT/ML-% IJ SOLN
INTRAMUSCULAR | Status: DC | PRN
  Administered 2016-01-26: 1500 mL

## 2016-01-26 MED ORDER — OXYCODONE-ACETAMINOPHEN 5-325 MG PO TABS: 1.0000 | ORAL_TABLET | ORAL | Status: DC | PRN

## 2016-01-26 MED ORDER — LIDOCAINE HCL (PF) 1 % IJ SOLN
INTRAMUSCULAR | Status: AC
  Filled 2016-01-26: qty 30

## 2016-01-26 MED ORDER — HEPARIN (PORCINE) IN NACL 2-0.9 UNIT/ML-% IJ SOLN
INTRAMUSCULAR | Status: AC
  Filled 2016-01-26: qty 1000

## 2016-01-26 MED ORDER — IOPAMIDOL (ISOVUE-370) INJECTION 76%
INTRAVENOUS | Status: AC
  Filled 2016-01-26: qty 100

## 2016-01-26 MED ORDER — FENTANYL CITRATE (PF) 100 MCG/2ML IJ SOLN
INTRAMUSCULAR | Status: AC
  Filled 2016-01-26: qty 2

## 2016-01-26 MED ORDER — SODIUM CHLORIDE 0.9% FLUSH: 3.0000 mL | Freq: Two times a day (BID) | INTRAVENOUS | Status: DC

## 2016-01-26 MED ORDER — SODIUM CHLORIDE 0.9 % IV SOLN: INTRAVENOUS | Status: AC

## 2016-01-26 MED ORDER — ONDANSETRON HCL 4 MG/2ML IJ SOLN: 4.0000 mg | Freq: Four times a day (QID) | INTRAMUSCULAR | Status: DC | PRN

## 2016-01-26 MED ORDER — POTASSIUM CHLORIDE CRYS ER 20 MEQ PO TBCR
40.0000 meq | EXTENDED_RELEASE_TABLET | Freq: Once | ORAL | Status: AC
  Administered 2016-01-26: 40 meq via ORAL
  Filled 2016-01-26: qty 2

## 2016-01-26 MED ORDER — MIDAZOLAM HCL 2 MG/2ML IJ SOLN
INTRAMUSCULAR | Status: AC
  Filled 2016-01-26: qty 2

## 2016-01-26 MED ORDER — HEPARIN SODIUM (PORCINE) 1000 UNIT/ML IJ SOLN
INTRAMUSCULAR | Status: DC | PRN
  Administered 2016-01-26: 4500 [IU] via INTRAVENOUS

## 2016-01-26 MED ORDER — HEPARIN SODIUM (PORCINE) 5000 UNIT/ML IJ SOLN: 5000.0000 [IU] | Freq: Three times a day (TID) | INTRAMUSCULAR | Status: DC

## 2016-01-26 MED ORDER — VERAPAMIL HCL 2.5 MG/ML IV SOLN
INTRAVENOUS | Status: AC
  Filled 2016-01-26: qty 2

## 2016-01-26 MED ORDER — HEPARIN SODIUM (PORCINE) 1000 UNIT/ML IJ SOLN
INTRAMUSCULAR | Status: AC
  Filled 2016-01-26: qty 1

## 2016-01-26 MED ORDER — FENTANYL CITRATE (PF) 100 MCG/2ML IJ SOLN
INTRAMUSCULAR | Status: DC | PRN
  Administered 2016-01-26: 50 ug via INTRAVENOUS

## 2016-01-26 MED ORDER — LIDOCAINE HCL (PF) 1 % IJ SOLN
INTRAMUSCULAR | Status: DC | PRN
  Administered 2016-01-26: 2 mL

## 2016-01-26 MED ORDER — ACETAMINOPHEN 325 MG PO TABS: 650.0000 mg | ORAL_TABLET | ORAL | Status: DC | PRN

## 2016-01-26 MED ORDER — IOPAMIDOL (ISOVUE-370) INJECTION 76%
INTRAVENOUS | Status: DC | PRN
  Administered 2016-01-26: 90 mL via INTRA_ARTERIAL

## 2016-01-26 MED ORDER — SODIUM CHLORIDE 0.9% FLUSH: 3.0000 mL | INTRAVENOUS | Status: DC | PRN

## 2016-01-26 MED ORDER — VERAPAMIL HCL 2.5 MG/ML IV SOLN
INTRAVENOUS | Status: DC | PRN
  Administered 2016-01-26: 10 mL via INTRA_ARTERIAL

## 2016-01-26 SURGICAL SUPPLY — 9 items

## 2016-01-26 NOTE — Interval H&P Note (Signed)
Cath Lab Visit (complete for each Cath Lab visit)  Clinical Evaluation Leading to the Procedure:   ACS: Yes.    Non-ACS:    Anginal Classification: CCS Bishop  Anti-ischemic medical therapy: Minimal Therapy (1 class of medications)  Non-Invasive Test Results: No non-invasive testing performed  Prior CABG: No previous CABG      History and Physical Interval Note:  01/26/2016 5:22 PM  Maria Bishop  has presented today for surgery, with the diagnosis of cp  The various methods of treatment have been discussed with the patient and family. After consideration of risks, benefits and other options for treatment, the patient has consented to  Procedure(s): Left Heart Cath and Coronary Angiography (N/A) as a surgical intervention .  The patient's history has been reviewed, patient examined, no change in status, stable for surgery.  I have reviewed the patient's chart and labs.  Questions were answered to the patient's satisfaction.     Maria Bishop

## 2016-01-26 NOTE — H&P (View-Only) (Signed)
Patient Name: [name] Date of Encounter: 01/26/2016  Primary Cardiologist: Dr. Tyrell Antonio Problem List     Active Problems:   Chest pain    Patient Profile     Ashtin Grisez is a 55 y.o. female with past medical history of CAD (s/p NSTEMI in 2013 with spontaneous dissection/plaque rupture of 3rd OM with placement of 3 overlapping DES, patent by cath in 2014), GERD, anxiety, and HLD (on Praluent) who presents to [facility] ED on 01/25/2016 for evaluation of chest pain. Plan is for LHC this admission.   Subjective   Feels fine currently. She had some chest discomfort when trying to eat earlier. Noted difficulty swallowing. Currently CP free. No dyspnea.   Inpatient Medications    Scheduled Meds: . aspirin  81 mg Oral Daily  . FLUoxetine  40 mg Oral Daily  . pantoprazole  40 mg Oral Daily   Continuous Infusions: . sodium chloride 1 mL/kg/hr (01/26/16 0700)  . heparin 850 Units/hr (01/26/16 [id number])   PRN Meds: acetaminophen, ALPRAZolam, nitroGLYCERIN, ondansetron (ZOFRAN) IV   Vital Signs    Vitals:   01/25/16 2020 01/25/16 2021 01/25/16 2142 01/26/16 0533  BP:   108/71 129/74  Pulse:   81 87  Resp: 12 12 15 16   Temp:   98.1 F (36.7 C) 98.1 F (36.7 C)  TempSrc:   Oral Oral  SpO2:   97% 98%  Weight:    203 lb 12.8 oz (92.4 kg)  Height:        Intake/Output Summary (Last 24 hours) at 01/26/16 0903 Last data filed at 01/26/16 [id number]  Gross per 24 hour  Intake           482.63 ml  Output                0 ml  Net           482.63 ml   Filed Weights   01/25/16 1215 01/25/16 1725 01/26/16 0533  Weight: 200 lb (90.7 kg) 204 lb 9.4 oz (92.8 kg) 203 lb 12.8 oz (92.4 kg)    Physical Exam   GEN: Well nourished, well developed, in no acute distress.  HEENT: Grossly normal.  Neck: Supple, no JVD, carotid bruits, or masses. Cardiac: RRR, no murmurs, rubs, or gallops. No clubbing, cyanosis, edema.  Radials/DP/PT 2+ and equal bilaterally.    Respiratory:  Respirations regular and unlabored, clear to auscultation bilaterally. GI: Soft, nontender, nondistended, BS + x 4. MS: no deformity or atrophy. Skin: warm and dry, no rash. Neuro:  Strength and sensation are intact. Psych: AAOx3.  Normal affect.  Labs    CBC  Recent Labs  01/25/16 1142 01/26/16 0331  WBC 6.1 4.1  HGB 14.0 13.3  HCT 39.8 38.7  MCV 89.6 90.0  PLT 244 [id number]   Basic Metabolic Panel  Recent Labs  01/25/16 1142 01/26/16 0331  NA 141 140  K 3.8 3.4*  CL 108 107  CO2 26 25  GLUCOSE 101* 94  BUN 9 10  CREATININE 0.75 0.74  CALCIUM 9.6 9.0   Liver Function Tests No results for input(s): AST, ALT, ALKPHOS, BILITOT, PROT, ALBUMIN in the last 72 hours. No results for input(s): LIPASE, AMYLASE in the last 72 hours. Cardiac Enzymes  Recent Labs  01/25/16 1524  TROPONINI <0.03   BNP Invalid input(s): POCBNP D-Dimer No results for input(s): DDIMER in the last 72 hours. Hemoglobin A1C No results for input(s): HGBA1C in the last 72 hours.  Fasting Lipid Panel No results for input(s): CHOL, HDL, LDLCALC, TRIG, CHOLHDL, LDLDIRECT in the last 72 hours. Thyroid Function Tests No results for input(s): TSH, T4TOTAL, T3FREE, THYROIDAB in the last 72 hours.  Invalid input(s): FREET3  Telemetry    NSR - Personally Reviewed   Radiology    Dg Chest 2 View  Result Date: 01/25/2016 CLINICAL DATA:  Hypertension, chest pain. EXAM: CHEST  2 VIEW COMPARISON:  Radiographs of Jun 24, 2011. FINDINGS: The heart size and mediastinal contours are within normal limits. Both lungs are clear. No pneumothorax or pleural effusion is noted. The visualized skeletal structures are unremarkable. IMPRESSION: No active cardiopulmonary disease. Electronically Signed   By: Marijo Conception, M.D.   On: 01/25/2016 11:14    Cardiac Studies   Cardiac Panel (last 3 results)  Recent Labs  01/25/16 1524  TROPONINI <0.03     Patient Profile     Shantea Ludovico is  a 55 y.o. female with past medical history of CAD (s/p NSTEMI in 2013 with spontaneous dissection/plaque rupture of 3rd OM with placement of 3 overlapping DES, patent by cath in 2014), GERD, anxiety, and HLD (on Praluent) who presents to [facility] ED on 01/25/2016 for evaluation of chest pain.   Assessment & Plan    1. Chest Pain Concerning for Unstable Angina/ CAD - has known history of CAD s/p NSTEMI in 2013 with spontaneous dissection/plaque rupture of 3rd OM with placement of 3 overlapping DES, with stents being patent by cath in 2014.   -Recent symptoms resemble her initial MI, although she also has features that are consistent with possible GI etiology (difficulty swallowing food and chest discomfort while eating) -  Initial troponin negative. Serial enzymes not cycled, unfortunately. Continue Heparin. Will plan for a repeat cardiac catheterization today. If no significant coronary findings on cath, consider adjustments of antacids/ outpatient GI referral.   2. HLD - Lipid Panel in 05/2015 showed total cholesterol 124, HDL 41, and LDL 63. - on Praluent as an outpatient. Intolerant to statin therapy. Lipid panel not ordered today.   3. GERD - continue PTA Protonix  4. Anxiety - continue home medications.   5. Hypokalemia:  - K was 3.4 this am. 40 mEq of KDur ordered.   Signed, Lyda Jester, PA-C  01/26/2016, 9:03 AM   Attending Note:   The patient was seen and examined.  Agree with assessment and plan as noted above.  Changes made to the above note as needed.  Patient seen and independently examined with Ellen Henri, PA .   We discussed all aspects of the encounter. I agree with the assessment and plan as stated above.  1. CAD - hx of SCAD . Now presents with more CP - similar to her presenting episodes  troponins are negative .   Were apparently negative when she presented for her original SCAD episode.   Discussed the risks, benefits, options. She  understands and agrees to proceed.    I have spent a total of 20 minutes with patient reviewing hospital  notes , telemetry, EKGs, labs and examining patient as well as establishing an assessment and plan that was discussed with the patient. > 50% of time was spent in direct patient care.    Thayer Headings, Brooke Bonito., MD, [facility] 01/26/2016, 11:05 AM [address],  [place] Pager [phone]

## 2016-01-26 NOTE — Progress Notes (Signed)
ANTICOAGULATION CONSULT NOTE - Follow Up Consult  Pharmacy Consult for Heparin Indication: chest pain/ACS  Allergies  Allergen Reactions  . Shellfish Allergy   . Statins     Failed Crestor 5 mg qd, Simvastatin 40 mg, and Lipitor 40 mg, and pravastatin 10 mg qd due to muscle and joint pain  . Zetia [Ezetimibe]     Depression and constipation per patient  . Mobic [Meloxicam] Rash    Patient Measurements: Height: 5\' 2"  (157.5 cm) Weight: 203 lb 12.8 oz (92.4 kg) IBW/kg (Calculated) : 50.1 Heparin Dosing Weight: 71 kg  Vital Signs: Temp: 98.1 F (36.7 C) (12/20 0533) Temp Source: Oral (12/20 0533) BP: 129/74 (12/20 0533) Pulse Rate: 87 (12/20 0533)  Labs:  Recent Labs  01/25/16 1142 01/25/16 1524 01/25/16 1729 01/26/16 0331  HGB 14.0  --   --  13.3  HCT 39.8  --   --  38.7  PLT 244  --   --  230  LABPROT  --   --   --  13.3  INR  --   --   --  1.01  HEPARINUNFRC  --   --  0.44 0.32  CREATININE 0.75  --   --  0.74  TROPONINI  --  <0.03  --   --     Estimated Creatinine Clearance: 86 mL/min (by C-G formula based on SCr of 0.74 mg/dL).  Assessment:  55 yo female with h/o NSTEMIs, CAD with h/o stents, HLD presented 01/25/16 with chest pain.     Heparin level has trended down a bit but remains therapeutic (0.32) on 850 units/hr.   Troponins negative, noted pain free.  For cardiac cath later today.    Goal of Therapy:  Heparin level 0.3-0.7 units/ml Monitor platelets by anticoagulation protocol: Yes   Plan:   Continue heparin drip at 850 units/hr.  Daily heparin level and CBC while on heparin.  Follow up post-cath.  Arty Baumgartner, [place] Pager: [phone] 01/26/2016,10:10 AM

## 2016-01-26 NOTE — Progress Notes (Signed)
Patient Name: Maria Bishop Date of Encounter: 01/26/2016  Primary Cardiologist: Dr. Tyrell Antonio Problem List     Active Problems:   Chest pain    Patient Profile     Maria Bishop is a 55 y.o. female with past medical history of CAD (s/p NSTEMI in 2013 with spontaneous dissection/plaque rupture of 3rd OM with placement of 3 overlapping DES, patent by cath in 2014), GERD, anxiety, and HLD (on Praluent) who presents to [facility] ED on 01/25/2016 for evaluation of chest pain. Plan is for LHC this admission.   Subjective   Feels fine currently. She had some chest discomfort when trying to eat earlier. Noted difficulty swallowing. Currently CP free. No dyspnea.   Inpatient Medications    Scheduled Meds: . aspirin  81 mg Oral Daily  . FLUoxetine  40 mg Oral Daily  . pantoprazole  40 mg Oral Daily   Continuous Infusions: . sodium chloride 1 mL/kg/hr (01/26/16 0700)  . heparin 850 Units/hr (01/26/16 [id number])   PRN Meds: acetaminophen, ALPRAZolam, nitroGLYCERIN, ondansetron (ZOFRAN) IV   Vital Signs    Vitals:   01/25/16 2020 01/25/16 2021 01/25/16 2142 01/26/16 0533  BP:   108/71 129/74  Pulse:   81 87  Resp: 12 12 15 16   Temp:   98.1 F (36.7 C) 98.1 F (36.7 C)  TempSrc:   Oral Oral  SpO2:   97% 98%  Weight:    203 lb 12.8 oz (92.4 kg)  Height:        Intake/Output Summary (Last 24 hours) at 01/26/16 0903 Last data filed at 01/26/16 [id number]  Gross per 24 hour  Intake           482.63 ml  Output                0 ml  Net           482.63 ml   Filed Weights   01/25/16 1215 01/25/16 1725 01/26/16 0533  Weight: 200 lb (90.7 kg) 204 lb 9.4 oz (92.8 kg) 203 lb 12.8 oz (92.4 kg)    Physical Exam   GEN: Well nourished, well developed, in no acute distress.  HEENT: Grossly normal.  Neck: Supple, no JVD, carotid bruits, or masses. Cardiac: RRR, no murmurs, rubs, or gallops. No clubbing, cyanosis, edema.  Radials/DP/PT 2+ and equal bilaterally.    Respiratory:  Respirations regular and unlabored, clear to auscultation bilaterally. GI: Soft, nontender, nondistended, BS + x 4. MS: no deformity or atrophy. Skin: warm and dry, no rash. Neuro:  Strength and sensation are intact. Psych: AAOx3.  Normal affect.  Labs    CBC  Recent Labs  01/25/16 1142 01/26/16 0331  WBC 6.1 4.1  HGB 14.0 13.3  HCT 39.8 38.7  MCV 89.6 90.0  PLT 244 123456   Basic Metabolic Panel  Recent Labs  01/25/16 1142 01/26/16 0331  NA 141 140  K 3.8 3.4*  CL 108 107  CO2 26 25  GLUCOSE 101* 94  BUN 9 10  CREATININE 0.75 0.74  CALCIUM 9.6 9.0   Liver Function Tests No results for input(s): AST, ALT, ALKPHOS, BILITOT, PROT, ALBUMIN in the last 72 hours. No results for input(s): LIPASE, AMYLASE in the last 72 hours. Cardiac Enzymes  Recent Labs  01/25/16 1524  TROPONINI <0.03   BNP Invalid input(s): POCBNP D-Dimer No results for input(s): DDIMER in the last 72 hours. Hemoglobin A1C No results for input(s): HGBA1C in the last 72 hours.  Fasting Lipid Panel No results for input(s): CHOL, HDL, LDLCALC, TRIG, CHOLHDL, LDLDIRECT in the last 72 hours. Thyroid Function Tests No results for input(s): TSH, T4TOTAL, T3FREE, THYROIDAB in the last 72 hours.  Invalid input(s): FREET3  Telemetry    NSR - Personally Reviewed   Radiology    Dg Chest 2 View  Result Date: 01/25/2016 CLINICAL DATA:  Hypertension, chest pain. EXAM: CHEST  2 VIEW COMPARISON:  Radiographs of Jun 24, 2011. FINDINGS: The heart size and mediastinal contours are within normal limits. Both lungs are clear. No pneumothorax or pleural effusion is noted. The visualized skeletal structures are unremarkable. IMPRESSION: No active cardiopulmonary disease. Electronically Signed   By: Marijo Conception, M.D.   On: 01/25/2016 11:14    Cardiac Studies   Cardiac Panel (last 3 results)  Recent Labs  01/25/16 1524  TROPONINI <0.03     Patient Profile     Maria Bishop is  a 55 y.o. female with past medical history of CAD (s/p NSTEMI in 2013 with spontaneous dissection/plaque rupture of 3rd OM with placement of 3 overlapping DES, patent by cath in 2014), GERD, anxiety, and HLD (on Praluent) who presents to [facility] ED on 01/25/2016 for evaluation of chest pain.   Assessment & Plan    1. Chest Pain Concerning for Unstable Angina/ CAD - has known history of CAD s/p NSTEMI in 2013 with spontaneous dissection/plaque rupture of 3rd OM with placement of 3 overlapping DES, with stents being patent by cath in 2014.   -Recent symptoms resemble her initial MI, although she also has features that are consistent with possible GI etiology (difficulty swallowing food and chest discomfort while eating) -  Initial troponin negative. Serial enzymes not cycled, unfortunately. Continue Heparin. Will plan for a repeat cardiac catheterization today. If no significant coronary findings on cath, consider adjustments of antacids/ outpatient GI referral.   2. HLD - Lipid Panel in 05/2015 showed total cholesterol 124, HDL 41, and LDL 63. - on Praluent as an outpatient. Intolerant to statin therapy. Lipid panel not ordered today.   3. GERD - continue PTA Protonix  4. Anxiety - continue home medications.   5. Hypokalemia:  - K was 3.4 this am. 40 mEq of KDur ordered.   Signed, Lyda Jester, PA-C  01/26/2016, 9:03 AM   Attending Note:   The patient was seen and examined.  Agree with assessment and plan as noted above.  Changes made to the above note as needed.  Patient seen and independently examined with Ellen Henri, PA .   We discussed all aspects of the encounter. I agree with the assessment and plan as stated above.  1. CAD - hx of SCAD . Now presents with more CP - similar to her presenting episodes  troponins are negative .   Were apparently negative when she presented for her original SCAD episode.   Discussed the risks, benefits, options. She  understands and agrees to proceed.    I have spent a total of 20 minutes with patient reviewing hospital  notes , telemetry, EKGs, labs and examining patient as well as establishing an assessment and plan that was discussed with the patient. > 50% of time was spent in direct patient care.    Thayer Headings, Brooke Bonito., MD, [facility] 01/26/2016, 11:05 AM [address],  [place] Pager [phone]

## 2016-01-27 ENCOUNTER — Encounter (HOSPITAL_COMMUNITY): Payer: Self-pay | Admitting: Interventional Cardiology

## 2016-01-27 DIAGNOSIS — I200 Unstable angina: Secondary | ICD-10-CM | POA: Diagnosis not present

## 2016-01-27 NOTE — Progress Notes (Signed)
Patient Name: Maria Bishop Date of Encounter: 01/27/2016  Primary Cardiologist: Dr. Tyrell Antonio Problem List     Principal Problem:   Chest pain Active Problems:   CAD (coronary artery disease)   Hyperlipidemia   History of MI (myocardial infarction)    Patient Profile     Maria Bishop is a 55 y.o. female with past medical history of CAD (s/p NSTEMI in 2013 with spontaneous dissection/plaque rupture of 3rd OM with placement of 3 overlapping DES, patent by cath in 2014), GERD, anxiety, and HLD (on Praluent) who presents to [facility] ED on 01/25/2016 for evaluation of chest pain.    Subjective   Feels fine currently. S  Cath yesterday evening showed widely patent stents. No new dissections.  Ready to go home   Inpatient Medications    Scheduled Meds: . aspirin  81 mg Oral Daily  . FLUoxetine  40 mg Oral Daily  . heparin  5,000 Units Subcutaneous Q8H  . pantoprazole  40 mg Oral Daily  . sodium chloride flush  3 mL Intravenous Q12H   Continuous Infusions:  PRN Meds: sodium chloride, acetaminophen, ALPRAZolam, nitroGLYCERIN, ondansetron (ZOFRAN) IV, oxyCODONE-acetaminophen, sodium chloride flush   Vital Signs    Vitals:   01/26/16 2033 01/26/16 2251 01/26/16 2254 01/27/16 0601  BP: 122/86   107/74  Pulse: 81 88 78 86  Resp: 12 (!) 22 14 15   Temp: 98.3 F (36.8 C)   98.3 F (36.8 C)  TempSrc: Oral   Oral  SpO2: 98% 97% 96% 97%  Weight:    204 lb 9.6 oz (92.8 kg)  Height:        Intake/Output Summary (Last 24 hours) at 01/27/16 0856 Last data filed at 01/26/16 1825  Gross per 24 hour  Intake          1267.11 ml  Output                0 ml  Net          1267.11 ml   Filed Weights   01/25/16 1725 01/26/16 0533 01/27/16 0601  Weight: 204 lb 9.4 oz (92.8 kg) 203 lb 12.8 oz (92.4 kg) 204 lb 9.6 oz (92.8 kg)    Physical Exam   GEN: Well nourished, well developed, in no acute distress.  HEENT: Grossly normal.  Neck: Supple, no JVD,  carotid bruits, or masses. Cardiac: RRR, no murmurs, rubs, or gallops. No clubbing, cyanosis, edema.  Radials/DP/PT 2+ and equal bilaterally.  Respiratory:  Respirations regular and unlabored, clear to auscultation bilaterally. GI: Soft, nontender, nondistended, BS + x 4. MS: right radial cath site looks good  Skin: warm and dry, no rash. Neuro:  Strength and sensation are intact. Psych: AAOx3.  Normal affect.  Labs    CBC  Recent Labs  01/26/16 0331 01/26/16 1909  WBC 4.1 3.8*  HGB 13.3 12.7  HCT 38.7 36.2  MCV 90.0 91.0  PLT 230 123456   Basic Metabolic Panel  Recent Labs  01/25/16 1142 01/26/16 0331 01/26/16 1909  NA 141 140  --   K 3.8 3.4*  --   CL 108 107  --   CO2 26 25  --   GLUCOSE 101* 94  --   BUN 9 10  --   CREATININE 0.75 0.74 0.78  CALCIUM 9.6 9.0  --    Liver Function Tests No results for input(s): AST, ALT, ALKPHOS, BILITOT, PROT, ALBUMIN in the last 72 hours. No results for  input(s): LIPASE, AMYLASE in the last 72 hours. Cardiac Enzymes  Recent Labs  01/25/16 1524  TROPONINI <0.03   BNP Invalid input(s): POCBNP D-Dimer No results for input(s): DDIMER in the last 72 hours. Hemoglobin A1C No results for input(s): HGBA1C in the last 72 hours. Fasting Lipid Panel No results for input(s): CHOL, HDL, LDLCALC, TRIG, CHOLHDL, LDLDIRECT in the last 72 hours. Thyroid Function Tests No results for input(s): TSH, T4TOTAL, T3FREE, THYROIDAB in the last 72 hours.  Invalid input(s): FREET3  Telemetry    NSR - Personally Reviewed   Radiology    Dg Chest 2 View  Result Date: 01/25/2016 CLINICAL DATA:  Hypertension, chest pain. EXAM: CHEST  2 VIEW COMPARISON:  Radiographs of Jun 24, 2011. FINDINGS: The heart size and mediastinal contours are within normal limits. Both lungs are clear. No pneumothorax or pleural effusion is noted. The visualized skeletal structures are unremarkable. IMPRESSION: No active cardiopulmonary disease. Electronically Signed    By: Marijo Conception, M.D.   On: 01/25/2016 11:14    Cardiac Studies   Cardiac Panel (last 3 results)  Recent Labs  01/25/16 1524  TROPONINI <0.03     Patient Profile     Maria Bishop is a 55 y.o. female with past medical history of CAD (s/p NSTEMI in 2013 with spontaneous dissection/plaque rupture of 3rd OM with placement of 3 overlapping DES, patent by cath in 2014), GERD, anxiety, and HLD (on Praluent) who presents to [facility] ED on 01/25/2016 for evaluation of chest pain.   Assessment & Plan    1. Chest Pain Concerning for Unstable Angina/ CAD - has known history of CAD s/p NSTEMI in 2013 with spontaneous dissection/plaque rupture of 3rd OM with placement of 3 overlapping DES, with stents being patent by cath in 2014. Cath yesterday showed patent stents. No further pain  Ready for DC today    2. HLD - Lipid Panel in 05/2015 showed total cholesterol 124, HDL 41, and LDL 63. - on Praluent as an outpatient. Intolerant to statin therapy. Lipid panel not ordered today.   3. GERD - continue PTA Protonix  4. Anxiety - continue home medications.     Mertie Moores, MD  01/27/2016 9:02 AM    [address],  [place], [place]  [address] Pager [phone] Phone: [phone]; Fax: [phone]

## 2016-01-27 NOTE — Discharge Summary (Signed)
Discharge Summary    Patient ID: Maria Bishop,  MRN: [id number], DOB/AGE: [birth year] 55 y.o.  Admit date: 01/25/2016 Discharge date: 01/27/2016  Primary Care Provider: Annye Asa Primary Cardiologist: Dr. Burt Knack   Discharge Diagnoses    Principal Problem:   Chest pain Active Problems:   CAD (coronary artery disease)   Hyperlipidemia   History of MI (myocardial infarction)   Allergies Allergies  Allergen Reactions  . Shellfish Allergy   . Statins     Failed Crestor 5 mg qd, Simvastatin 40 mg, and Lipitor 40 mg, and pravastatin 10 mg qd due to muscle and joint pain  . Zetia [Ezetimibe]     Depression and constipation per patient  . Mobic [Meloxicam] Rash    Diagnostic Studies/Procedures    Procedures   Left Heart Cath and Coronary Angiography 01/26/16  Conclusion    Widely patent coronary arteries. Right dominant coronary circulation. No evidence of obstructive coronary disease.  Widely patent overlapping stents from the midportion of the third obtuse marginal into the mid circumflex. No evidence of restenosis.  Low normal LV function with EF 50%. Normal filling pressure   RECOMMENDATIONS:   No evidence of coronary obstruction to explain the patient's complaints.      History of Present Illness     Maria Bridgenis a 55 y.o.femalewith past medical history of CAD (s/p NSTEMI in 2013 with spontaneous dissection/plaque rupture of 3rd OM with placement of 3 overlapping DES, patent by cath in 2014), GERD, anxiety, and HLD (on Praluent)who presented to [facility] ED on 01/25/2016 for evaluation of chest pain. Pt reported her symptoms were similar to her angina in the past. Given her history and symptoms, she was admitted for ischemic w/u.   Hospital Course     Cardiac enzymes were negative. Definitive LHC was recommended. She underwent a LHC by Dr. Tamala Julian on 01/26/16 that showed widely patent coronary arteries with no evidence of  obstructive coronary disease. She had widely patent overlapping stents w/o restenosis. LVEF was normal at 50% with normal filling pressures. No further cardiac w/u was indicated. Continued medical therapy was recommended. She was monitored and had no post cath complications. Vital signs and cath site remained stable. She was continued on ASA, Protonix and instructed to continued on Praluent for HLD. She was last seen and examined by Dr. Acie Fredrickson, who determined she was stable for dc/ home. She will continue routine f/u with Dr. Burt Knack.   Consultants: none    Discharge Vitals Blood pressure 107/74, pulse 86, temperature 98.3 F (36.8 C), temperature source Oral, resp. rate 15, height 5\' 2"  (1.575 m), weight 204 lb 9.6 oz (92.8 kg), last menstrual period 07/05/2012, SpO2 97 %.  Filed Weights   01/25/16 1725 01/26/16 0533 01/27/16 0601  Weight: 204 lb 9.4 oz (92.8 kg) 203 lb 12.8 oz (92.4 kg) 204 lb 9.6 oz (92.8 kg)    Labs & Radiologic Studies    CBC  Recent Labs  01/26/16 0331 01/26/16 1909  WBC 4.1 3.8*  HGB 13.3 12.7  HCT 38.7 36.2  MCV 90.0 91.0  PLT 230 123456   Basic Metabolic Panel  Recent Labs  01/25/16 1142 01/26/16 0331 01/26/16 1909  NA 141 140  --   K 3.8 3.4*  --   CL 108 107  --   CO2 26 25  --   GLUCOSE 101* 94  --   BUN 9 10  --   CREATININE 0.75 0.74 0.78  CALCIUM 9.6 9.0  --  Liver Function Tests No results for input(s): AST, ALT, ALKPHOS, BILITOT, PROT, ALBUMIN in the last 72 hours. No results for input(s): LIPASE, AMYLASE in the last 72 hours. Cardiac Enzymes  Recent Labs  01/25/16 1524  TROPONINI <0.03   BNP Invalid input(s): POCBNP D-Dimer No results for input(s): DDIMER in the last 72 hours. Hemoglobin A1C No results for input(s): HGBA1C in the last 72 hours. Fasting Lipid Panel No results for input(s): CHOL, HDL, LDLCALC, TRIG, CHOLHDL, LDLDIRECT in the last 72 hours. Thyroid Function Tests No results for input(s): TSH, T4TOTAL, T3FREE,  THYROIDAB in the last 72 hours.  Invalid input(s): FREET3 _____________  Dg Chest 2 View  Result Date: 01/25/2016 CLINICAL DATA:  Hypertension, chest pain. EXAM: CHEST  2 VIEW COMPARISON:  Radiographs of Jun 24, 2011. FINDINGS: The heart size and mediastinal contours are within normal limits. Both lungs are clear. No pneumothorax or pleural effusion is noted. The visualized skeletal structures are unremarkable. IMPRESSION: No active cardiopulmonary disease. Electronically Signed   By: Marijo Conception, M.D.   On: 01/25/2016 11:14   Disposition   Pt is being discharged home today in good condition.  Follow-up Plans & Appointments    Follow-up Information    Sherren Mocha, MD Follow up.   Specialty:  Cardiology Why:  our office will call you with a follow-up visit in 2 weeks Contact information: [address] [place] [address] [phone]          Discharge Instructions    Diet - low sodium heart healthy    Complete by:  As directed    Increase activity slowly    Complete by:  As directed       Discharge Medications   Discharge Medication List as of 01/27/2016 11:30 AM    CONTINUE these medications which have NOT CHANGED   Details  Alirocumab (PRALUENT) 75 MG/ML SOPN Inject 75 mg into the skin every 14 (fourteen) days., Starting Fri 02/26/2015, Normal    ALPRAZolam (XANAX) 0.5 MG tablet TAKE 1 TABLET BY MOUTH 2 TIMES DAILY AS NEEDED, Print    aspirin 81 MG tablet Take 81 mg by mouth daily., Historical Med    fexofenadine (ALLEGRA) 180 MG tablet Take 180 mg by mouth daily. Reported on 06/02/2015, Until Discontinued, Historical Med    FLUoxetine (PROZAC) 40 MG capsule TAKE 1 CAPSULE BY MOUTH EVERY DAY, Normal    ibuprofen (ADVIL,MOTRIN) 200 MG tablet Take 800 mg by mouth every 6 (six) hours as needed. For pain, Until Discontinued, Historical Med    nitroGLYCERIN (NITROSTAT) 0.4 MG SL tablet Place 1 tablet (0.4 mg total) under the tongue every 5  (five) minutes as needed., Starting Wed 11/26/2013, Print    pantoprazole (PROTONIX) 40 MG tablet TAKE 1 TABLET BY MOUTH EVERY DAY, Normal      STOP taking these medications     traMADol (ULTRAM) 50 MG tablet         Outstanding Labs/Studies   None   Duration of Discharge Encounter   Greater than 30 minutes including physician time.  Signed, Lyda Jester PA-C 01/27/2016, 11:53 AM  Attending Note:   The patient was seen and examined.  Agree with assessment and plan as noted above.  Changes made to the above note as needed.  Patient seen and independently examined with Brittainy Simmons PA-C.   We discussed all aspects of the encounter. I agree with the assessment and plan as stated above.  See progress note. Cath looked great  Patient feels better Ready for DC    I have spent a total of 40 minutes with patient reviewing hospital  notes , telemetry, EKGs, labs and examining patient as well as establishing an assessment and plan that was discussed with the patient. > 50% of time was spent in direct patient care.    Thayer Headings, Brooke Bonito., MD, [facility] 01/27/2016, 4:17 PM [address]  [place] Pager [phone]

## 2016-03-09 ENCOUNTER — Other Ambulatory Visit: Payer: Self-pay | Admitting: Family Medicine

## 2016-03-09 NOTE — Telephone Encounter (Signed)
Last OV 10/08/15 Alprazolam last filled 12/20/15 #90 with 1  SIG take 1 tablet BID PRN

## 2016-03-09 NOTE — Telephone Encounter (Signed)
Medication filled to pharmacy as requested.   

## 2016-05-24 ENCOUNTER — Encounter: Payer: Self-pay | Admitting: Family Medicine

## 2016-06-08 ENCOUNTER — Other Ambulatory Visit: Payer: Self-pay | Admitting: Family Medicine

## 2016-06-08 NOTE — Telephone Encounter (Signed)
Last OV 10/08/15 (cyst) protonix last filed 11/23/15 #90 with 1

## 2016-08-07 ENCOUNTER — Other Ambulatory Visit: Payer: Self-pay | Admitting: Family Medicine

## 2016-08-07 NOTE — Telephone Encounter (Signed)
Last OV 10/08/15 Alprazolam last filled 03/09/16 #90 with 1  [facility],

## 2016-08-07 NOTE — Telephone Encounter (Signed)
Medication filled to pharmacy as requested.   

## 2016-08-07 NOTE — Telephone Encounter (Signed)
[place] for #90, no refills if Einar Pheasant will sign

## 2016-08-29 ENCOUNTER — Other Ambulatory Visit: Payer: Self-pay | Admitting: Family Medicine

## 2016-11-15 ENCOUNTER — Telehealth: Payer: Self-pay | Admitting: Pharmacist

## 2016-11-15 NOTE — Telephone Encounter (Signed)
Received notification on 09/12/16 that we needed new insurance information to submit new prior authorization so pt can remain on Praluent. Tana Coast and myself have attempted to reach pt 6 times over the past few months and have been unable to do so.  8/7 Georgina Peer [facility] to f/u 8/30 Georgina Peer [facility] to f/u 9/5 Georgina Peer [facility] to f/u 9/7 Georgina Peer contacted pt's pharmacy and they did not have updated insurance information either 9/28 - Megan [facility] to f/u 10/10 - Megan [facility] to f/u  Will make no further attempts to reach out to pt since we have tried unsuccessfully for the last 3 months. Will await call back from pt to provide [place] with new insurance information.

## 2016-11-20 ENCOUNTER — Other Ambulatory Visit: Payer: Self-pay | Admitting: General Practice

## 2016-11-20 ENCOUNTER — Encounter: Payer: Self-pay | Admitting: General Practice

## 2016-11-20 MED ORDER — ALPRAZOLAM 0.5 MG PO TABS: ORAL_TABLET | ORAL | 0 refills | Status: DC

## 2016-11-20 MED ORDER — PANTOPRAZOLE SODIUM 40 MG PO TBEC
40.0000 mg | DELAYED_RELEASE_TABLET | Freq: Every day | ORAL | 0 refills | Status: DC
Start: 2016-11-20 — End: 2017-07-05

## 2016-11-20 NOTE — Telephone Encounter (Signed)
Last OV 10/08/15 (Sebaceous Cyst) Alprazolam last filled 08/07/16 #90 with 0   No upcoming appts

## 2016-11-20 NOTE — Telephone Encounter (Signed)
Medication filled to pharmacy as requested.   

## 2016-11-22 ENCOUNTER — Ambulatory Visit (INDEPENDENT_AMBULATORY_CARE_PROVIDER_SITE_OTHER): Payer: 59 | Admitting: Family Medicine

## 2016-11-22 ENCOUNTER — Encounter: Payer: Self-pay | Admitting: Family Medicine

## 2016-11-22 VITALS — BP 133/93 | HR 90 | Temp 98.5°F | Resp 16 | Ht 62.0 in | Wt 207.5 lb

## 2016-11-22 DIAGNOSIS — I2510 Atherosclerotic heart disease of native coronary artery without angina pectoris: Secondary | ICD-10-CM

## 2016-11-22 DIAGNOSIS — E785 Hyperlipidemia, unspecified: Secondary | ICD-10-CM | POA: Diagnosis not present

## 2016-11-22 DIAGNOSIS — F418 Other specified anxiety disorders: Secondary | ICD-10-CM | POA: Diagnosis not present

## 2016-11-22 LAB — LIPID PANEL
Cholesterol: 197 mg/dL (ref 0–200)
HDL: 36.1 mg/dL — ABNORMAL LOW (ref >39.00)
LDL Cholesterol: 137 mg/dL — ABNORMAL HIGH (ref 0–99)
NonHDL: 161.21
Total CHOL/HDL Ratio: 5
Triglycerides: 120 mg/dL (ref 0.0–149.0)
VLDL: 24 mg/dL (ref 0.0–40.0)

## 2016-11-22 LAB — CBC WITH DIFFERENTIAL/PLATELET
Basophils Absolute: 0 10*3/uL (ref 0.0–0.1)
Basophils Relative: 0.4 % (ref 0.0–3.0)
Eosinophils Absolute: 0.1 10*3/uL (ref 0.0–0.7)
Eosinophils Relative: 2 % (ref 0.0–5.0)
HCT: 40 % (ref 36.0–46.0)
Hemoglobin: 13.8 g/dL (ref 12.0–15.0)
Lymphocytes Relative: 29.4 % (ref 12.0–46.0)
Lymphs Abs: 1.2 10*3/uL (ref 0.7–4.0)
MCHC: 34.5 g/dL (ref 30.0–36.0)
MCV: 91.9 fl (ref 78.0–100.0)
Monocytes Absolute: 0.3 10*3/uL (ref 0.1–1.0)
Monocytes Relative: 7.7 % (ref 3.0–12.0)
Neutro Abs: 2.6 10*3/uL (ref 1.4–7.7)
Neutrophils Relative %: 60.5 % (ref 43.0–77.0)
Platelets: 214 10*3/uL (ref 150.0–400.0)
RBC: 4.35 Mil/uL (ref 3.87–5.11)
RDW: 13.4 % (ref 11.5–15.5)
WBC: 4.2 10*3/uL (ref 4.0–10.5)

## 2016-11-22 LAB — BASIC METABOLIC PANEL
BUN: 17 mg/dL (ref 6–23)
CO2: 27 mEq/L (ref 19–32)
Calcium: 9.6 mg/dL (ref 8.4–10.5)
Chloride: 106 mEq/L (ref 96–112)
Creatinine, Ser: 0.79 mg/dL (ref 0.40–1.20)
GFR: 80.62 mL/min (ref >60.00)
Glucose, Bld: 100 mg/dL — ABNORMAL HIGH (ref 70–99)
Potassium: 4.1 mEq/L (ref 3.5–5.1)
Sodium: 141 mEq/L (ref 135–145)

## 2016-11-22 LAB — HEPATIC FUNCTION PANEL
ALT: 14 U/L (ref 0–35)
AST: 16 U/L (ref 0–37)
Albumin: 4.4 g/dL (ref 3.5–5.2)
Alkaline Phosphatase: 76 U/L (ref 39–117)
Bilirubin, Direct: 0.1 mg/dL (ref 0.0–0.3)
Total Bilirubin: 0.7 mg/dL (ref 0.2–1.2)
Total Protein: 6.7 g/dL (ref 6.0–8.3)

## 2016-11-22 LAB — TSH: TSH: 2.08 u[IU]/mL (ref 0.35–4.50)

## 2016-11-22 MED ORDER — BUPROPION HCL ER (XL) 150 MG PO TB24: 150.0000 mg | ORAL_TABLET | Freq: Every day | ORAL | 3 refills | Status: DC

## 2016-11-22 NOTE — Assessment & Plan Note (Signed)
Chronic problem.  No longer on Praluent b/c cards has not been able to contact pt.  Will get labs today to assess.  Cautioned her that this is risky in the setting of previous MI- she does not seem to care.

## 2016-11-22 NOTE — Progress Notes (Signed)
   Subjective:    Patient ID: [name], female    DOB: 02-Sep-1960, 56 y.o.   MRN: [id number]  HPI Anxiety/depression- chronic problem, taking Prozac daily but not feeling good.  PHQ 9 score 18.  Pt has been in counseling previously and doesn't find this particularly helpful.  Is willing to add or change medications.  Finds herself withdrawing- 'i go to work and I go home'.  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  Pt is able to contract for safety.  CAD- hx of MI, pt has not followed up w/ Cards this year.  Is no longer on Praluent b/c she has not contacted Cards.  Overdue for repeat lipids.   Review of Systems For ROS see HPI     Objective:   Physical Exam  Constitutional: She is oriented to person, place, and time. She appears well-developed and well-nourished. No distress.  HENT:  Head: Normocephalic and atraumatic.  Eyes: Pupils are equal, round, and reactive to light. Conjunctivae and EOM are normal.  Neck: Normal range of motion. Neck supple. No thyromegaly present.  Cardiovascular: Normal rate, regular rhythm, normal heart sounds and intact distal pulses.   No murmur heard. Pulmonary/Chest: Effort normal and breath sounds normal. No respiratory distress.  Abdominal: Soft. She exhibits no distension. There is no tenderness.  Musculoskeletal: She exhibits no edema.  Lymphadenopathy:    She has no cervical adenopathy.  Neurological: She is alert and oriented to person, place, and time.  Skin: Skin is warm and dry.  Psychiatric: Her behavior is normal.  Tearful throughout visit  Vitals reviewed.         Assessment & Plan:

## 2016-11-22 NOTE — Patient Instructions (Signed)
Follow up in 4 weeks to recheck mood We'll notify you of your lab results and make any changes if needed Try and work on regular exercise- this will improve weight and also your mood ADD the Wellbutrin to the Prozac daily Call with any questions or concerns Hang in there! We're here for you!!!

## 2016-11-22 NOTE — Assessment & Plan Note (Signed)
Chronic problem.  No longer on Praluent.  Is taking ASA daily.  Has not followed up w/ cards this year.  Encouraged her to do so.  Will also route note to Dr Burt Knack.

## 2016-11-22 NOTE — Assessment & Plan Note (Signed)
Deteriorated.  Pt's PHQ 9 score is 18.  She admits to daily thoughts of self harm but is able to contract for safety today and states she will 'think about it but not actually do it'.  Discussed going to [facility] but she states she is not able to take off work for this.  Not interested in counseling.  Will add Wellbutrin.  Will follow closely.

## 2016-11-23 ENCOUNTER — Encounter: Payer: Self-pay | Admitting: General Practice

## 2016-12-06 ENCOUNTER — Telehealth: Payer: Self-pay | Admitting: Cardiovascular Disease

## 2016-12-06 NOTE — Telephone Encounter (Signed)
Patient returned call.  Insurance is thru [place], loaded into the system under [employer] 11/22/16 [employer] card)

## 2016-12-06 NOTE — Telephone Encounter (Signed)
F/u message ° °Pt returning call .please call back to discuss  °

## 2016-12-07 NOTE — Telephone Encounter (Signed)
Prior authorization submitted.

## 2016-12-18 ENCOUNTER — Other Ambulatory Visit: Payer: Self-pay | Admitting: Family Medicine

## 2016-12-18 NOTE — Telephone Encounter (Signed)
Last OV 11/22/16 alprazolam last filled 11/20/16 330 with 0

## 2016-12-18 NOTE — Telephone Encounter (Signed)
Medication filled to pharmacy as requested.   

## 2016-12-19 ENCOUNTER — Other Ambulatory Visit: Payer: Self-pay | Admitting: Pharmacist

## 2016-12-19 MED ORDER — ALIROCUMAB 75 MG/ML ~~LOC~~ SOPN: 75.0000 mg | PEN_INJECTOR | SUBCUTANEOUS | 11 refills | Status: DC

## 2016-12-19 NOTE — Telephone Encounter (Signed)
PA approved through 06/06/17. New rx sent to [facility].

## 2016-12-22 ENCOUNTER — Ambulatory Visit (INDEPENDENT_AMBULATORY_CARE_PROVIDER_SITE_OTHER): Payer: 59 | Admitting: Family Medicine

## 2016-12-22 ENCOUNTER — Other Ambulatory Visit: Payer: Self-pay

## 2016-12-22 ENCOUNTER — Encounter: Payer: Self-pay | Admitting: Family Medicine

## 2016-12-22 ENCOUNTER — Telehealth: Payer: Self-pay | Admitting: *Deleted

## 2016-12-22 VITALS — BP 112/82 | HR 92 | Temp 98.1°F | Resp 17 | Ht 62.0 in | Wt 207.4 lb

## 2016-12-22 DIAGNOSIS — R300 Dysuria: Secondary | ICD-10-CM

## 2016-12-22 DIAGNOSIS — R319 Hematuria, unspecified: Secondary | ICD-10-CM | POA: Diagnosis not present

## 2016-12-22 DIAGNOSIS — R82998 Other abnormal findings in urine: Secondary | ICD-10-CM | POA: Diagnosis not present

## 2016-12-22 DIAGNOSIS — H60501 Unspecified acute noninfective otitis externa, right ear: Secondary | ICD-10-CM | POA: Diagnosis not present

## 2016-12-22 DIAGNOSIS — F418 Other specified anxiety disorders: Secondary | ICD-10-CM

## 2016-12-22 LAB — POCT URINALYSIS DIPSTICK
Bilirubin, UA: NEGATIVE
Glucose, UA: NEGATIVE
Ketones, UA: NEGATIVE
Protein, UA: NEGATIVE
Spec Grav, UA: 1.02 (ref 1.010–1.025)
Urobilinogen, UA: 0.2 E.U./dL
pH, UA: 5 (ref 5.0–8.0)

## 2016-12-22 MED ORDER — NEOMYCIN-COLIST-HC-THONZONIUM 3.3-3-10-0.5 MG/ML OT SUSP: 4.0000 [drp] | Freq: Four times a day (QID) | OTIC | 0 refills | Status: DC

## 2016-12-22 MED ORDER — CEPHALEXIN 500 MG PO CAPS: 500.0000 mg | ORAL_CAPSULE | Freq: Two times a day (BID) | ORAL | 0 refills | Status: DC

## 2016-12-22 NOTE — Patient Instructions (Addendum)
Schedule your complete physical in 6 months Continue the Wellbutrin and Prozac daily START the Keflex twice daily for the UTI Drink plenty of fluids Use the ear drops- 4 drops 3x/day x7 days Call with any questions or concerns Happy Thanksgiving!!!

## 2016-12-22 NOTE — Progress Notes (Signed)
   Subjective:    Patient ID: Maria Bishop, female    DOB: 02/01/1961, 56 y.o.   MRN: [id number]  HPI Anxiety/depression- at last visit, Wellbutrin was added b/c pt's sxs were not at all controlled on Prozac.  Pt reports she is 'feeling better'- no longer 'thinking of going off the deep end', able to 'make it through the day at work'.  Pt still has no desire to participate in activities.  Pt reports some 'dizziness, nausea, dry mouth, sweating, loss of appetite, tinnitus' since starting the meds.  ? UTI- pt has sensation of UTI x1 week.  sxs improve w/ AZO.  + strong urinary odor.  R ear pain- pt reports ear is tender to touch x1 week, ear canal is swollen.  No drainage.  Has been using OTC swimmers ear products   Review of Systems For ROS see HPI     Objective:   Physical Exam  Constitutional: She is oriented to person, place, and time. She appears well-developed and well-nourished. No distress.  HENT:  Head: Normocephalic and atraumatic.  Right Ear: Tympanic membrane normal. There is swelling (of EAC w/ erythema) and tenderness (pain w/ manipulation of pinna). No drainage.  Left Ear: Tympanic membrane normal.  Nose: Mucosal edema and rhinorrhea present. Right sinus exhibits no maxillary sinus tenderness and no frontal sinus tenderness. Left sinus exhibits no maxillary sinus tenderness and no frontal sinus tenderness.  Mouth/Throat: Mucous membranes are normal. Posterior oropharyngeal erythema (w/ PND) present.  Eyes: Conjunctivae and EOM are normal. Pupils are equal, round, and reactive to light.  Neck: Normal range of motion. Neck supple.  Cardiovascular: Normal rate, regular rhythm and normal heart sounds.  Pulmonary/Chest: Effort normal and breath sounds normal. No respiratory distress. She has no wheezes. She has no rales.  Abdominal: Soft. Bowel sounds are normal. She exhibits no distension. There is no tenderness (no suprapubic or CVA tenderness).  Lymphadenopathy:    She  has no cervical adenopathy.  Neurological: She is alert and oriented to person, place, and time.  Skin: Skin is warm and dry.  Psychiatric: She has a normal mood and affect. Her behavior is normal. Thought content normal.  Vitals reviewed.         Assessment & Plan:  Anxiety/depression- improved since adding Wellbutrin to Prozac.  She is having some mild side effects that she attributes to the medication but she feels the improvement in mood is worth it.  No med changes at this time.  Will follow.  R Otitis Externa- new.  Start Cortisporin Otic.  Reviewed supportive care and red flags that should prompt return.  Pt expressed understanding and is in agreement w/ plan.   UTI- new.  Pt's sxs and UA consistent w/ infxn.  Start abx.  Reviewed supportive care and red flags that should prompt return.  Pt expressed understanding and is in agreement w/ plan.

## 2016-12-22 NOTE — Telephone Encounter (Signed)
Question was that the signature said "take 1 capsule by mouth two times daily for 10 days" but only 10 tablets were called in.  Verified with Dr. Birdie Riddle that it should be for 5 days, not 10.  They also stated that ear drops that were sent in would cost patient $85.   Okay given to give generic cortisporin that would only cost patient $15.00.   Copied from [place] [phone]. Topic: Inquiry >> Dec 22, 2016  9:06 AM Maria Bishop, NT wrote: Reason for CRM: [facility] on [address]  dr has questions about a prescription for cephalexin 500 mg  please call [phone]

## 2016-12-25 LAB — URINE CULTURE
MICRO NUMBER:: 81295810
SPECIMEN QUALITY:: ADEQUATE

## 2017-01-01 ENCOUNTER — Encounter: Payer: Self-pay | Admitting: Family Medicine

## 2017-01-01 ENCOUNTER — Other Ambulatory Visit: Payer: Self-pay | Admitting: Family Medicine

## 2017-01-01 MED ORDER — CEPHALEXIN 500 MG PO CAPS: 500.0000 mg | ORAL_CAPSULE | Freq: Two times a day (BID) | ORAL | 0 refills | Status: AC

## 2017-01-13 ENCOUNTER — Other Ambulatory Visit: Payer: Self-pay | Admitting: Family Medicine

## 2017-01-17 NOTE — Telephone Encounter (Signed)
Spoke with pharmacy SIG is "take 1 tablet by mouth BID as needed" pt had first rx of #30 filled on 12/22/16 and second Rx filled on 01/13/17 #30 .   Please advise?

## 2017-01-17 NOTE — Telephone Encounter (Signed)
Last OV 12/22/16 Alprazolam last filled 12/18/16 #30 with 1

## 2017-01-17 NOTE — Telephone Encounter (Signed)
Since pt picked up 4 days ago will hold on refill until closer to 1/8

## 2017-01-17 NOTE — Telephone Encounter (Signed)
Pt was given #30 w/ 1 refill just 1 month ago- she should have a refill remaining

## 2017-02-09 ENCOUNTER — Other Ambulatory Visit: Payer: Self-pay | Admitting: Family Medicine

## 2017-02-09 NOTE — Telephone Encounter (Signed)
Medication filled to pharmacy as requested.   

## 2017-02-09 NOTE — Telephone Encounter (Signed)
Last OV 12/22/16 Alprazolam last filled 12/18/16 #30 with 1

## 2017-03-21 ENCOUNTER — Other Ambulatory Visit: Payer: Self-pay | Admitting: Family Medicine

## 2017-03-21 ENCOUNTER — Encounter: Payer: Self-pay | Admitting: Family Medicine

## 2017-03-26 ENCOUNTER — Encounter: Payer: Self-pay | Admitting: Physician Assistant

## 2017-03-26 ENCOUNTER — Other Ambulatory Visit: Payer: Self-pay

## 2017-03-26 ENCOUNTER — Ambulatory Visit (INDEPENDENT_AMBULATORY_CARE_PROVIDER_SITE_OTHER): Payer: 59 | Admitting: Physician Assistant

## 2017-03-26 VITALS — BP 120/82 | HR 102 | Temp 98.4°F | Resp 14 | Ht 62.0 in | Wt 201.0 lb

## 2017-03-26 DIAGNOSIS — H01119 Allergic dermatitis of unspecified eye, unspecified eyelid: Secondary | ICD-10-CM

## 2017-03-26 MED ORDER — AZELASTINE HCL 0.05 % OP SOLN: 1.0000 [drp] | Freq: Two times a day (BID) | OPHTHALMIC | 0 refills | Status: DC

## 2017-03-26 NOTE — Progress Notes (Signed)
Patient presents to clinic today c/o swelling of eyelids bilaterally over the past few days. Notes this has occurred over the past few weeks although not as severely after applying her eye makeup. Notes itching and burning of eyelids. Denies fever, chills, drainage. Has taken Benadryl with minimal relief. Denies dyspnea.   Past Medical History:  Diagnosis Date  . CAD (coronary artery disease)    a. NSTEMI 2013: likely spontaneous dissection/ruptured plaque in OM3 - PCI: 3 overlapping Promus DES to OM3, EF 55% b. cath 2014: patent stents  . Cervical cancer ([place])   . Degenerative disc disease   . Myocardial infarction ([place])    ~33yrs ago  . NSTEMI (non-ST elevated myocardial infarction) ([place]) 06/24/2011   a. NSTEMI 2013: likely spontaneous dissection/ruptured plaque in OM3 - PCI: 3 overlapping Promus DES to OM3, EF 55%     Current Outpatient Medications on File Prior to Visit  Medication Sig Dispense Refill  . Alirocumab (PRALUENT) 75 MG/ML SOPN Inject 75 mg every 14 (fourteen) days into the skin. 2 pen 11  . ALPRAZolam (XANAX) 0.5 MG tablet TAKE 1 TABLET BY MOUTH 2 TIMES DAILY AS NEEDED 30 tablet 3  . aspirin 81 MG tablet Take 81 mg by mouth daily.    Marland Kitchen buPROPion (WELLBUTRIN XL) 150 MG 24 hr tablet TAKE 1 TABLET BY MOUTH EVERY DAY 30 tablet 3  . fexofenadine (ALLEGRA) 180 MG tablet Take 180 mg by mouth daily. Reported on 06/02/2015    . FLUoxetine (PROZAC) 40 MG capsule TAKE 1 CAPSULE BY MOUTH EVERY DAY 90 capsule 0  . ibuprofen (ADVIL,MOTRIN) 200 MG tablet Take 800 mg by mouth every 6 (six) hours as needed. For pain    . pantoprazole (PROTONIX) 40 MG tablet Take 1 tablet (40 mg total) by mouth daily. 90 tablet 0  . nitroGLYCERIN (NITROSTAT) 0.4 MG SL tablet Place 1 tablet (0.4 mg total) under the tongue every 5 (five) minutes as needed. (Patient not taking: Reported on 11/22/2016) 25 tablet 3   No current facility-administered medications on file prior to visit.     Allergies    Allergen Reactions  . Shellfish Allergy   . Statins     Failed Crestor 5 mg qd, Simvastatin 40 mg, and Lipitor 40 mg, and pravastatin 10 mg qd due to muscle and joint pain  . Zetia [Ezetimibe]     Depression and constipation per patient  . Mobic [Meloxicam] Rash    Family History  Problem Relation Age of Onset  . Alzheimer's disease Maternal Uncle   . Coronary artery disease Mother        MI in her 5s  . Hyperlipidemia Other   . Diabetes Other   . Cancer Other     Social History   Socioeconomic History  . Marital status: Single    Spouse name: None  . Number of children: 2  . Years of education: None  . Highest education level: None  Social Needs  . Financial resource strain: None  . Food insecurity - worry: None  . Food insecurity - inability: None  . Transportation needs - medical: None  . Transportation needs - non-medical: None  Occupational History  . Occupation: Customer service manager  Tobacco Use  . Smoking status: Former Smoker    Types: Cigarettes  . Smokeless tobacco: Never Used  Substance and Sexual Activity  . Alcohol use: Yes    Alcohol/week: 0.0 oz    Comment: 1-2 times a week  . Drug use: No  .  Sexual activity: Yes    Birth control/protection: Condom  Other Topics Concern  . None  Social History Narrative  . None   Review of Systems - See HPI.  All other ROS are negative.  BP 120/82   Pulse (!) 102   Temp 98.4 F (36.9 C) (Oral)   Resp 14   Ht 5\' 2"  (1.575 m)   Wt 201 lb (91.2 kg)   LMP 07/05/2012   SpO2 98%   BMI 36.76 kg/m   Physical Exam  Constitutional: She is oriented to person, place, and time and well-developed, well-nourished, and in no distress.  HENT:  Head: Normocephalic and atraumatic.  Eyes: Conjunctivae are normal.  Neck: Neck supple.  Cardiovascular: Normal rate, regular rhythm, normal heart sounds and intact distal pulses.  Pulmonary/Chest: Effort normal and breath sounds normal. No respiratory distress. She has  no wheezes. She has no rales. She exhibits no tenderness.  Neurological: She is alert and oriented to person, place, and time.  Skin: Skin is warm and dry. No rash noted.  Psychiatric: Affect normal.  Vitals reviewed.  Assessment/Plan: 1. Allergic blepharitis, unspecified laterality She is to discontinue offending agent. Benadryl at night and Claritin AM for a couple of days. Rx Azelastine OP. Supportive measures reviewed. Follow-up if not improving.     Leeanne Rio, PA-C

## 2017-03-26 NOTE — Patient Instructions (Signed)
Keep skin clean and dry. No more mascara! Take A bendaryl at night and a claritin each morning for 3-4 days while this completely resolves. Start the Astelin eye drops as directed. You can apply a very small amount of OTC hydrocortisone to the eye lids only once a day for a couple of days. Do not use longer than this.   Call or return if symptoms are not continuing to resolve.

## 2017-04-09 ENCOUNTER — Other Ambulatory Visit: Payer: Self-pay | Admitting: Family Medicine

## 2017-04-17 ENCOUNTER — Encounter: Payer: Self-pay | Admitting: Physician Assistant

## 2017-04-17 ENCOUNTER — Encounter: Payer: Self-pay | Admitting: Family Medicine

## 2017-04-17 ENCOUNTER — Other Ambulatory Visit: Payer: Self-pay

## 2017-04-17 ENCOUNTER — Ambulatory Visit (INDEPENDENT_AMBULATORY_CARE_PROVIDER_SITE_OTHER): Payer: 59 | Admitting: Physician Assistant

## 2017-04-17 VITALS — BP 110/80 | HR 105 | Temp 98.9°F | Resp 16 | Ht 62.0 in | Wt 202.0 lb

## 2017-04-17 DIAGNOSIS — B9789 Other viral agents as the cause of diseases classified elsewhere: Secondary | ICD-10-CM | POA: Diagnosis not present

## 2017-04-17 DIAGNOSIS — J069 Acute upper respiratory infection, unspecified: Secondary | ICD-10-CM

## 2017-04-17 LAB — POC INFLUENZA A&B (BINAX/QUICKVUE)
Influenza A, POC: NEGATIVE
Influenza B, POC: NEGATIVE

## 2017-04-17 MED ORDER — BENZONATATE 100 MG PO CAPS: 100.0000 mg | ORAL_CAPSULE | Freq: Two times a day (BID) | ORAL | 0 refills | Status: DC | PRN

## 2017-04-17 NOTE — Progress Notes (Signed)
Patient presents to clinic today c/o < 48 hours of chills, aches, nasal congestion and sore throat. Denies recent travel. Notes contacts that have had similar symptoms. Denies sinus pain, ear pain, tooth pain, chest pain or SOB. Cough is mostly non-productive but notes chest congestion. Has taken Mucinex-DM. Has not had her flu shot this year.   Past Medical History:  Diagnosis Date  . CAD (coronary artery disease)    a. NSTEMI 2013: likely spontaneous dissection/ruptured plaque in OM3 - PCI: 3 overlapping Promus DES to OM3, EF 55% b. cath 2014: patent stents  . Cervical cancer ([place])   . Degenerative disc disease   . Myocardial infarction ([place])    ~46yrs ago  . NSTEMI (non-ST elevated myocardial infarction) ([place]) 06/24/2011   a. NSTEMI 2013: likely spontaneous dissection/ruptured plaque in OM3 - PCI: 3 overlapping Promus DES to OM3, EF 55%     Current Outpatient Medications on File Prior to Visit  Medication Sig Dispense Refill  . Alirocumab (PRALUENT) 75 MG/ML SOPN Inject 75 mg every 14 (fourteen) days into the skin. 2 pen 11  . ALPRAZolam (XANAX) 0.5 MG tablet TAKE 1 TABLET BY MOUTH 2 TIMES DAILY AS NEEDED 30 tablet 3  . aspirin 81 MG tablet Take 81 mg by mouth daily.    Marland Kitchen azelastine (OPTIVAR) 0.05 % ophthalmic solution Place 1 drop into both eyes 2 (two) times daily. 6 mL 0  . buPROPion (WELLBUTRIN XL) 150 MG 24 hr tablet TAKE 1 TABLET BY MOUTH EVERY DAY 30 tablet 3  . fexofenadine (ALLEGRA) 180 MG tablet Take 180 mg by mouth daily. Reported on 06/02/2015    . FLUoxetine (PROZAC) 40 MG capsule TAKE 1 CAPSULE BY MOUTH EVERY DAY 90 capsule 0  . ibuprofen (ADVIL,MOTRIN) 200 MG tablet Take 800 mg by mouth every 6 (six) hours as needed. For pain    . nitroGLYCERIN (NITROSTAT) 0.4 MG SL tablet Place 1 tablet (0.4 mg total) under the tongue every 5 (five) minutes as needed. 25 tablet 3  . pantoprazole (PROTONIX) 40 MG tablet Take 1 tablet (40 mg total) by mouth daily. 90 tablet 0   No  current facility-administered medications on file prior to visit.     Allergies  Allergen Reactions  . Shellfish Allergy   . Statins     Failed Crestor 5 mg qd, Simvastatin 40 mg, and Lipitor 40 mg, and pravastatin 10 mg qd due to muscle and joint pain  . Zetia [Ezetimibe]     Depression and constipation per patient  . Mobic [Meloxicam] Rash    Family History  Problem Relation Age of Onset  . Alzheimer's disease Maternal Uncle   . Coronary artery disease Mother        MI in her 72s  . Hyperlipidemia Other   . Diabetes Other   . Cancer Other     Social History   Socioeconomic History  . Marital status: Single    Spouse name: None  . Number of children: 2  . Years of education: None  . Highest education level: None  Social Needs  . Financial resource strain: None  . Food insecurity - worry: None  . Food insecurity - inability: None  . Transportation needs - medical: None  . Transportation needs - non-medical: None  Occupational History  . Occupation: Customer service manager  Tobacco Use  . Smoking status: Former Smoker    Types: Cigarettes  . Smokeless tobacco: Never Used  Substance and Sexual Activity  . Alcohol use:  Yes    Alcohol/week: 0.0 oz    Comment: 1-2 times a week  . Drug use: No  . Sexual activity: Yes    Birth control/protection: Condom  Other Topics Concern  . None  Social History Narrative  . None   Review of Systems - See HPI.  All other ROS are negative.  BP 110/80   Pulse (!) 105   Temp 98.9 F (37.2 C) (Oral)   Resp 16   Ht 5\' 2"  (1.575 m)   Wt 202 lb (91.6 kg)   LMP 07/05/2012   SpO2 97%   BMI 36.95 kg/m   Physical Exam  Constitutional: She is oriented to person, place, and time and well-developed, well-nourished, and in no distress.  HENT:  Head: Normocephalic and atraumatic.  Right Ear: Tympanic membrane normal.  Left Ear: Tympanic membrane normal.  Nose: Nose normal. Right sinus exhibits no maxillary sinus tenderness and no  frontal sinus tenderness. Left sinus exhibits no maxillary sinus tenderness and no frontal sinus tenderness.  Mouth/Throat: Uvula is midline, oropharynx is clear and moist and mucous membranes are normal.  Eyes: Conjunctivae are normal.  Neck: Neck supple.  Cardiovascular: Normal rate, regular rhythm, normal heart sounds and intact distal pulses.  Pulmonary/Chest: Effort normal and breath sounds normal. No respiratory distress. She has no wheezes. She has no rales. She exhibits no tenderness.  Neurological: She is alert and oriented to person, place, and time.  Skin: Skin is warm and dry. No rash noted.  Psychiatric: Affect normal.  Vitals reviewed.  Assessment/Plan: 1. Viral URI with cough Flu swab negative. Exam negative for sign of bacterial infection. Supportive measures and OTC medications reviewed. Rx Tessalon to help with cough. Follow-up if not improving over the next 4-5 days.  - POC Influenza A&B (Binax test)   Leeanne Rio, PA-C

## 2017-04-17 NOTE — Patient Instructions (Signed)
Flu-swab is negative.  Stay well-hydrated and get plenty of rest.  Symptoms are consistent with a non-flu viral illness.  Take the Mucinex-DM along with the Tessalon I have sent in for cough.  Continue allergy medication and start saline nasal rinses to help flush out nasal passages.  Usually days 2-4 are the worst and then symptoms start to improve and resolve.  Please call or return to clinic if you note any new or worsening symptoms.    Upper Respiratory Infection, Adult Most upper respiratory infections (URIs) are caused by a virus. A URI affects the nose, throat, and upper air passages. The most common type of URI is often called "the common cold." Follow these instructions at home:  Take medicines only as told by your doctor.  Gargle warm saltwater or take cough drops to comfort your throat as told by your doctor.  Use a warm mist humidifier or inhale steam from a shower to increase air moisture. This may make it easier to breathe.  Drink enough fluid to keep your pee (urine) clear or pale yellow.  Eat soups and other clear broths.  Have a healthy diet.  Rest as needed.  Go back to work when your fever is gone or your doctor says it is okay. ? You may need to stay home longer to avoid giving your URI to others. ? You can also wear a face mask and wash your hands often to prevent spread of the virus.  Use your inhaler more if you have asthma.  Do not use any tobacco products, including cigarettes, chewing tobacco, or electronic cigarettes. If you need help quitting, ask your doctor. Contact a doctor if:  You are getting worse, not better.  Your symptoms are not helped by medicine.  You have chills.  You are getting more short of breath.  You have brown or red mucus.  You have yellow or brown discharge from your nose.  You have pain in your face, especially when you bend forward.  You have a fever.  You have puffy (swollen) neck glands.  You have pain while  swallowing.  You have white areas in the back of your throat. Get help right away if:  You have very bad or constant: ? Headache. ? Ear pain. ? Pain in your forehead, behind your eyes, and over your cheekbones (sinus pain). ? Chest pain.  You have long-lasting (chronic) lung disease and any of the following: ? Wheezing. ? Long-lasting cough. ? Coughing up blood. ? A change in your usual mucus.  You have a stiff neck.  You have changes in your: ? Vision. ? Hearing. ? Thinking. ? Mood. This information is not intended to replace advice given to you by your health care provider. Make sure you discuss any questions you have with your health care provider. Document Released: 07/12/2007 Document Revised: 09/26/2015 Document Reviewed: 04/30/2013 Elsevier Interactive Patient Education  2018 [employer].

## 2017-04-20 ENCOUNTER — Telehealth: Payer: Self-pay | Admitting: *Deleted

## 2017-04-20 ENCOUNTER — Encounter: Payer: Self-pay | Admitting: Physician Assistant

## 2017-04-20 MED ORDER — HYDROCOD POLST-CPM POLST ER 10-8 MG/5ML PO SUER: 5.0000 mL | Freq: Two times a day (BID) | ORAL | 0 refills | Status: DC | PRN

## 2017-04-20 NOTE — Addendum Note (Signed)
Addended by: Brunetta Jeans on: 04/20/2017 01:27 PM   Modules accepted: Orders

## 2017-04-20 NOTE — Telephone Encounter (Signed)
Copied from [place] [id number]. Topic: General - Other >> Apr 20, 2017 11:32 AM Yvette Rack wrote: Reason for CRM:   Timmothy Sours from [facility] to let provider know that they don't have the chlorpheniramine-HYDROcodone [facility]) 10-8 MG/5ML SURE   in stock and if they can resend this to the [facility] on [address] they have it in stock

## 2017-04-20 NOTE — Telephone Encounter (Signed)
Rx has been sent to the requested pharmacy.

## 2017-05-01 ENCOUNTER — Other Ambulatory Visit: Payer: Self-pay | Admitting: Family Medicine

## 2017-05-01 NOTE — Telephone Encounter (Signed)
Last OV 04/17/17 Alprazolam last filled 02/09/17 #30 with 3

## 2017-05-17 ENCOUNTER — Encounter: Payer: Self-pay | Admitting: Physician Assistant

## 2017-05-17 MED ORDER — BENZONATATE 100 MG PO CAPS: 100.0000 mg | ORAL_CAPSULE | Freq: Two times a day (BID) | ORAL | 0 refills | Status: DC | PRN

## 2017-05-22 ENCOUNTER — Encounter: Payer: Self-pay | Admitting: Family Medicine

## 2017-05-31 ENCOUNTER — Encounter: Payer: Self-pay | Admitting: Family Medicine

## 2017-05-31 ENCOUNTER — Telehealth: Payer: Self-pay | Admitting: Pharmacist

## 2017-05-31 ENCOUNTER — Telehealth: Payer: Self-pay | Admitting: Cardiovascular Disease

## 2017-05-31 DIAGNOSIS — E782 Mixed hyperlipidemia: Secondary | ICD-10-CM

## 2017-05-31 NOTE — Telephone Encounter (Signed)
LMOM for pt. Received request for reauthorization for Praluent coverage. Most recent lipid panel from October 2018 indicates patient stopped her PCSK9i therapy since her LDL returned to baseline. Pt needs new lipid panel drawn on therapy for reauthorization.

## 2017-05-31 NOTE — Telephone Encounter (Signed)
New message   Patient returning your call regarding lipids. Please call

## 2017-05-31 NOTE — Telephone Encounter (Signed)
Pt will come to clinic for fasting lipid panel tomorrow. She reports adherence to her Praluent injections. Updated lab work required for [employer] with [facility].

## 2017-06-01 ENCOUNTER — Other Ambulatory Visit: Payer: 59 | Admitting: *Deleted

## 2017-06-01 ENCOUNTER — Ambulatory Visit: Payer: 59 | Admitting: Physician Assistant

## 2017-06-01 DIAGNOSIS — E782 Mixed hyperlipidemia: Secondary | ICD-10-CM | POA: Diagnosis not present

## 2017-06-01 DIAGNOSIS — Z0289 Encounter for other administrative examinations: Secondary | ICD-10-CM

## 2017-06-01 LAB — LIPID PANEL
Chol/HDL Ratio: 3.2 ratio (ref 0.0–4.4)
Cholesterol, Total: 123 mg/dL (ref 100–199)
HDL: 38 mg/dL — ABNORMAL LOW (ref >39)
LDL Calculated: 67 mg/dL (ref 0–99)
Triglycerides: 90 mg/dL (ref 0–149)
VLDL Cholesterol Cal: 18 mg/dL (ref 5–40)

## 2017-06-01 LAB — HEPATIC FUNCTION PANEL
ALT: 15 IU/L (ref 0–32)
AST: 16 IU/L (ref 0–40)
Albumin: 4.3 g/dL (ref 3.5–5.5)
Alkaline Phosphatase: 89 IU/L (ref 39–117)
Bilirubin Total: 0.4 mg/dL (ref 0.0–1.2)
Bilirubin, Direct: 0.14 mg/dL (ref 0.00–0.40)
Total Protein: 6.4 g/dL (ref 6.0–8.5)

## 2017-07-05 ENCOUNTER — Other Ambulatory Visit: Payer: Self-pay | Admitting: Family Medicine

## 2017-07-12 ENCOUNTER — Other Ambulatory Visit: Payer: Self-pay

## 2017-07-12 ENCOUNTER — Ambulatory Visit (INDEPENDENT_AMBULATORY_CARE_PROVIDER_SITE_OTHER): Payer: 59 | Admitting: Family Medicine

## 2017-07-12 ENCOUNTER — Encounter: Payer: Self-pay | Admitting: Family Medicine

## 2017-07-12 VITALS — BP 122/84 | HR 90 | Temp 98.1°F | Resp 16 | Ht 62.0 in | Wt 196.6 lb

## 2017-07-12 DIAGNOSIS — F5101 Primary insomnia: Secondary | ICD-10-CM | POA: Diagnosis not present

## 2017-07-12 DIAGNOSIS — M47816 Spondylosis without myelopathy or radiculopathy, lumbar region: Secondary | ICD-10-CM

## 2017-07-12 MED ORDER — DICLOFENAC SODIUM 75 MG PO TBEC: 75.0000 mg | DELAYED_RELEASE_TABLET | Freq: Two times a day (BID) | ORAL | 0 refills | Status: DC

## 2017-07-12 MED ORDER — TRAMADOL HCL 50 MG PO TABS: 50.0000 mg | ORAL_TABLET | Freq: Four times a day (QID) | ORAL | 0 refills | Status: DC | PRN

## 2017-07-12 MED ORDER — CYCLOBENZAPRINE HCL 10 MG PO TABS: 10.0000 mg | ORAL_TABLET | Freq: Every day | ORAL | 0 refills | Status: DC

## 2017-07-12 NOTE — Progress Notes (Signed)
Subjective  CC:  Chief Complaint  Patient presents with  . Back Pain    Low back, switches sides, radiates down both legs, causing nausea, had for a couple of weeks    HPI: Maria Bishop is a 57 y.o. female who presents to the office today to address the problems listed above in the chief complaint. Acute problem visit, PCP not available.  Long h/o intermittent low back pain due DJD: active x 2 weeks for unclear reasons although fell twice this week: down deck stairs and out of car w/o significant injuries. Still with low back pain with prolonged standing and sitting. Difficult at work due to pain. On advil with some relief. Pain radiates to ant/lat thigh. No knee pain. No b/b dysfunction. No weakness.   Also c/o insomnia: takes benadryl nightly 75mg  with xanax on occasion. Stresses and can't unwind. Failed trazadone due to nightmares.    Assessment  1. Spondylosis of lumbar region without myelopathy or radiculopathy   2. Primary insomnia      Plan   djd lumbar flare:  Nsaids, heating pad, exercises, mm relaxer and time. F/u if not improving. No red flags.   Discussed sleep hygiene some. Flexeril for now to help with sleep and mm spasm. Wean off benadryl and f/u with pcp to further address disordered sleep.   Follow up: 2 weeks if needed   No orders of the defined types were placed in this encounter.  Meds ordered this encounter  Medications  . diclofenac (VOLTAREN) 75 MG EC tablet    Sig: Take 1 tablet (75 mg total) by mouth 2 (two) times daily.    Dispense:  30 tablet    Refill:  0  . cyclobenzaprine (FLEXERIL) 10 MG tablet    Sig: Take 1 tablet (10 mg total) by mouth at bedtime.    Dispense:  30 tablet    Refill:  0  . traMADol (ULTRAM) 50 MG tablet    Sig: Take 1 tablet (50 mg total) by mouth every 6 (six) hours as needed for moderate pain.    Dispense:  10 tablet    Refill:  0      I reviewed the patients updated PMH, FH, and SocHx.    Patient Active  Problem List   Diagnosis Date Noted  . Chest pain 01/25/2016  . Lumbar radiculopathy 08/06/2015  . Memory loss 12/02/2014  . History of cervical cancer 12/02/2014  . History of MI (myocardial infarction) 12/02/2014  . Acute bacterial sinusitis 10/29/2014  . Insomnia 08/31/2014  . Esophageal dysphagia 07/28/2013  . MRSA (methicillin resistant Staphylococcus aureus) 07/08/2013  . GERD (gastroesophageal reflux disease) 07/08/2013  . Abdominal pain, epigastric 07/08/2013  . Screening for malignant neoplasm of the cervix 07/19/2012  . Routine general medical examination at a health care facility 07/19/2012  . Depression with anxiety 06/06/2012  . Hyperlipidemia 09/06/2011  . 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 Cervical cancer ([place]) 06/25/2011  . NSTEMI (non-ST elevated myocardial infarction) ([place]) 06/24/2011  . CAD (coronary artery disease) 06/24/2001   Current Meds  Medication Sig  . Alirocumab (PRALUENT) 75 MG/ML SOPN Inject 75 mg every 14 (fourteen) days into the skin.  Marland Kitchen ALPRAZolam (XANAX) 0.5 MG tablet TAKE 1 TABLET BY MOUTH 2 TIMES DAILY AS NEEDED  . aspirin 81 MG tablet Take 81 mg by mouth daily.  Marland Kitchen buPROPion (WELLBUTRIN XL) 150 MG 24 hr tablet TAKE 1 TABLET BY MOUTH EVERY DAY  . fexofenadine (ALLEGRA) 180  MG tablet Take 180 mg by mouth daily. Reported on 06/02/2015  . FLUoxetine (PROZAC) 40 MG capsule TAKE 1 CAPSULE BY MOUTH EVERY DAY  . ibuprofen (ADVIL,MOTRIN) 200 MG tablet Take 800 mg by mouth every 6 (six) hours as needed. For pain  . nitroGLYCERIN (NITROSTAT) 0.4 MG SL tablet Place 1 tablet (0.4 mg total) under the tongue every 5 (five) minutes as needed.  . pantoprazole (PROTONIX) 40 MG tablet TAKE 1 TABLET BY MOUTH EVERY DAY    Allergies: Patient is allergic to shellfish allergy; statins; zetia [ezetimibe]; and mobic [meloxicam]. Family History: Patient family history includes Alzheimer's disease in her maternal uncle; Cancer in her other;  Coronary artery disease in her mother; Diabetes in her other; Hyperlipidemia in her other. Social History:  Patient  reports that she has quit smoking. Her smoking use included cigarettes. She has never used smokeless tobacco. She reports that she drinks alcohol. She reports that she does not use drugs.  Review of Systems: Constitutional: Negative for fever malaise or anorexia Cardiovascular: negative for chest pain Respiratory: negative for SOB or persistent cough Gastrointestinal: negative for abdominal pain  Objective  Vitals: BP 122/84   Pulse 90   Temp 98.1 F (36.7 C) (Oral)   Resp 16   Ht 5\' 2"  (1.575 m)   Wt 196 lb 9.6 oz (89.2 kg)   LMP 07/05/2012   SpO2 97%   BMI 35.96 kg/m  General: no acute distress , A&Ox3, appears comfortable Back Exam - Normal skin, Spine with normal alignment and without deformity.  Mild tenderness to lumbar vertebral process palpation without step off.  Paraspinous muscles are tender and without spasm.   Range of motion is full at neck and lumbosacral regions. Neg SLR bilaterally. Nl LE strength bilatearlly. +2 LE reflexes bilaterally. Nl gait.  Skin:  Warm, no rashes     Commons side effects, risks, benefits, and alternatives for medications and treatment plan prescribed today were discussed, and the patient expressed understanding of the given instructions. Patient is instructed to call or message via MyChart if he/she has any questions or concerns regarding our treatment plan. No barriers to understanding were identified. We discussed Red Flag symptoms and signs in detail. Patient expressed understanding regarding what to do in case of urgent or emergency type symptoms.   Medication list was reconciled, printed and provided to the patient in AVS. Patient instructions and summary information was reviewed with the patient as documented in the AVS. This note was prepared with assistance of Dragon voice recognition software. Occasional wrong-word or  sound-a-like substitutions may have occurred due to the inherent limitations of voice recognition software

## 2017-07-12 NOTE — Patient Instructions (Signed)
Please return in 2 weeks for recheck if needed. Can schedule an appointment to discuss sleep as well.  If you have any questions or concerns, please don't hesitate to send me a message via MyChart or call the office at [phone]. Thank you for visiting with [place] today! It's our pleasure caring for you.  Insomnia Insomnia is a sleep disorder that makes it difficult to fall asleep or to stay asleep. Insomnia can cause tiredness (fatigue), low energy, difficulty concentrating, mood swings, and poor performance at work or school. There are three different ways to classify insomnia:  Difficulty falling asleep.  Difficulty staying asleep.  Waking up too early in the morning.  Any type of insomnia can be long-term (chronic) or short-term (acute). Both are common. Short-term insomnia usually lasts for three months or less. Chronic insomnia occurs at least three times a week for longer than three months. What are the causes? Insomnia may be caused by another condition, situation, or substance, such as:  Anxiety.  Certain medicines.  Gastroesophageal reflux disease (GERD) or other gastrointestinal conditions.  Asthma or other breathing conditions.  Restless legs syndrome, sleep apnea, or other sleep disorders.  Chronic pain.  Menopause. This may include hot flashes.  Stroke.  Abuse of alcohol, tobacco, or illegal drugs.  Depression.  Caffeine.  Neurological disorders, such as Alzheimer disease.  An overactive thyroid (hyperthyroidism).  The cause of insomnia may not be known. What increases the risk? Risk factors for insomnia include:  Gender. Women are more commonly affected than men.  Age. Insomnia is more common as you get older.  Stress. This may involve your professional or personal life.  Income. Insomnia is more common in people with lower income.  Lack of exercise.  Irregular work schedule or night shifts.  Traveling between different time zones.  What are  the signs or symptoms? If you have insomnia, trouble falling asleep or trouble staying asleep is the main symptom. This may lead to other symptoms, such as:  Feeling fatigued.  Feeling nervous about going to sleep.  Not feeling rested in the morning.  Having trouble concentrating.  Feeling irritable, anxious, or depressed.  How is this treated? Treatment for insomnia depends on the cause. If your insomnia is caused by an underlying condition, treatment will focus on addressing the condition. Treatment may also include:  Medicines to help you sleep.  Counseling or therapy.  Lifestyle adjustments.  Follow these instructions at home:  Take medicines only as directed by your health care provider.  Keep regular sleeping and waking hours. Avoid naps.  Keep a sleep diary to help you and your health care provider figure out what could be causing your insomnia. Include: ? When you sleep. ? When you wake up during the night. ? How well you sleep. ? How rested you feel the next day. ? Any side effects of medicines you are taking. ? What you eat and drink.  Make your bedroom a comfortable place where it is easy to fall asleep: ? Put up shades or special blackout curtains to block light from outside. ? Use a white noise machine to block noise. ? Keep the temperature cool.  Exercise regularly as directed by your health care provider. Avoid exercising right before bedtime.  Use relaxation techniques to manage stress. Ask your health care provider to suggest some techniques that may work well for you. These may include: ? Breathing exercises. ? Routines to release muscle tension. ? Visualizing peaceful scenes.  Cut back on alcohol,  caffeinated beverages, and cigarettes, especially close to bedtime. These can disrupt your sleep.  Do not overeat or eat spicy foods right before bedtime. This can lead to digestive discomfort that can make it hard for you to sleep.  Limit screen use  before bedtime. This includes: ? Watching TV. ? Using your smartphone, tablet, and computer.  Stick to a routine. This can help you fall asleep faster. Try to do a quiet activity, brush your teeth, and go to bed at the same time each night.  Get out of bed if you are still awake after 15 minutes of trying to sleep. Keep the lights down, but try reading or doing a quiet activity. When you feel sleepy, go back to bed.  Make sure that you drive carefully. Avoid driving if you feel very sleepy.  Keep all follow-up appointments as directed by your health care provider. This is important. Contact a health care provider if:  You are tired throughout the day or have trouble in your daily routine due to sleepiness.  You continue to have sleep problems or your sleep problems get worse. Get help right away if:  You have serious thoughts about hurting yourself or someone else. This information is not intended to replace advice given to you by your health care provider. Make sure you discuss any questions you have with your health care provider. Document Released: 01/21/2000 Document Revised: 06/25/2015 Document Reviewed: 10/24/2013 Elsevier Interactive Patient Education  2018 [employer].   Back Pain, Adult Many adults have back pain from time to time. Common causes of back pain include:  A strained muscle or ligament.  Wear and tear (degeneration) of the spinal disks.  Arthritis.  A hit to the back.  Back pain can be short-lived (acute) or last a long time (chronic). A physical exam, lab tests, and imaging studies may be done to find the cause of your pain. Follow these instructions at home: Managing pain and stiffness  Take over-the-counter and prescription medicines only as told by your health care provider.  If directed, apply heat to the affected area as often as told by your health care provider. Use the heat source that your health care provider recommends, such as a moist heat  pack or a heating pad. ? Place a towel between your skin and the heat source. ? Leave the heat on for 20-30 minutes. ? Remove the heat if your skin turns bright red. This is especially important if you are unable to feel pain, heat, or cold. You have a greater risk of getting burned.  If directed, apply ice to the injured area: ? Put ice in a plastic bag. ? Place a towel between your skin and the bag. ? Leave the ice on for 20 minutes, 2-3 times a day for the first 2-3 days. Activity  Do not stay in bed. Resting more than 1-2 days can delay your recovery.  Take short walks on even surfaces as soon as you are able. Try to increase the length of time you walk each day.  Do not sit, drive, or stand in one place for more than 30 minutes at a time. Sitting or standing for long periods of time can put stress on your back.  Use proper lifting techniques. When you bend and lift, use positions that put less stress on your back: ? Priddy your knees. ? Keep the load close to your body. ? Avoid twisting.  Exercise regularly as told by your health care provider. Exercising  will help your back heal faster. This also helps prevent back injuries by keeping muscles strong and flexible.  Your health care provider may recommend that you see a physical therapist. This person can help you come up with a safe exercise program. Do any exercises as told by your physical therapist. Lifestyle  Maintain a healthy weight. Extra weight puts stress on your back and makes it difficult to have good posture.  Avoid activities or situations that make you feel anxious or stressed. Learn ways to manage anxiety and stress. One way to manage stress is through exercise. Stress and anxiety increase muscle tension and can make back pain worse. General instructions  Sleep on a firm mattress in a comfortable position. Try lying on your side with your knees slightly bent. If you lie on your back, put a pillow under your  knees.  Follow your treatment plan as told by your health care provider. This may include: ? Cognitive or behavioral therapy. ? Acupuncture or massage therapy. ? Meditation or yoga. Contact a health care provider if:  You have pain that is not relieved with rest or medicine.  You have increasing pain going down into your legs or buttocks.  Your pain does not improve in 2 weeks.  You have pain at night.  You lose weight.  You have a fever or chills. Get help right away if:  You develop new bowel or bladder control problems.  You have unusual weakness or numbness in your arms or legs.  You develop nausea or vomiting.  You develop abdominal pain.  You feel faint. Summary  Many adults have back pain from time to time. A physical exam, lab tests, and imaging studies may be done to find the cause of your pain.  Use proper lifting techniques. When you bend and lift, use positions that put less stress on your back.  Take over-the-counter and prescription medicines and apply heat or ice as directed by your health care provider. This information is not intended to replace advice given to you by your health care provider. Make sure you discuss any questions you have with your health care provider. Document Released: 01/23/2005 Document Revised: 02/28/2016 Document Reviewed: 02/28/2016 Elsevier Interactive Patient Education  [employer].

## 2017-08-15 ENCOUNTER — Ambulatory Visit: Payer: 59 | Admitting: Family Medicine

## 2017-08-24 ENCOUNTER — Other Ambulatory Visit: Payer: Self-pay

## 2017-08-24 ENCOUNTER — Ambulatory Visit (INDEPENDENT_AMBULATORY_CARE_PROVIDER_SITE_OTHER): Payer: 59 | Admitting: Family Medicine

## 2017-08-24 ENCOUNTER — Encounter: Payer: Self-pay | Admitting: Family Medicine

## 2017-08-24 VITALS — BP 122/80 | HR 102 | Temp 98.1°F | Resp 16 | Ht 62.0 in | Wt 195.0 lb

## 2017-08-24 DIAGNOSIS — F5101 Primary insomnia: Secondary | ICD-10-CM | POA: Diagnosis not present

## 2017-08-24 DIAGNOSIS — R8290 Unspecified abnormal findings in urine: Secondary | ICD-10-CM

## 2017-08-24 DIAGNOSIS — F418 Other specified anxiety disorders: Secondary | ICD-10-CM | POA: Diagnosis not present

## 2017-08-24 DIAGNOSIS — Z1231 Encounter for screening mammogram for malignant neoplasm of breast: Secondary | ICD-10-CM

## 2017-08-24 LAB — POCT URINALYSIS DIPSTICK
Bilirubin, UA: NEGATIVE
Blood, UA: NEGATIVE
Glucose, UA: NEGATIVE
Ketones, UA: NEGATIVE
Nitrite, UA: NEGATIVE
Protein, UA: POSITIVE — AB
Spec Grav, UA: 1.015 (ref 1.010–1.025)
Urobilinogen, UA: 0.2 E.U./dL
pH, UA: 6 (ref 5.0–8.0)

## 2017-08-24 MED ORDER — ZOLPIDEM TARTRATE 10 MG PO TABS: 10.0000 mg | ORAL_TABLET | Freq: Every evening | ORAL | 3 refills | Status: DC | PRN

## 2017-08-24 MED ORDER — BUPROPION HCL ER (XL) 300 MG PO TB24: 300.0000 mg | ORAL_TABLET | Freq: Every day | ORAL | 3 refills | Status: DC

## 2017-08-24 NOTE — Patient Instructions (Addendum)
Schedule your complete physical for next available Contact me via phone or MyChart in 3-4 weeks to let me know how the increased dose of Wellbutrin is working Continue the Prozac daily INCREASE Wellbutrin to 300mg  daily- 2 of what you have at home and 1 of the new prescription Start the Ambien for sleep Drink plenty of fluids Call with any questions or concerns Hang in there!!!

## 2017-08-24 NOTE — Progress Notes (Signed)
   Subjective:    Patient ID: Maria Bishop, female    DOB: Jun 30, 1960, 57 y.o.   MRN: [id number]  HPI Depression/anxiety- ongoing issue for pt.  On Wellbutrin 150mg  daily and Prozac 40mg  daily w/ Alprazolam as needed.  Pt doesn't want to be reliant on Xanax and she finds herself taking this more frequently.  Pt is having both home and work stress.  Finds herself more sensitive to smaller issues.  Pt is interested in increasing Wellbutrin- she has done this in the past.  Urine odor- pt reports a sulfur smell w/ a cloudy appearance.  No burning or irritation.  Insomnia- pt reports she is having to take multiple meds to fall asleep but she is not staying asleep.  Bed partner indicates loud snoring but no breathing pauses.  Pt feels the snoring is b/c of the medication.  Taking Xanax, OTC sleep tabs (2-3 nightly).  Trazodone caused nightmares.    Review of Systems For ROS see HPI     Objective:   Physical Exam  Constitutional: She is oriented to person, place, and time. She appears well-developed and well-nourished. No distress.  obese  HENT:  Head: Normocephalic and atraumatic.  Eyes: Pupils are equal, round, and reactive to light. Conjunctivae and EOM are normal.  Neurological: She is alert and oriented to person, place, and time. No cranial nerve deficit. Coordination normal.  Skin: Skin is warm and dry.  Psychiatric: She has a normal mood and affect. Her behavior is normal. Thought content normal.  Vitals reviewed.         Assessment & Plan:  Urine odor- no other sxs of UTI but given presence of leukocytes and odor, will send for cx and tx if needed.  Pt expressed understanding and is in agreement w/ plan.

## 2017-08-24 NOTE — Assessment & Plan Note (Signed)
Deteriorated.  Pt is feeling overwhelmed by even 'little things'.  In the past, we have had to increase her Wellbutrin during stressful times.  We will again increase to 300mg  daily and monitor closely for improvement.

## 2017-08-24 NOTE — Assessment & Plan Note (Signed)
Deteriorated.  Pt is now taking Alprazolam and OTC sleep meds w/o relief.  She has had nightmares from Trazodone in the past.  She has been on Ambien previously w/o difficulty.  Prescription sent.

## 2017-08-27 ENCOUNTER — Encounter: Payer: Self-pay | Admitting: Family Medicine

## 2017-08-27 ENCOUNTER — Other Ambulatory Visit: Payer: Self-pay | Admitting: General Practice

## 2017-08-27 DIAGNOSIS — N390 Urinary tract infection, site not specified: Secondary | ICD-10-CM

## 2017-08-27 DIAGNOSIS — R7989 Other specified abnormal findings of blood chemistry: Secondary | ICD-10-CM

## 2017-08-27 DIAGNOSIS — R945 Abnormal results of liver function studies: Secondary | ICD-10-CM

## 2017-08-27 LAB — URINE CULTURE
MICRO NUMBER:: 90858094
SPECIMEN QUALITY:: ADEQUATE

## 2017-08-27 MED ORDER — CEPHALEXIN 500 MG PO CAPS: 500.0000 mg | ORAL_CAPSULE | Freq: Four times a day (QID) | ORAL | 0 refills | Status: DC

## 2017-08-28 ENCOUNTER — Other Ambulatory Visit (INDEPENDENT_AMBULATORY_CARE_PROVIDER_SITE_OTHER): Payer: 59

## 2017-08-28 DIAGNOSIS — N390 Urinary tract infection, site not specified: Secondary | ICD-10-CM | POA: Diagnosis not present

## 2017-08-28 DIAGNOSIS — R945 Abnormal results of liver function studies: Secondary | ICD-10-CM

## 2017-08-28 DIAGNOSIS — R7989 Other specified abnormal findings of blood chemistry: Secondary | ICD-10-CM

## 2017-08-28 LAB — COMPREHENSIVE METABOLIC PANEL
ALT: 15 U/L (ref 0–35)
AST: 15 U/L (ref 0–37)
Albumin: 4.4 g/dL (ref 3.5–5.2)
Alkaline Phosphatase: 84 U/L (ref 39–117)
BUN: 12 mg/dL (ref 6–23)
CO2: 27 mEq/L (ref 19–32)
Calcium: 9.2 mg/dL (ref 8.4–10.5)
Chloride: 103 mEq/L (ref 96–112)
Creatinine, Ser: 0.85 mg/dL (ref 0.40–1.20)
GFR: 73.87 mL/min (ref >60.00)
Glucose, Bld: 107 mg/dL — ABNORMAL HIGH (ref 70–99)
Potassium: 3.7 mEq/L (ref 3.5–5.1)
Sodium: 138 mEq/L (ref 135–145)
Total Bilirubin: 0.7 mg/dL (ref 0.2–1.2)
Total Protein: 6.8 g/dL (ref 6.0–8.3)

## 2017-08-29 ENCOUNTER — Other Ambulatory Visit (INDEPENDENT_AMBULATORY_CARE_PROVIDER_SITE_OTHER): Payer: 59

## 2017-08-29 DIAGNOSIS — R7309 Other abnormal glucose: Secondary | ICD-10-CM | POA: Diagnosis not present

## 2017-08-29 LAB — HEMOGLOBIN A1C: Hgb A1c MFr Bld: 5 % (ref 4.6–6.5)

## 2017-08-30 ENCOUNTER — Other Ambulatory Visit: Payer: Self-pay | Admitting: Family Medicine

## 2017-08-30 NOTE — Telephone Encounter (Signed)
Last OV 08/24/17 Alprazolam last filled 05/01/17 #30 with 3

## 2017-10-11 ENCOUNTER — Ambulatory Visit
Admission: RE | Admit: 2017-10-11 | Discharge: 2017-10-11 | Disposition: A | Discharge status: Home / Self Care | Payer: 59 | Source: Ambulatory Visit | Attending: Family Medicine | Admitting: Family Medicine

## 2017-10-11 DIAGNOSIS — Z1231 Encounter for screening mammogram for malignant neoplasm of breast: Secondary | ICD-10-CM

## 2017-10-23 ENCOUNTER — Other Ambulatory Visit: Payer: Self-pay | Admitting: Family Medicine

## 2017-11-08 ENCOUNTER — Other Ambulatory Visit: Payer: Self-pay | Admitting: Pharmacist

## 2017-11-08 MED ORDER — ALIROCUMAB 75 MG/ML ~~LOC~~ SOPN: 75.0000 mg | PEN_INJECTOR | SUBCUTANEOUS | 11 refills | Status: DC

## 2017-11-20 ENCOUNTER — Other Ambulatory Visit: Payer: Self-pay | Admitting: Family Medicine

## 2017-11-20 NOTE — Telephone Encounter (Signed)
Last Filled: 08/29/17 #30, 2 Last OV: 08/24/17

## 2017-11-20 NOTE — Telephone Encounter (Signed)
Last rx for Ambien 08/24/17 #30 3 RF Last OV 08/24/17 Next appt 01/18/18

## 2017-11-27 ENCOUNTER — Other Ambulatory Visit: Payer: Self-pay | Admitting: Family Medicine

## 2017-11-29 ENCOUNTER — Encounter: Payer: Self-pay | Admitting: Family Medicine

## 2017-11-29 ENCOUNTER — Ambulatory Visit (INDEPENDENT_AMBULATORY_CARE_PROVIDER_SITE_OTHER): Payer: 59 | Admitting: Family Medicine

## 2017-11-29 ENCOUNTER — Other Ambulatory Visit: Payer: Self-pay

## 2017-11-29 VITALS — BP 128/84 | HR 96 | Temp 98.2°F | Ht 62.0 in

## 2017-11-29 DIAGNOSIS — M47816 Spondylosis without myelopathy or radiculopathy, lumbar region: Secondary | ICD-10-CM

## 2017-11-29 DIAGNOSIS — G5602 Carpal tunnel syndrome, left upper limb: Secondary | ICD-10-CM | POA: Diagnosis not present

## 2017-11-29 MED ORDER — TRAMADOL HCL 50 MG PO TABS: 50.0000 mg | ORAL_TABLET | Freq: Three times a day (TID) | ORAL | 0 refills | Status: DC | PRN

## 2017-11-29 MED ORDER — CYCLOBENZAPRINE HCL 10 MG PO TABS: 10.0000 mg | ORAL_TABLET | Freq: Three times a day (TID) | ORAL | 0 refills | Status: DC | PRN

## 2017-11-29 NOTE — Patient Instructions (Signed)
Please follow up if symptoms do not improve or as needed.   

## 2017-11-29 NOTE — Progress Notes (Signed)
Subjective  CC:  Chief Complaint  Patient presents with  . Back Pain    lower back pain, area is swollen and she can see red blood vessels, declines flu shot  . Hand Pain    bilateral hand pain, tingling in her hands ocassionally     HPI: Maria Bishop is a 57 y.o. female who presents to the office today to address the problems listed above in the chief complaint.  C/o low back pain worsening over this week. Coworkers were concerned about possibility of shingles. Has h/o DJD lumbar spine. No radicular sxs. No injury. Has used nsaids w/o complete relief. No b/b dysfunction. No fevers   Left hand tingles at night w/o weakness or neck pain  Assessment  1. Spondylosis of lumbar region without myelopathy or radiculopathy   2. Carpal tunnel syndrome of left wrist      Plan   Back pain:  MSK; no sign of shingles rash. Reassured start warm heat, stretching, nsaids and mm relaxers. F/u if not improving  Possible CTS: start with wrist splint at night and nsaids.   Follow up: f/u prn   No orders of the defined types were placed in this encounter.  Meds ordered this encounter  Medications  . cyclobenzaprine (FLEXERIL) 10 MG tablet    Sig: Take 1 tablet (10 mg total) by mouth 3 (three) times daily as needed for muscle spasms.    Dispense:  30 tablet    Refill:  0  . traMADol (ULTRAM) 50 MG tablet    Sig: Take 1 tablet (50 mg total) by mouth every 8 (eight) hours as needed.    Dispense:  30 tablet    Refill:  0      I reviewed the patients updated PMH, FH, and SocHx.    Patient Active Problem List   Diagnosis Date Noted  . Chest pain 01/25/2016  . Lumbar radiculopathy 08/06/2015  . Memory loss 12/02/2014  . History of cervical cancer 12/02/2014  . History of MI (myocardial infarction) 12/02/2014  . Acute bacterial sinusitis 10/29/2014  . Insomnia 08/31/2014  . Esophageal dysphagia 07/28/2013  . GERD (gastroesophageal reflux disease) 07/08/2013  . Abdominal pain,  epigastric 07/08/2013  . Screening for malignant neoplasm of the cervix 07/19/2012  . Routine general medical examination at a health care facility 07/19/2012  . Depression with anxiety 06/06/2012  . Hyperlipidemia 09/06/2011  . 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 NSTEMI (non-ST elevated myocardial infarction) ([place]) 06/24/2011  . CAD (coronary artery disease) 06/24/2001   Current Meds  Medication Sig  . Alirocumab (PRALUENT) 75 MG/ML SOPN Inject 75 mg into the skin every 14 (fourteen) days.  . ALPRAZolam (XANAX) 0.5 MG tablet TAKE 1 TABLET BY MOUTH 2 TIMES DAILY AS NEEDED  . aspirin 81 MG tablet Take 81 mg by mouth daily.  Marland Kitchen buPROPion (WELLBUTRIN XL) 300 MG 24 hr tablet TAKE 1 TABLET BY MOUTH EVERY DAY  . fexofenadine (ALLEGRA) 180 MG tablet Take 180 mg by mouth daily. Reported on 06/02/2015  . FLUoxetine (PROZAC) 40 MG capsule TAKE 1 CAPSULE BY MOUTH EVERY DAY  . pantoprazole (PROTONIX) 40 MG tablet TAKE 1 TABLET BY MOUTH EVERY DAY  . zolpidem (AMBIEN) 10 MG tablet TAKE 1 TABLET BY MOUTH AT BEDTIME AS NEEDED FOR SLEEP    Allergies: Patient is allergic to shellfish allergy; statins; zetia [ezetimibe]; and mobic [meloxicam]. Family History: Patient family history includes Alzheimer's disease in her maternal uncle; Cancer in her other;  Coronary artery disease in her mother; Diabetes in her other; Hyperlipidemia in her other. Social History:  Patient  reports that she has quit smoking. Her smoking use included cigarettes. She has never used smokeless tobacco. She reports that she drinks alcohol. She reports that she does not use drugs.  Review of Systems: Constitutional: Negative for fever malaise or anorexia Cardiovascular: negative for chest pain Respiratory: negative for SOB or persistent cough Gastrointestinal: negative for abdominal pain  Objective  Vitals: BP 128/84   Pulse 96   Temp 98.2 F (36.8 C)   Ht 5\' 2"  (1.575 m)   LMP 07/05/2012   SpO2  98%   BMI 35.67 kg/m  General: no acute distress , A&Ox3 Skin:  Warm, no rashes BACK: FROM, pain in low back with extension and lateral flexion to left. No rash, + paravertebral mm ttp w/o spasm. Neg SLR Bilaterally. Nl reflexes and LE strength.      Commons side effects, risks, benefits, and alternatives for medications and treatment plan prescribed today were discussed, and the patient expressed understanding of the given instructions. Patient is instructed to call or message via MyChart if he/she has any questions or concerns regarding our treatment plan. No barriers to understanding were identified. We discussed Red Flag symptoms and signs in detail. Patient expressed understanding regarding what to do in case of urgent or emergency type symptoms.   Medication list was reconciled, printed and provided to the patient in AVS. Patient instructions and summary information was reviewed with the patient as documented in the AVS. This note was prepared with assistance of Dragon voice recognition software. Occasional wrong-word or sound-a-like substitutions may have occurred due to the inherent limitations of voice recognition software

## 2017-12-01 ENCOUNTER — Encounter: Payer: Self-pay | Admitting: Family Medicine

## 2017-12-18 ENCOUNTER — Encounter: Payer: Self-pay | Admitting: Family Medicine

## 2018-01-06 ENCOUNTER — Other Ambulatory Visit: Payer: Self-pay | Admitting: Family Medicine

## 2018-01-07 MED ORDER — PANTOPRAZOLE SODIUM 40 MG PO TBEC: 40.0000 mg | DELAYED_RELEASE_TABLET | Freq: Every day | ORAL | 0 refills | Status: DC

## 2018-01-07 MED ORDER — CYCLOBENZAPRINE HCL 10 MG PO TABS: 10.0000 mg | ORAL_TABLET | Freq: Three times a day (TID) | ORAL | 0 refills | Status: DC | PRN

## 2018-01-07 MED ORDER — TRAMADOL HCL 50 MG PO TABS: 50.0000 mg | ORAL_TABLET | Freq: Three times a day (TID) | ORAL | 0 refills | Status: DC | PRN

## 2018-01-07 NOTE — Telephone Encounter (Signed)
Last OV 11/29/17 Tramadol last filled 11/29/17 #30 with 0 Flexeril last filled 11/29/17 #30 with 0

## 2018-01-08 MED ORDER — TRAMADOL HCL 50 MG PO TABS: 50.0000 mg | ORAL_TABLET | Freq: Three times a day (TID) | ORAL | 0 refills | Status: DC | PRN

## 2018-01-08 NOTE — Addendum Note (Signed)
Addended by: Davis Gourd on: 01/08/2018 08:21 AM   Modules accepted: Orders

## 2018-01-18 ENCOUNTER — Encounter: Payer: 59 | Admitting: Family Medicine

## 2018-01-24 ENCOUNTER — Encounter: Payer: Self-pay | Admitting: Family Medicine

## 2018-01-24 ENCOUNTER — Other Ambulatory Visit: Payer: Self-pay

## 2018-01-24 ENCOUNTER — Other Ambulatory Visit (HOSPITAL_COMMUNITY)
Admission: RE | Admit: 2018-01-24 | Discharge: 2018-01-24 | Disposition: A | Discharge status: Home / Self Care | Payer: 59 | Source: Ambulatory Visit | Attending: Family Medicine | Admitting: Family Medicine

## 2018-01-24 ENCOUNTER — Ambulatory Visit (INDEPENDENT_AMBULATORY_CARE_PROVIDER_SITE_OTHER): Payer: 59 | Admitting: Family Medicine

## 2018-01-24 VITALS — BP 112/82 | HR 78 | Temp 98.1°F | Resp 17 | Ht 62.0 in | Wt 194.5 lb

## 2018-01-24 DIAGNOSIS — M2550 Pain in unspecified joint: Secondary | ICD-10-CM

## 2018-01-24 DIAGNOSIS — Z124 Encounter for screening for malignant neoplasm of cervix: Secondary | ICD-10-CM

## 2018-01-24 DIAGNOSIS — E785 Hyperlipidemia, unspecified: Secondary | ICD-10-CM

## 2018-01-24 DIAGNOSIS — Z23 Encounter for immunization: Secondary | ICD-10-CM

## 2018-01-24 DIAGNOSIS — E559 Vitamin D deficiency, unspecified: Secondary | ICD-10-CM | POA: Insufficient documentation

## 2018-01-24 DIAGNOSIS — H60501 Unspecified acute noninfective otitis externa, right ear: Secondary | ICD-10-CM

## 2018-01-24 DIAGNOSIS — Z0000 Encounter for general adult medical examination without abnormal findings: Secondary | ICD-10-CM

## 2018-01-24 LAB — BASIC METABOLIC PANEL
BUN: 16 mg/dL (ref 6–23)
CO2: 27 mEq/L (ref 19–32)
Calcium: 9.7 mg/dL (ref 8.4–10.5)
Chloride: 103 mEq/L (ref 96–112)
Creatinine, Ser: 0.72 mg/dL (ref 0.40–1.20)
GFR: 89.34 mL/min (ref >60.00)
Glucose, Bld: 88 mg/dL (ref 70–99)
Potassium: 3.9 mEq/L (ref 3.5–5.1)
Sodium: 138 mEq/L (ref 135–145)

## 2018-01-24 LAB — CBC WITH DIFFERENTIAL/PLATELET
Basophils Absolute: 0 10*3/uL (ref 0.0–0.1)
Basophils Relative: 0.2 % (ref 0.0–3.0)
Eosinophils Absolute: 0.1 10*3/uL (ref 0.0–0.7)
Eosinophils Relative: 2.6 % (ref 0.0–5.0)
HCT: 40.4 % (ref 36.0–46.0)
Hemoglobin: 14.2 g/dL (ref 12.0–15.0)
Lymphocytes Relative: 25.6 % (ref 12.0–46.0)
Lymphs Abs: 1.2 10*3/uL (ref 0.7–4.0)
MCHC: 35.1 g/dL (ref 30.0–36.0)
MCV: 91 fl (ref 78.0–100.0)
Monocytes Absolute: 0.3 10*3/uL (ref 0.1–1.0)
Monocytes Relative: 7 % (ref 3.0–12.0)
Neutro Abs: 3.1 10*3/uL (ref 1.4–7.7)
Neutrophils Relative %: 64.6 % (ref 43.0–77.0)
Platelets: 283 10*3/uL (ref 150.0–400.0)
RBC: 4.44 Mil/uL (ref 3.87–5.11)
RDW: 13.3 % (ref 11.5–15.5)
WBC: 4.8 10*3/uL (ref 4.0–10.5)

## 2018-01-24 LAB — HEPATIC FUNCTION PANEL
ALT: 13 U/L (ref 0–35)
AST: 16 U/L (ref 0–37)
Albumin: 4.6 g/dL (ref 3.5–5.2)
Alkaline Phosphatase: 72 U/L (ref 39–117)
Bilirubin, Direct: 0.1 mg/dL (ref 0.0–0.3)
Total Bilirubin: 0.5 mg/dL (ref 0.2–1.2)
Total Protein: 7.3 g/dL (ref 6.0–8.3)

## 2018-01-24 LAB — LIPID PANEL
Cholesterol: 115 mg/dL (ref 0–200)
HDL: 39.5 mg/dL (ref >39.00)
LDL Cholesterol: 45 mg/dL (ref 0–99)
NonHDL: 75.95
Total CHOL/HDL Ratio: 3
Triglycerides: 155 mg/dL — ABNORMAL HIGH (ref 0.0–149.0)
VLDL: 31 mg/dL (ref 0.0–40.0)

## 2018-01-24 LAB — TSH: TSH: 1.86 u[IU]/mL (ref 0.35–4.50)

## 2018-01-24 LAB — VITAMIN D 25 HYDROXY (VIT D DEFICIENCY, FRACTURES): VITD: 26.26 ng/mL — ABNORMAL LOW (ref 30.00–100.00)

## 2018-01-24 MED ORDER — NEOMYCIN-POLYMYXIN-HC 3.5-10000-1 OT SUSP: 3.0000 [drp] | Freq: Three times a day (TID) | OTIC | 0 refills | Status: DC

## 2018-01-24 NOTE — Patient Instructions (Addendum)
Follow up in 6 months to recheck cholesterol We'll notify you of your lab results and make any changes if needed Continue to work on healthy diet and regular exercise- you can do it! Make sure you have a stress outlet!  You deserve it! Take Fish Oil, Vit D, and Turmeric for joint pains- this can be in addition to tylenol Use the ear drops as directed for swimmer's ear Call with any questions or concerns Happy Holidays!!!

## 2018-01-24 NOTE — Progress Notes (Signed)
   Subjective:    Patient ID: Maria Bishop, female    DOB: May 30, 1960, 57 y.o.   MRN: [id number]  HPI CPE- UTD on mammo, due for pap.  Due for flu.  UTD on Tdap.   Review of Systems Patient reports no vision/ hearing changes, adenopathy,fever, weight change,  persistant/recurrent hoarseness , swallowing issues, chest pain, palpitations, edema, persistant/recurrent cough, hemoptysis, dyspnea (rest/exertional/paroxysmal nocturnal), gastrointestinal bleeding (melena, rectal bleeding), abdominal pain, significant heartburn, bowel changes, GU symptoms (dysuria, hematuria, incontinence), Gyn symptoms (abnormal  bleeding, pain),  syncope, focal weakness, memory loss, numbness & tingling, skin/hair/nail changes, abnormal bruising or bleeding, anxiety, or depression.  + joint pains- pain in hands bilaterally, pain in back.  At times unable to use her hands to do her job.  Will take ibuprofen for pain and will use Flexeril/Tramadol as needed  + R ear pain- pain w/ movement of outer ear.  No drainage.  No fevers.    Objective:   Physical Exam  General Appearance:    Alert, cooperative, no distress, appears stated age  Head:    Normocephalic, without obvious abnormality, atraumatic  Eyes:    PERRL, conjunctiva/corneas clear, EOM's intact, fundi    benign, both eyes  Ears:    TMs WNL bilaterally, R EAC swollen and debris noted- consistent w/ otitis externa  Nose:   Nares normal, septum midline, mucosa normal, no drainage    or sinus tenderness  Throat:   Lips, mucosa, and tongue normal; teeth and gums normal  Neck:   Supple, symmetrical, trachea midline, no adenopathy;    Thyroid: no enlargement/tenderness/nodules  Back:     Symmetric, no curvature, ROM normal, no CVA tenderness  Lungs:     Clear to auscultation bilaterally, respirations unlabored  Chest Wall:    No tenderness or deformity   Heart:    Regular rate and rhythm, S1 and S2 normal, no murmur, rub   or gallop  Breast Exam:    No  tenderness, masses, or nipple abnormality  Abdomen:     Soft, non-tender, bowel sounds active all four quadrants,    no masses, no organomegaly  Genitalia:    External genitalia normal, cervix normal in appearance, no CMT, uterus in normal size and position, adnexa w/out mass or tenderness, mucosa pink and moist, no lesions or discharge present  Rectal:    Normal external appearance  Extremities:   Extremities normal, atraumatic, no cyanosis or edema  Pulses:   2+ and symmetric all extremities  Skin:   Skin color, texture, turgor normal, no rashes or lesions  Lymph nodes:   Cervical, supraclavicular, and axillary nodes normal  Neurologic:   CNII-XII intact, normal strength, sensation and reflexes    throughout          Assessment & Plan:  R Otitis Externa- new.  Start drop as directed.  Pt expressed understanding and is in agreement w/ plan.   Polyarthralgia- new.  Pt's described sxs are symmetric and severe.  Start Fish Oil, Vit D and Turmeric for joint pain while running rheumatologic labs to assess for underlying process.  If +, will refer to Rheum.  Pt expressed understanding and is in agreement w/ plan.

## 2018-01-25 ENCOUNTER — Other Ambulatory Visit: Payer: Self-pay | Admitting: General Practice

## 2018-01-25 MED ORDER — VITAMIN D (ERGOCALCIFEROL) 1.25 MG (50000 UNIT) PO CAPS: 50000.0000 [IU] | ORAL_CAPSULE | ORAL | 0 refills | Status: DC

## 2018-01-28 ENCOUNTER — Other Ambulatory Visit: Payer: Self-pay | Admitting: Family Medicine

## 2018-01-28 DIAGNOSIS — R768 Other specified abnormal immunological findings in serum: Secondary | ICD-10-CM

## 2018-01-28 LAB — CYTOLOGY - PAP
Diagnosis: NEGATIVE
HPV: NOT DETECTED

## 2018-01-28 LAB — ANTI-NUCLEAR AB-TITER (ANA TITER): ANA Titer 1: 1:320 {titer} — ABNORMAL HIGH

## 2018-01-28 LAB — ANA: Anti Nuclear Antibody(ANA): POSITIVE — AB

## 2018-01-28 LAB — RHEUMATOID FACTOR: Rheumatoid fact SerPl-aCnc: <14 IU/mL (ref <14)

## 2018-02-04 ENCOUNTER — Other Ambulatory Visit: Payer: Self-pay | Admitting: Family Medicine

## 2018-02-05 ENCOUNTER — Encounter: Payer: Self-pay | Admitting: Family Medicine

## 2018-02-05 NOTE — Assessment & Plan Note (Signed)
Chronic problem.  Check labs.  Adjust meds prn  

## 2018-02-05 NOTE — Assessment & Plan Note (Signed)
Pap collected. 

## 2018-02-05 NOTE — Assessment & Plan Note (Signed)
Pt's PE WNL w/ exception of R OE.  UTD on mammo, pap done today.  Flu shot given.  Check labs.  Anticipatory guidance provided.

## 2018-02-05 NOTE — Assessment & Plan Note (Signed)
Pt w/ hx of this.  Check labs and replete prn.

## 2018-02-08 ENCOUNTER — Other Ambulatory Visit: Payer: Self-pay | Admitting: Family Medicine

## 2018-02-08 MED ORDER — TRAMADOL HCL 50 MG PO TABS: 50.0000 mg | ORAL_TABLET | Freq: Three times a day (TID) | ORAL | 0 refills | Status: DC | PRN

## 2018-02-08 MED ORDER — CYCLOBENZAPRINE HCL 10 MG PO TABS: 10.0000 mg | ORAL_TABLET | Freq: Three times a day (TID) | ORAL | 0 refills | Status: DC | PRN

## 2018-02-08 NOTE — Telephone Encounter (Signed)
Last OV 01/24/18 Flexeril last filled 01/07/18 #30 with 0 Tramadol last filled 01/08/18 #30 with 0

## 2018-02-26 NOTE — Progress Notes (Signed)
Office Visit Note  Patient: Maria Bishop             Date of Birth: [birth year]           MRN: [id number]             PCP: [name], MD Referring: [name], MD Visit Date: 03/12/2018 Occupation: Optometry technician  Subjective:  Pain in multiple joints.   History of Present Illness: Maria Bishop is a 58 y.o. female in consultation per request of her PCP.  According to patient she has had lower back pain and knee joint pain for many years.  She recalls seeing an orthopedic surgeon several years ago who did x-ray of the lumbar spine and advised surgery.  She did not go for surgery and has been taking anti-inflammatories and using heating pad.  She states over the last 1 year she has been experiencing increased pain in her hands and decreased grip strength.  She also feels that her right thumb gets locked at times.  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  She has been experiencing pain in her bilateral thumb and also tingling in her hands.  She states her lower back pain persists as well.  She was recently prescribed tramadol and Flexeril which was helpful to some extent.  She states she has difficulty sitting on the stool for long time due to lower back pain.  She has been also experiencing some neck pain for the last 1 week for which she has used some over-the-counter topical agents and it has improved to some extent.  She has noticed some swelling in her knee joints in her hands.  Activities of Daily Living:  Patient reports morning stiffness for 0 minute.   Patient Reports nocturnal pain.  Difficulty dressing/grooming: Denies Difficulty climbing stairs: Reports Difficulty getting out of chair: Reports Difficulty using hands for taps, buttons, cutlery, and/or writing: Reports  Review of Systems  Constitutional: Positive for fatigue. Negative for night sweats, weight gain and weight loss.  HENT: Positive for mouth  sores and mouth dryness. Negative for trouble swallowing, trouble swallowing and nose dryness.        +NU  Eyes: Negative for pain, redness, visual disturbance and dryness.  Respiratory: Negative for cough, shortness of breath and difficulty breathing.   Cardiovascular: Positive for hypertension. Negative for chest pain, palpitations, irregular heartbeat and swelling in legs/feet.  Gastrointestinal: Positive for constipation. Negative for blood in stool and diarrhea.  Endocrine: Negative for increased urination.  Genitourinary: Negative for vaginal dryness.  Musculoskeletal: Positive for arthralgias, joint pain and joint swelling. Negative for myalgias, muscle weakness, morning stiffness, muscle tenderness and myalgias.  Skin: Positive for hair loss and sensitivity to sunlight. Negative for color change, rash, skin tightness and ulcers.  Allergic/Immunologic: Negative for susceptible to infections.  Neurological: Negative for dizziness, memory loss, night sweats and weakness.  Hematological: Negative for swollen glands.  Psychiatric/Behavioral: Positive for depressed mood. Negative for sleep disturbance. The patient is nervous/anxious.     PMFS History:  Patient Active Problem List   Diagnosis Date Noted  . Vitamin D deficiency 01/24/2018  . Chest pain 01/25/2016  . Lumbar radiculopathy 08/06/2015  . Memory loss 12/02/2014  . History of cervical cancer 12/02/2014  . History of MI (myocardial infarction) 12/02/2014  . Acute bacterial sinusitis 10/29/2014  . Insomnia 08/31/2014  . GERD (gastroesophageal reflux disease) 07/08/2013  . Abdominal pain, epigastric 07/08/2013  .  Screening for malignant neoplasm of cervix 07/19/2012  . Routine general medical examination at a health care facility 07/19/2012  . Depression with anxiety 06/06/2012  . Hyperlipidemia 09/06/2011  . 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 CAD (coronary artery disease) 06/24/2001    Past  Medical History:  Diagnosis Date  . CAD (coronary artery disease)    a. NSTEMI 2013: likely spontaneous dissection/ruptured plaque in OM3 - PCI: 3 overlapping Promus DES to OM3, EF 55% b. cath 2014: patent stents  . Cervical cancer ([place])   . Degenerative disc disease   . Myocardial infarction ([place])    ~61yrs ago  . NSTEMI (non-ST elevated myocardial infarction) ([facility]) 06/24/2011   a. NSTEMI 2013: likely spontaneous dissection/ruptured plaque in OM3 - PCI: 3 overlapping Promus DES to OM3, EF 55%     Family History  Problem Relation Age of Onset  . Alzheimer's disease Maternal Uncle   . Coronary artery disease Mother        MI in her 54s  . Early death Father   . Brain cancer Sister   . Drug abuse Son   . Asthma Daughter   . Anxiety disorder Daughter    Past Surgical History:  Procedure Laterality Date  . APPENDECTOMY    . [place] bunionectomy right  February 2015  . CARDIAC CATHETERIZATION N/A 01/26/2016   Procedure: Left Heart Cath and Coronary Angiography;  Surgeon: Belva Crome, MD;  Location: [facility];  Service: Cardiovascular;  Laterality: N/A;  . LEFT HEART CATHETERIZATION WITH CORONARY ANGIOGRAM N/A 06/24/2011   Procedure: LEFT HEART CATHETERIZATION WITH CORONARY ANGIOGRAM;  Surgeon: Jettie Booze, MD;  Location[facility] C[facility];  Service: Cardiovascular;  Laterality: N/A;  . PERCUTANEOUS CORONARY STENT INTERVENTION (PCI-S) Right 06/24/2011   Procedure: PERCUTANEOUS CORONARY STENT INTERVENTION (PCI-S);  Surgeon: Jettie Booze, MD;  Location: [facility] CATH LAB;  Service: Cardiovascular;  Laterality: Right;  . TUBAL LIGATION     Social History   Social History Narrative  . Not on file   Immunization History  Administered Date(s) Administered  . Influenza,inj,Quad PF,6+ Mos 01/24/2018  . Tdap 06/06/2014     Objective: Vital Signs: BP (!) 134/94 (BP Location: Right Arm, Patient Position: Sitting, Cuff Size: Normal)   Pulse 100   Resp 13   Ht 5\' 2"  (1.575 m)    Wt 198 lb (89.8 kg)   LMP 07/05/2012   BMI 36.21 kg/m    Physical Exam Vitals signs and nursing note reviewed.  Constitutional:      Appearance: She is well-developed.  HENT:     Head: Normocephalic and atraumatic.  Eyes:     Conjunctiva/sclera: Conjunctivae normal.  Neck:     Musculoskeletal: Normal range of motion.  Cardiovascular:     Rate and Rhythm: Normal rate and regular rhythm.     Heart sounds: Normal heart sounds.  Pulmonary:     Effort: Pulmonary effort is normal.     Breath sounds: Normal breath sounds.  Abdominal:     General: Bowel sounds are normal.     Palpations: Abdomen is soft.  Lymphadenopathy:     Cervical: No cervical adenopathy.  Skin:    General: Skin is warm and dry.     Capillary Refill: Capillary refill takes less than 2 seconds.  Neurological:     Mental Status: She is alert and oriented to person, place, and time.  Psychiatric:        Behavior: Behavior normal.  Musculoskeletal Exam: Some discomfort with range of motion of the cervical spine.  She has good range of motion of the lumbar spine with discomfort in the lumbar region.  Shoulder joints elbow joints wrist joints with good range of motion.  She has tenderness over bilateral CMC.  PIP and DIP thickening was noted.  No synovitis was noted.  She has some discomfort range of motion of her right hip joint.  She had discomfort range of motion of bilateral knee joints without any warmth swelling or effusion.  Ankle joints MTPs PIPs with good range of motion with no synovitis.  CDAI Exam: CDAI Score: Not documented Patient Global Assessment: Not documented; Provider Global Assessment: Not documented Swollen: Not documented; Tender: Not documented Joint Exam   Not documented   There is currently no information documented on the homunculus. Go to the Rheumatology activity and complete the homunculus joint exam.  Investigation: Findings:  01/24/18: ANA 1:320 NH, Vitamin D 26.26, RF  <14  Component     Latest Ref Rng & Units 01/24/2018  ANA Titer 1     titer 1:320 (H)  ANA Pattern 1      Nuclear, Homogeneous (A)  VITD     30.00 - 100.00 ng/mL 26.26 (L)  RA Latex Turbid.     <14 IU/mL <14  Anti Nuclear Antibody(ANA)     NEGATIVE POSITIVE (A)   Imaging: Xr Cervical Spine 2 Or 3 Views  Result Date: 03/12/2018 C5-C6 narrowing was noted.  Multilevel mild spondylosis was noted. Impression: These findings are consistent with degenerative disc disease of cervical spine.  Xr Hand 2 View Left  Result Date: 03/12/2018 PIP, DIP and CMC narrowing was noted.  No metacarpal, intercarpal or radiocarpal narrowing was noted.  No erosive changes were noted. Impression: These findings are consistent with osteoarthritis of the hand.  Xr Hand 2 View Right  Result Date: 03/12/2018 PIP DIP and CMC narrowing was noted.  No MCP intercarpal or radiocarpal joint space narrowing was noted.  No erosive changes were noted.  Cyst was noted in the carpal bone. Impression: These findings were consistent with osteoarthritis of the hand.  Xr Knee 3 View Left  Result Date: 03/12/2018 Mild medial compartment narrowing was noted.  Moderate patellofemoral narrowing was noted.  No chondrocalcinosis was noted. Impression: These findings are consistent with mild osteoarthritis and moderate chondromalacia patella.  Xr Knee 3 View Right  Result Date: 03/12/2018 Mild medial compartment narrowing with medial and lateral osteophytes were noted.  Patellofemoral narrowing was noted. Impression: These findings are consistent with mild osteoarthritis and moderate chondromalacia patella.  Xr Lumbar Spine 2-3 Views  Result Date: 03/12/2018 Multilevel spondylosis and facet joint arthropathy was noted.  Significant narrowing was noted between L2-3, L3-4, L4-5.  Mild L4-L5 spondylolisthesis was noted.  Dextroscoliosis was noted.  No SI joint changes were noted. Impression: These findings are consistent with multilevel  spondylosis and facet joint arthropathy.   Recent Labs: Lab Results  Component Value Date   WBC 4.8 01/24/2018   HGB 14.2 01/24/2018   PLT 283.0 01/24/2018   NA 138 01/24/2018   K 3.9 01/24/2018   CL 103 01/24/2018   CO2 27 01/24/2018   GLUCOSE 88 01/24/2018   BUN 16 01/24/2018   CREATININE 0.72 01/24/2018   BILITOT 0.5 01/24/2018   ALKPHOS 72 01/24/2018   AST 16 01/24/2018   ALT 13 01/24/2018   PROT 7.3 01/24/2018   ALBUMIN 4.6 01/24/2018   CALCIUM 9.7 01/24/2018   GFRAA >60  01/26/2016    Speciality Comments: No specialty comments available.  Procedures:  No procedures performed Allergies: Shellfish allergy; Statins; Zetia [ezetimibe]; and Mobic [meloxicam]   Assessment / Plan:     Visit Diagnoses: Positive ANA (antinuclear antibody) -patient had lab work done by PCP for arthralgias and ANA is positive.  She gives history of intermittent joint swelling and also gives history of dry mouth dry eyes.  She gives history of photosensitivity and hair loss.  She also gives history of oral ulcers and nasal ulcers.  I will obtain following labs today.  01/24/18: ANA 1:320 NH, Vitamin D 26.26, RF <14 - Plan: Urinalysis, Routine w reflex microscopic, CK, Sedimentation rate, Anti-scleroderma antibody, RNP Antibody, Anti-Smith antibody, Sjogrens syndrome-A extractable nuclear antibody, Sjogrens syndrome-B extractable nuclear antibody, Anti-DNA antibody, double-stranded, C3 and C4, Beta-2 glycoprotein antibodies, Cardiolipin antibodies, IgG, IgM, IgA, Lupus Anticoagulant Eval w/Reflex, Glucose 6 phosphate dehydrogenase  Pain in both hands -no synovitis on examination.  Clinical findings are consistent with osteoarthritis.  Plan: XR Hand 2 View Right, XR Hand 2 View Left, x-rays were consistent with osteoarthritis of bilateral hands.  Note on hand exercises was given.  Cyclic citrul peptide antibody, IgG  Chronic pain of both knees -she has discomfort getting up from the chair and also from  the squatting position due to knee joint discomfort.  No warmth swelling or effusion was noted.  Plan: XR KNEE 3 VIEW RIGHT, XR KNEE 3 VIEW LEFT.  X-ray showed mild osteoarthritis and moderate chondromalacia patella bilaterally.  Handout on knee exercises was given.  Neck pain -she has been experiencing neck pain for the last 1 week.  She has been using over-the-counter topical agents.  Plan: XR Cervical Spine 2 or 3 views.  C5-6 narrowing was noted.  Mild facet joint arthropathy was noted.  Handout on neck exercises was given.  Chronic midline low back pain without sciatica -she gives history of lower back pain for many years.  Plan: XR Lumbar Spine 2-3 Views.  Multilevel spondylosis and mild dextroscoliosis was noted.  Facet joint arthropathy was noted.  Handout on back exercises was given.  DDD (degenerative disc disease), lumbar  Other medical problems are listed as follows:  History of MI (myocardial infarction)  History of coronary artery disease  History of hyperlipidemia  History of gastroesophageal reflux (GERD)  Vitamin D deficiency  Depression with anxiety  History of MRSA infection  History of cervical cancer   Orders: Orders Placed This Encounter  Procedures  . XR KNEE 3 VIEW RIGHT  . XR KNEE 3 VIEW LEFT  . XR Hand 2 View Right  . XR Hand 2 View Left  . XR Cervical Spine 2 or 3 views  . XR Lumbar Spine 2-3 Views  . Urinalysis, Routine w reflex microscopic  . CK  . Sedimentation rate  . Anti-scleroderma antibody  . RNP Antibody  . Anti-Smith antibody  . Sjogrens syndrome-A extractable nuclear antibody  . Sjogrens syndrome-B extractable nuclear antibody  . Anti-DNA antibody, double-stranded  . C3 and C4  . Beta-2 glycoprotein antibodies  . Cardiolipin antibodies, IgG, IgM, IgA  . Lupus Anticoagulant Eval w/Reflex  . Glucose 6 phosphate dehydrogenase  . Cyclic citrul peptide antibody, IgG   No orders of the defined types were placed in this  encounter.   Face-to-face time spent with patient was 45 minutes. Greater than 50% of time was spent in counseling and coordination of care.  Follow-Up Instructions: Return for OA, +ANA.   Bo Merino, MD  Note - This record has been created using Dragon software.  Chart creation errors have been sought, but may not always  have been located. Such creation errors do not reflect on  the standard of medical care.  

## 2018-03-01 ENCOUNTER — Other Ambulatory Visit: Payer: Self-pay | Admitting: Family Medicine

## 2018-03-01 NOTE — Telephone Encounter (Signed)
Last OV 01/24/18 Alprazolam last filled 11/22/17 #30 with 3

## 2018-03-12 ENCOUNTER — Ambulatory Visit (INDEPENDENT_AMBULATORY_CARE_PROVIDER_SITE_OTHER): Payer: Self-pay

## 2018-03-12 ENCOUNTER — Ambulatory Visit (INDEPENDENT_AMBULATORY_CARE_PROVIDER_SITE_OTHER): Payer: 59

## 2018-03-12 ENCOUNTER — Encounter: Payer: Self-pay | Admitting: Rheumatology

## 2018-03-12 ENCOUNTER — Ambulatory Visit (INDEPENDENT_AMBULATORY_CARE_PROVIDER_SITE_OTHER): Payer: 59 | Admitting: Rheumatology

## 2018-03-12 VITALS — BP 134/94 | HR 100 | Resp 13 | Ht 62.0 in | Wt 198.0 lb

## 2018-03-12 DIAGNOSIS — M542 Cervicalgia: Secondary | ICD-10-CM

## 2018-03-12 DIAGNOSIS — E559 Vitamin D deficiency, unspecified: Secondary | ICD-10-CM

## 2018-03-12 DIAGNOSIS — Z8541 Personal history of malignant neoplasm of cervix uteri: Secondary | ICD-10-CM

## 2018-03-12 DIAGNOSIS — G8929 Other chronic pain: Secondary | ICD-10-CM

## 2018-03-12 DIAGNOSIS — I252 Old myocardial infarction: Secondary | ICD-10-CM

## 2018-03-12 DIAGNOSIS — F418 Other specified anxiety disorders: Secondary | ICD-10-CM

## 2018-03-12 DIAGNOSIS — Z8614 Personal history of Methicillin resistant Staphylococcus aureus infection: Secondary | ICD-10-CM

## 2018-03-12 DIAGNOSIS — M25561 Pain in right knee: Secondary | ICD-10-CM | POA: Diagnosis not present

## 2018-03-12 DIAGNOSIS — M545 Low back pain: Secondary | ICD-10-CM

## 2018-03-12 DIAGNOSIS — M79642 Pain in left hand: Secondary | ICD-10-CM | POA: Diagnosis not present

## 2018-03-12 DIAGNOSIS — M25562 Pain in left knee: Secondary | ICD-10-CM

## 2018-03-12 DIAGNOSIS — Z8719 Personal history of other diseases of the digestive system: Secondary | ICD-10-CM

## 2018-03-12 DIAGNOSIS — M5136 Other intervertebral disc degeneration, lumbar region: Secondary | ICD-10-CM

## 2018-03-12 DIAGNOSIS — M79641 Pain in right hand: Secondary | ICD-10-CM | POA: Diagnosis not present

## 2018-03-12 DIAGNOSIS — Z8639 Personal history of other endocrine, nutritional and metabolic disease: Secondary | ICD-10-CM

## 2018-03-12 DIAGNOSIS — R768 Other specified abnormal immunological findings in serum: Secondary | ICD-10-CM | POA: Diagnosis not present

## 2018-03-12 DIAGNOSIS — Z8679 Personal history of other diseases of the circulatory system: Secondary | ICD-10-CM

## 2018-03-12 NOTE — Patient Instructions (Signed)
Hand Exercises Hand exercises can be helpful to almost anyone. These exercises can strengthen the hands, improve flexibility and movement, and increase blood flow to the hands. These results can make work and daily tasks easier. Hand exercises can be especially helpful for people who have joint pain from arthritis or have nerve damage from overuse (carpal tunnel syndrome). These exercises can also help people who have injured a hand. Most of these hand exercises are fairly gentle stretching routines. You can do them often throughout the day. Still, it is a good idea to ask your health care provider which exercises would be best for you. Warming your hands before exercise may help to reduce stiffness. You can do this with gentle massage or by placing your hands in warm water for 15 minutes. Also, make sure you pay attention to your level of hand pain as you begin an exercise routine. Exercises Knuckle bend Repeat this exercise 5-10 times with each hand. 1. Stand or sit with your arm, hand, and all five fingers pointed straight up. Make sure your wrist is straight. 2. Gently and slowly bend your fingers down and inward until the tips of your fingers are touching the tops of your palm. 3. Hold this position for a few seconds. 4. Extend your fingers out to their original position, all pointing straight up again. Finger fan Repeat this exercise 5-10 times with each hand. 1. Hold your arm and hand out in front of you. Keep your wrist straight. 2. Squeeze your hand into a fist. 3. Hold this position for a few seconds. 4. Fan out, or spread apart, your hand and fingers as much as possible, stretching every joint fully. Tabletop Repeat this exercise 5-10 times with each hand. 1. Stand or sit with your arm, hand, and all five fingers pointed straight up. Make sure your wrist is straight. 2. Gently and slowly bend your fingers at the knuckles where they meet the hand until your hand is making an upside-down  L shape. Your fingers should form a tabletop. 3. Hold this position for a few seconds. 4. Extend your fingers out to their original position, all pointing straight up again. Making Os Repeat this exercise 5-10 times with each hand. 1. Stand or sit with your arm, hand, and all five fingers pointed straight up. Make sure your wrist is straight. 2. Make an O shape by touching your pointer finger to your thumb. Hold for a few seconds. Then open your hand wide. 3. Repeat this motion with each finger on your hand. Table spread Repeat this exercise 5-10 times with each hand. 1. Place your hand on a table with your palm facing down. Make sure your wrist is straight. 2. Spread your fingers out as much as possible. Hold this position for a few seconds. 3. Slide your fingers back together again. Hold for a few seconds. Ball grip Repeat this exercise 10-15 times with each hand. 1. Hold a tennis ball or another soft ball in your hand. 2. While slowly increasing pressure, squeeze the ball as hard as possible. 3. Squeeze as hard as you can for 3-5 seconds. 4. Relax and repeat.  Wrist curls Repeat this exercise 10-15 times with each hand. 1. Sit in a chair that has armrests. 2. Hold a light weight in your hand, such as a dumbbell that weighs 1-3 pounds (0.5-1.4 kg). Ask your health care provider what weight would be best for you. 3. Rest your hand just over the end of the chair arm   with your palm facing up. 4. Gently pivot your wrist up and down while holding the weight. Do not twist your wrist from side to side. Contact a health care provider if:  Your hand pain or discomfort gets much worse when you do an exercise.  Your hand pain or discomfort does not improve within 2 hours after you exercise. If you have any of these problems, stop doing these exercises right away. Do not do them again unless your health care provider says that you can. Get help right away if:  You develop sudden, severe hand  pain. If this happens, stop doing these exercises right away. Do not do them again unless your health care provider says that you can. This information is not intended to replace advice given to you by your health care provider. Make sure you discuss any questions you have with your health care provider. Document Released: 01/04/2015 Document Revised: 05/29/2017 Document Reviewed: 08/03/2014 [employer] Interactive Patient Education  2019 [employer] Back Exercises The following exercises strengthen the muscles that help to support the back. They also help to keep the lower back flexible. Doing these exercises can help to prevent back pain or lessen existing pain. If you have back pain or discomfort, try doing these exercises 2-3 times each day or as told by your health care provider. When the pain goes away, do them once each day, but increase the number of times that you repeat the steps for each exercise (do more repetitions). If you do not have back pain or discomfort, do these exercises once each day or as told by your health care provider. Exercises Single Knee to Chest Repeat these steps 3-5 times for each leg: 1. Lie on your back on a firm bed or the floor with your legs extended. 2. Bring one knee to your chest. Your other leg should stay extended and in contact with the floor. 3. Hold your knee in place by grabbing your knee or thigh. 4. Pull on your knee until you feel a gentle stretch in your lower back. 5. Hold the stretch for 10-30 seconds. 6. Slowly release and straighten your leg. Pelvic Tilt Repeat these steps 5-10 times: 1. Lie on your back on a firm bed or the floor with your legs extended. 2. Bend your knees so they are pointing toward the ceiling and your feet are flat on the floor. 3. Tighten your lower abdominal muscles to press your lower back against the floor. This motion will tilt your pelvis so your tailbone points up toward the ceiling instead of pointing to your feet  or the floor. 4. With gentle tension and even breathing, hold this position for 5-10 seconds. Cat-Cow Repeat these steps until your lower back becomes more flexible: 1. Get into a hands-and-knees position on a firm surface. Keep your hands under your shoulders, and keep your knees under your hips. You may place padding under your knees for comfort. 2. Let your head hang down, and point your tailbone toward the floor so your lower back becomes rounded like the back of a cat. 3. Hold this position for 5 seconds. 4. Slowly lift your head and point your tailbone up toward the ceiling so your back forms a sagging arch like the back of a cow. 5. Hold this position for 5 seconds.  Press-Ups Repeat these steps 5-10 times: 1. Lie on your abdomen (face-down) on the floor. 2. Place your palms near your head, about shoulder-width apart. 3. While you keep your   back as relaxed as possible and keep your hips on the floor, slowly straighten your arms to raise the top half of your body and lift your shoulders. Do not use your back muscles to raise your upper torso. You may adjust the placement of your hands to make yourself more comfortable. 4. Hold this position for 5 seconds while you keep your back relaxed. 5. Slowly return to lying flat on the floor.  Bridges Repeat these steps 10 times: 1. Lie on your back on a firm surface. 2. Bend your knees so they are pointing toward the ceiling and your feet are flat on the floor. 3. Tighten your buttocks muscles and lift your buttocks off of the floor until your waist is at almost the same height as your knees. You should feel the muscles working in your buttocks and the back of your thighs. If you do not feel these muscles, slide your feet 1-2 inches farther away from your buttocks. 4. Hold this position for 3-5 seconds. 5. Slowly lower your hips to the starting position, and allow your buttocks muscles to relax completely. If this exercise is too easy, try doing  it with your arms crossed over your chest. Abdominal Crunches Repeat these steps 5-10 times: 1. Lie on your back on a firm bed or the floor with your legs extended. 2. Bend your knees so they are pointing toward the ceiling and your feet are flat on the floor. 3. Cross your arms over your chest. 4. Tip your chin slightly toward your chest without bending your neck. 5. Tighten your abdominal muscles and slowly raise your trunk (torso) high enough to lift your shoulder blades a tiny bit off of the floor. Avoid raising your torso higher than that, because it can put too much stress on your low back and it does not help to strengthen your abdominal muscles. 6. Slowly return to your starting position. Back Lifts Repeat these steps 5-10 times: 1. Lie on your abdomen (face-down) with your arms at your sides, and rest your forehead on the floor. 2. Tighten the muscles in your legs and your buttocks. 3. Slowly lift your chest off of the floor while you keep your hips pressed to the floor. Keep the back of your head in line with the curve in your back. Your eyes should be looking at the floor. 4. Hold this position for 3-5 seconds. 5. Slowly return to your starting position. Contact a health care provider if:  Your back pain or discomfort gets much worse when you do an exercise.  Your back pain or discomfort does not lessen within 2 hours after you exercise. If you have any of these problems, stop doing these exercises right away. Do not do them again unless your health care provider says that you can. Get help right away if:  You develop sudden, severe back pain. If this happens, stop doing the exercises right away. Do not do them again unless your health care provider says that you can. This information is not intended to replace advice given to you by your health care provider. Make sure you discuss any questions you have with your health care provider. Document Released: 03/02/2004 Document  Revised: 05/29/2017 Document Reviewed: 03/19/2014 [employer] Interactive Patient Education  2019 [employer] Cervical Strain and Sprain Rehab Ask your health care provider which exercises are safe for you. Do exercises exactly as told by your health care provider and adjust them as directed. It is normal to feel mild stretching,  pulling, tightness, or discomfort as you do these exercises, but you should stop right away if you feel sudden pain or your pain gets worse.Do not begin these exercises until told by your health care provider. Stretching and range of motion exercises These exercises warm up your muscles and joints and improve the movement and flexibility of your neck. These exercises also help to relieve pain, numbness, and tingling. Exercise A: Cervical side bend  5. Using good posture, sit on a stable chair or stand up. 6. Without moving your shoulders, slowly tilt your left / right ear to your shoulder until you feel a stretch in your neck muscles. You should be looking straight ahead. 7. Hold for __________ seconds. 8. Repeat with the other side of your neck. Repeat __________ times. Complete this exercise __________ times a day. Exercise B: Cervical rotation  1. Using good posture, sit on a stable chair or stand up. 2. Slowly turn your head to the side as if you are looking over your left / right shoulder. ? Keep your eyes level with the ground. ? Stop when you feel a stretch along the side and the back of your neck. 3. Hold for __________ seconds. 4. Repeat this by turning to your other side. Repeat __________ times. Complete this exercise __________ times a day. Exercise C: Thoracic extension and pectoral stretch 5. Roll a towel or a small blanket so it is about 4 inches (10 cm) in diameter. 6. Lie down on your back on a firm surface. 7. Put the towel lengthwise, under your spine in the middle of your back. It should not be not under your shoulder blades. The towel should  line up with your spine from your middle back to your lower back. 8. Put your hands behind your head and let your elbows fall out to your sides. 9. Hold for __________ seconds. Repeat __________ times. Complete this exercise __________ times a day. Strengthening exercises These exercises build strength and endurance in your neck. Endurance is the ability to use your muscles for a long time, even after your muscles get tired. Exercise D: Upper cervical flexion, isometric 4. Lie on your back with a thin pillow behind your head and a small rolled-up towel under your neck. 5. Gently tuck your chin toward your chest and nod your head down to look toward your feet. Do not lift your head off the pillow. 6. Hold for __________ seconds. 7. Release the tension slowly. Relax your neck muscles completely before you repeat this exercise. Repeat __________ times. Complete this exercise __________ times a day. Exercise E: Cervical extension, isometric  4. Stand about 6 inches (15 cm) away from a wall, with your back facing the wall. 5. Place a soft object, about 6-8 inches (15-20 cm) in diameter, between the back of your head and the wall. A soft object could be a small pillow, a ball, or a folded towel. 6. Gently tilt your head back and press into the soft object. Keep your jaw and forehead relaxed. 7. Hold for __________ seconds. 8. Release the tension slowly. Relax your neck muscles completely before you repeat this exercise. Repeat __________ times. Complete this exercise __________ times a day. Posture and body mechanics Body mechanics refers to the movements and positions of your body while you do your daily activities. Posture is part of body mechanics. Good posture and healthy body mechanics can help to relieve stress in your body's tissues and joints. Good posture means that your spine is in  its natural S-curve position (your spine is neutral), your shoulders are pulled back slightly, and your head is  not tipped forward. The following are general guidelines for applying improved posture and body mechanics to your everyday activities. Standing   When standing, keep your spine neutral and keep your feet about hip-width apart. Keep a slight bend in your knees. Your ears, shoulders, and hips should line up.  When you do a task in which you stand in one place for a long time, place one foot up on a stable object that is 2-4 inches (5-10 cm) high, such as a footstool. This helps keep your spine neutral. Sitting   When sitting, keep your spine neutral and your keep feet flat on the floor. Use a footrest, if necessary, and keep your thighs parallel to the floor. Avoid rounding your shoulders, and avoid tilting your head forward.  When working at a desk or a computer, keep your desk at a height where your hands are slightly lower than your elbows. Slide your chair under your desk so you are close enough to maintain good posture.  When working at a computer, place your monitor at a height where you are looking straight ahead and you do not have to tilt your head forward or downward to look at the screen. Resting When lying down and resting, avoid positions that are most painful for you. Try to support your neck in a neutral position. You can use a contour pillow or a small rolled-up towel. Your pillow should support your neck but not push on it. This information is not intended to replace advice given to you by your health care provider. Make sure you discuss any questions you have with your health care provider. Document Released: 01/23/2005 Document Revised: 09/30/2015 Document Reviewed: 12/30/2014 [employer] Interactive Patient Education  2019 [employer].

## 2018-03-13 ENCOUNTER — Other Ambulatory Visit: Payer: Self-pay | Admitting: Family Medicine

## 2018-03-13 NOTE — Telephone Encounter (Signed)
Last OV 01/24/18 Tramadol last filled 02/08/18 #30 with 0

## 2018-03-15 LAB — URINALYSIS, ROUTINE W REFLEX MICROSCOPIC
Bilirubin Urine: NEGATIVE
Glucose, UA: NEGATIVE
Hgb urine dipstick: NEGATIVE
Ketones, ur: NEGATIVE
Leukocytes, UA: NEGATIVE
Nitrite: NEGATIVE
Protein, ur: NEGATIVE
Specific Gravity, Urine: 1.019 (ref 1.001–1.03)
pH: 6.5 (ref 5.0–8.0)

## 2018-03-15 LAB — RNP ANTIBODY: Ribonucleic Protein(ENA) Antibody, IgG: <1 AI

## 2018-03-15 LAB — CYCLIC CITRUL PEPTIDE ANTIBODY, IGG: Cyclic Citrullin Peptide Ab: <16 UNITS

## 2018-03-15 LAB — SEDIMENTATION RATE: Sed Rate: 6 mm/h (ref 0–30)

## 2018-03-15 LAB — BETA-2 GLYCOPROTEIN ANTIBODIES
Beta-2 Glyco 1 IgA: <9 SAU (ref <=20)
Beta-2 Glyco 1 IgM: <9 SMU (ref <=20)
Beta-2 Glyco I IgG: <9 SGU (ref <=20)

## 2018-03-15 LAB — LUPUS ANTICOAGULANT EVAL W/ REFLEX
PTT-LA Screen: 35 s (ref <=40)
dRVVT: 47 s — ABNORMAL HIGH (ref <=45)

## 2018-03-15 LAB — RFX DRVVT 1:1 MIX

## 2018-03-15 LAB — CK: Total CK: 93 U/L (ref 29–143)

## 2018-03-15 LAB — C3 AND C4
C3 Complement: 148 mg/dL (ref 83–193)
C4 Complement: 26 mg/dL (ref 15–57)

## 2018-03-15 LAB — CARDIOLIPIN ANTIBODIES, IGG, IGM, IGA
Anticardiolipin IgA: <11 [APL'U]
Anticardiolipin IgG: <14 [GPL'U]
Anticardiolipin IgM: <12 [MPL'U]

## 2018-03-15 LAB — ANTI-SMITH ANTIBODY: ENA SM Ab Ser-aCnc: <1 AI

## 2018-03-15 LAB — ANTI-SCLERODERMA ANTIBODY: Scleroderma (Scl-70) (ENA) Antibody, IgG: <1 AI

## 2018-03-15 LAB — RFLX DRVVT CONFRIM: DRVVT CONFIRM: POSITIVE — AB

## 2018-03-15 LAB — SJOGRENS SYNDROME-A EXTRACTABLE NUCLEAR ANTIBODY: SSA (Ro) (ENA) Antibody, IgG: <1 AI

## 2018-03-15 LAB — GLUCOSE 6 PHOSPHATE DEHYDROGENASE: G-6PDH: 12.7 U/g Hgb (ref 7.0–20.5)

## 2018-03-15 LAB — SJOGRENS SYNDROME-B EXTRACTABLE NUCLEAR ANTIBODY: SSB (La) (ENA) Antibody, IgG: <1 AI

## 2018-03-15 LAB — ANTI-DNA ANTIBODY, DOUBLE-STRANDED: ds DNA Ab: <1 IU/mL

## 2018-03-18 ENCOUNTER — Other Ambulatory Visit: Payer: Self-pay | Admitting: Emergency Medicine

## 2018-03-18 DIAGNOSIS — F5101 Primary insomnia: Secondary | ICD-10-CM

## 2018-03-18 NOTE — Telephone Encounter (Signed)
Last rx on 11/22/17 #30 3 RF Last OV: 01/24/18 CPE  Please advise of refill

## 2018-03-19 MED ORDER — ZOLPIDEM TARTRATE 10 MG PO TABS: 10.0000 mg | ORAL_TABLET | Freq: Every evening | ORAL | 3 refills | Status: DC | PRN

## 2018-03-27 ENCOUNTER — Encounter: Payer: Self-pay | Admitting: Family Medicine

## 2018-03-27 DIAGNOSIS — M5136 Other intervertebral disc degeneration, lumbar region: Secondary | ICD-10-CM | POA: Insufficient documentation

## 2018-03-27 DIAGNOSIS — M5030 Other cervical disc degeneration, unspecified cervical region: Secondary | ICD-10-CM | POA: Insufficient documentation

## 2018-03-27 DIAGNOSIS — M19042 Primary osteoarthritis, left hand: Secondary | ICD-10-CM | POA: Insufficient documentation

## 2018-03-27 DIAGNOSIS — M19041 Primary osteoarthritis, right hand: Secondary | ICD-10-CM | POA: Insufficient documentation

## 2018-03-27 DIAGNOSIS — M170 Bilateral primary osteoarthritis of knee: Secondary | ICD-10-CM | POA: Insufficient documentation

## 2018-03-27 NOTE — Progress Notes (Signed)
Office Visit Note  Patient: Maria Bishop             Date of Birth: [birth year]           MRN: [id number]             PCP: [name], MD Referring: [name], MD Visit Date: 04/09/2018 Occupation: _0 @  Subjective:  Pain in both hands   History of Present Illness: [name] is a 58 y.o. female with history of positive ANA, osteoarthritis, and DDD.  She reports that she continues to have pain in bilateral hands.  She denies any joint swelling or joint stiffness.  She states yesterday she was having significant discomfort while at work.  She states that occasionally she will have shooting pains.  She denies any numbness.  She has chronic pain in bilateral knee joints but denies any joint swelling at this time.  She reports he continues have recurrent oral and nasal ulcerations.  She reports that the frequency number of sores in her mouth is increased.  She reports that the sores are painful.  She continues have dry mouth but no eye dryness.  She denied any symptoms of Raynaud's and no ulcerations.  She continues have photosensitivity but denies any rashes at this time.  She has noticed hair loss. She has chronic fatigue but no fevers or swollen lymph nodes.    Activities of Daily Living:  Patient reports morning stiffness for several hours.   Patient Denies nocturnal pain.  Difficulty dressing/grooming: Denies Difficulty climbing stairs: Reports Difficulty getting out of chair: Denies Difficulty using hands for taps, buttons, cutlery, and/or writing: Reports  Review of Systems  Constitutional: Positive for fatigue.  HENT: Positive for mouth sores. Negative for mouth dryness and nose dryness.   Eyes: Negative for pain, itching, visual disturbance and dryness.  Respiratory: Negative for cough, hemoptysis, shortness of breath, wheezing and difficulty breathing.   Cardiovascular: Negative for chest pain, palpitations, hypertension and swelling in  legs/feet.  Gastrointestinal: Negative for abdominal pain, blood in stool, constipation and diarrhea.  Endocrine: Negative for increased urination.  Genitourinary: Negative for painful urination and pelvic pain.  Musculoskeletal: Positive for arthralgias, joint pain, joint swelling and morning stiffness. Negative for myalgias, muscle weakness, muscle tenderness and myalgias.  Skin: Negative for color change, pallor, rash, hair loss, nodules/bumps, skin tightness, ulcers and sensitivity to sunlight.  Allergic/Immunologic: Negative for susceptible to infections.  Neurological: Negative for dizziness, light-headedness, numbness, headaches and memory loss.  Hematological: Negative for swollen glands.  Psychiatric/Behavioral: Negative for depressed mood, confusion and sleep disturbance. The patient is not nervous/anxious.     PMFS History:  Patient Active Problem List   Diagnosis Date Noted  . Primary osteoarthritis of both knees 03/27/2018  . Primary osteoarthritis of both hands 03/27/2018  . DDD (degenerative disc disease), lumbar 03/27/2018  . DDD (degenerative disc disease), cervical 03/27/2018  . Vitamin D deficiency 01/24/2018  . Chest pain 01/25/2016  . Lumbar radiculopathy 08/06/2015  . Memory loss 12/02/2014  . History of cervical cancer 12/02/2014  . History of MI (myocardial infarction) 12/02/2014  . Acute bacterial sinusitis 10/29/2014  . Insomnia 08/31/2014  . GERD (gastroesophageal reflux disease) 07/08/2013  . Abdominal pain, epigastric 07/08/2013  . Screening for malignant neoplasm of cervix 07/19/2012  . Routine general medical examination at a health care facility 07/19/2012  . Depression with anxiety 06/06/2012  . Hyperlipidemia 09/06/2011  . CAD (coronary artery disease) 06/24/2001    Past Medical History:  Diagnosis Date  . CAD (coronary artery disease)    a. NSTEMI 2013: likely spontaneous dissection/ruptured plaque in OM3 - PCI: 3 overlapping Promus DES to OM3,  EF 55% b. cath 2014: patent stents  . Cervical cancer ([place])   . Degenerative disc disease   . Myocardial infarction ([place])    ~36yr ago  . NSTEMI (non-ST elevated myocardial infarction) [place] 06/24/2011   a. NSTEMI 2013: likely spontaneous dissection/ruptured plaque in OM3 - PCI: 3 overlapping Promus DES to OM3, EF 55%     Family History  Problem Relation Age of Onset  . Alzheimer's disease Maternal Uncle   . Coronary artery disease Mother        MI in her 437s . Early death Father   . Brain cancer Sister   . Drug abuse Son   . Asthma Daughter   . Anxiety disorder Daughter    Past Surgical History:  Procedure Laterality Date  . APPENDECTOMY    . [place] bunionectomy right  February 2015  . CARDIAC CATHETERIZATION N/A 01/26/2016   Procedure: Left Heart Cath and Coronary Angiography;  Surgeon: HBelva Crome MD;  [facility];  Service: Cardiovascular;  Laterality: N/A;  . LEFT HEART CATHETERIZATION WITH CORONARY ANGIOGRAM N/A 06/24/2011   Procedure: LEFT HEART CATHETERIZATION WITH CORONARY ANGIOGRAM;  Surgeon: JJettie Booze MD;  [facility];  Service: Cardiovascular;  Laterality: N/A;  . PERCUTANEOUS CORONARY STENT INTERVENTION (PCI-S) Right 06/24/2011   Procedure: PERCUTANEOUS CORONARY STENT INTERVENTION (PCI-S);  Surgeon: JJettie Booze MD;  [facility];  Service: Cardiovascular;  Laterality: Right;  . TUBAL LIGATION     Social History   Social History Narrative  . Not on file   Immunization History  Administered Date(s) Administered  . Influenza,inj,Quad PF,6+ Mos 01/24/2018  . Tdap 06/06/2014     Objective: Vital Signs: BP 130/89 (BP Location: Left Arm, Patient Position: Sitting, Cuff Size: Normal)   Pulse (!) 104   Resp 14   Ht _0  (1.575 m)   Wt 201 lb (91.2 kg)   LMP 07/05/2012   BMI 36.76 kg/m    Physical Exam Vitals signs and nursing note reviewed.  Constitutional:      Appearance: She is well-developed.    HENT:     Head: Normocephalic and atraumatic.  Eyes:     Conjunctiva/sclera: Conjunctivae normal.  Neck:     Musculoskeletal: Normal range of motion.  Cardiovascular:     Rate and Rhythm: Normal rate and regular rhythm.     Heart sounds: Normal heart sounds.  Pulmonary:     Effort: Pulmonary effort is normal.     Breath sounds: Normal breath sounds.  Abdominal:     General: Bowel sounds are normal.     Palpations: Abdomen is soft.  Lymphadenopathy:     Cervical: No cervical adenopathy.  Skin:    General: Skin is warm and dry.     Capillary Refill: Capillary refill takes less than 2 seconds.     Comments: No digital ulcerations or signs of gangrene.  No malar rash noted.  Neurological:     Mental Status: She is alert and oriented to person, place, and time.  Psychiatric:        Behavior: Behavior normal.      Musculoskeletal Exam: C-spine, thoracic spine, and lumbar spine good ROM.  No midline spinal tenderness.  No SI joint tenderness.  Shoulder joints, elbow joints, wrist joints, MCPs, PIPs, and DIPs  good ROM with no synovitis.  PIP and DIP synovial thickening.  Bilateral CMC joint tenderness. Complete fist formation bilaterally. Hip joints, knee joints, ankle joints, MTPs, PIPs, and DIPs good ROM with no synovitis.  No warmth or effusion of knee joints.  No tenderness or swelling of ankle joints.  No tenderness over trochanteric bursa bilaterally.    CDAI Exam: CDAI Score: Not documented Patient Global Assessment: Not documented; Provider Global Assessment: Not documented Swollen: Not documented; Tender: Not documented Joint Exam   Not documented   There is currently no information documented on the homunculus. Go to the Rheumatology activity and complete the homunculus joint exam.  Investigation: No additional findings.  Imaging: Xr Cervical Spine 2 Or 3 Views  Result Date: 03/12/2018 C5-C6 narrowing was noted.  Multilevel mild spondylosis was noted. Impression:  These findings are consistent with degenerative disc disease of cervical spine.  Xr Hand 2 View Left  Result Date: 03/12/2018 PIP, DIP and CMC narrowing was noted.  No metacarpal, intercarpal or radiocarpal narrowing was noted.  No erosive changes were noted. Impression: These findings are consistent with osteoarthritis of the hand.  Xr Hand 2 View Right  Result Date: 03/12/2018 PIP DIP and CMC narrowing was noted.  No MCP intercarpal or radiocarpal joint space narrowing was noted.  No erosive changes were noted.  Cyst was noted in the carpal bone. Impression: These findings were consistent with osteoarthritis of the hand.  Xr Knee 3 View Left  Result Date: 03/12/2018 Mild medial compartment narrowing was noted.  Moderate patellofemoral narrowing was noted.  No chondrocalcinosis was noted. Impression: These findings are consistent with mild osteoarthritis and moderate chondromalacia patella.  Xr Knee 3 View Right  Result Date: 03/12/2018 Mild medial compartment narrowing with medial and lateral osteophytes were noted.  Patellofemoral narrowing was noted. Impression: These findings are consistent with mild osteoarthritis and moderate chondromalacia patella.  Xr Lumbar Spine 2-3 Views  Result Date: 03/12/2018 Multilevel spondylosis and facet joint arthropathy was noted.  Significant narrowing was noted between L2-3, L3-4, L4-5.  Mild L4-L5 spondylolisthesis was noted.  Dextroscoliosis was noted.  No SI joint changes were noted. Impression: These findings are consistent with multilevel spondylosis and facet joint arthropathy.   Recent Labs: Lab Results  Component Value Date   WBC 4.8 01/24/2018   HGB 14.2 01/24/2018   PLT 283.0 01/24/2018   NA 138 01/24/2018   K 3.9 01/24/2018   CL 103 01/24/2018   CO2 27 01/24/2018   GLUCOSE 88 01/24/2018   BUN 16 01/24/2018   CREATININE 0.72 01/24/2018   BILITOT 0.5 01/24/2018   ALKPHOS 72 01/24/2018   AST 16 01/24/2018   ALT 13 01/24/2018   PROT  7.3 01/24/2018   ALBUMIN 4.6 01/24/2018   CALCIUM 9.7 01/24/2018   GFRAA >60 01/26/2016  UA negative, lupus anticoagulant negative, anticardiolipin negative, beta-2 GP 1-, ENA negative, C3-C4 normal, ESR 6, CK 93, anti-CCP negative, G6PD normal  Speciality Comments: No specialty comments available.  Procedures:  No procedures performed Allergies: Shellfish allergy; Statins; Zetia [ezetimibe]; and Mobic [meloxicam]   Assessment / Plan:     Visit Diagnoses: DDD (degenerative disc disease), cervical: She has good ROM on exam.  She has no symptoms of radiculopathy at this time.   DDD (degenerative disc disease), lumbar - Mild spondylosis and facet joint arthropathy: She has no midline spinal tenderness.  She has good ROM with no discomfort.   Primary osteoarthritis of both hands: She has PIP and DIP synovial thickening.  She has bilateral CMC joint tenderness.  No synovitis noted. Joint protection and muscle strengthening were discussed. We discussed scheduling an ultrasound of both hands to assess for synovitis. She declined at this time. She will notify us if she develops increased joint pain or joint swelling.   Primary osteoarthritis of both knees - Bilateral mild osteoarthritis and mild chondromalacia patella: She has chronic pain in both knee joints. No warmth or effusion.  Good ROM with no discomfort.    Positive ANA (antinuclear antibody) - ANA 1: 320 homogeneous, ENA negative, C3-C4 normal.  History of intermittent joint swelling, sicca symptoms, oral ulcers, photosensitivity, hair loss: We reviewed lab results obtained on 03/12/18.  All questions were addressed.  She has no synovitis on exam.  She has no malar rash.  She has mouth dryness but no parotid swelling or tenderness.  She has recurrent painful oral and nasal ulcerations.  She has not had any symptoms of Raynaud's and no digital ulcerations were noted.  She has chronic fatigue but no cervical lymphadenopathy or fevers.  No  palpitations or shortness of breath.  We discussed scheduling an ultrasound of both hands to assess for synovitis.  We will continue to check autoimmune labs on a yearly basis.  She was advised to notify us if she develops new or worsening symptoms.  She will follow up in 1 year.   Other medical conditions are listed as follows:   Depression with anxiety  History of coronary artery disease  History of hyperlipidemia  Vitamin D deficiency  History of gastroesophageal reflux (GERD)  History of cervical cancer  History of MRSA infection   Orders: No orders of the defined types were placed in this encounter.  No orders of the defined types were placed in this encounter.     Follow-Up Instructions: Return in about 1 year (around 04/09/2019) for Positive ANA, Osteoarthritis, DDD.   Ofilia Neas, PA-C   I examined and evaluated the patient with Hazel Sams PA.  Patient had no synovitis on examination.  She has clinical findings of osteoarthritis.  She has positive ANA but no other clinical features of autoimmune disease at this point.  We had detailed discussion regarding reaching out to [place] if she develops any new symptoms.  The plan of care was discussed as noted above.  Bo Merino, MD  Note - This record has been created using Editor, commissioning.  Chart creation errors have been sought, but may not always  have been located. Such creation errors do not reflect on  the standard of medical care.

## 2018-04-02 ENCOUNTER — Other Ambulatory Visit: Payer: Self-pay | Admitting: Family Medicine

## 2018-04-09 ENCOUNTER — Encounter: Payer: Self-pay | Admitting: Rheumatology

## 2018-04-09 ENCOUNTER — Ambulatory Visit (INDEPENDENT_AMBULATORY_CARE_PROVIDER_SITE_OTHER): Payer: 59 | Admitting: Rheumatology

## 2018-04-09 VITALS — BP 130/89 | HR 104 | Resp 14 | Ht 62.0 in | Wt 201.0 lb

## 2018-04-09 DIAGNOSIS — M170 Bilateral primary osteoarthritis of knee: Secondary | ICD-10-CM | POA: Diagnosis not present

## 2018-04-09 DIAGNOSIS — M19041 Primary osteoarthritis, right hand: Secondary | ICD-10-CM | POA: Diagnosis not present

## 2018-04-09 DIAGNOSIS — M5030 Other cervical disc degeneration, unspecified cervical region: Secondary | ICD-10-CM | POA: Diagnosis not present

## 2018-04-09 DIAGNOSIS — Z8541 Personal history of malignant neoplasm of cervix uteri: Secondary | ICD-10-CM

## 2018-04-09 DIAGNOSIS — Z8639 Personal history of other endocrine, nutritional and metabolic disease: Secondary | ICD-10-CM

## 2018-04-09 DIAGNOSIS — M5136 Other intervertebral disc degeneration, lumbar region: Secondary | ICD-10-CM | POA: Diagnosis not present

## 2018-04-09 DIAGNOSIS — Z8679 Personal history of other diseases of the circulatory system: Secondary | ICD-10-CM

## 2018-04-09 DIAGNOSIS — Z8614 Personal history of Methicillin resistant Staphylococcus aureus infection: Secondary | ICD-10-CM

## 2018-04-09 DIAGNOSIS — M51369 Other intervertebral disc degeneration, lumbar region without mention of lumbar back pain or lower extremity pain: Secondary | ICD-10-CM

## 2018-04-09 DIAGNOSIS — M19042 Primary osteoarthritis, left hand: Secondary | ICD-10-CM

## 2018-04-09 DIAGNOSIS — E559 Vitamin D deficiency, unspecified: Secondary | ICD-10-CM

## 2018-04-09 DIAGNOSIS — F418 Other specified anxiety disorders: Secondary | ICD-10-CM

## 2018-04-09 DIAGNOSIS — Z8719 Personal history of other diseases of the digestive system: Secondary | ICD-10-CM

## 2018-04-09 DIAGNOSIS — R768 Other specified abnormal immunological findings in serum: Secondary | ICD-10-CM

## 2018-04-10 ENCOUNTER — Other Ambulatory Visit: Payer: Self-pay | Admitting: Family Medicine

## 2018-04-24 ENCOUNTER — Encounter: Payer: Self-pay | Admitting: Family Medicine

## 2018-04-29 ENCOUNTER — Encounter (INDEPENDENT_AMBULATORY_CARE_PROVIDER_SITE_OTHER): Payer: 59 | Admitting: Family Medicine

## 2018-04-29 DIAGNOSIS — F419 Anxiety disorder, unspecified: Secondary | ICD-10-CM

## 2018-04-29 MED ORDER — BUSPIRONE HCL 7.5 MG PO TABS: 7.5000 mg | ORAL_TABLET | Freq: Two times a day (BID) | ORAL | 3 refills | Status: DC

## 2018-04-29 NOTE — Telephone Encounter (Signed)
Cumulative Time: 9 minutes Consent: Documented above People Involved: pt, myself CC: Anxiety HPI: pt is having worsening anxiety and having to take Alprazolam more regularly w/o improvement in sxs.  She is on Wellbutrin and Prozac daily.  Asking for something additional to help AP: Anxiety.  Will start Buspar in addition to current Wellbutrin and Prozac.  Pt is to follow up by MyChart in a few weeks- sooner if needed.  Pt is aware that we will titrate meds as needed.

## 2018-05-04 ENCOUNTER — Other Ambulatory Visit: Payer: Self-pay | Admitting: Family Medicine

## 2018-05-09 ENCOUNTER — Encounter: Payer: Self-pay | Admitting: Family Medicine

## 2018-05-10 ENCOUNTER — Other Ambulatory Visit: Payer: Self-pay

## 2018-05-10 ENCOUNTER — Encounter: Payer: Self-pay | Admitting: Family Medicine

## 2018-05-10 ENCOUNTER — Ambulatory Visit (INDEPENDENT_AMBULATORY_CARE_PROVIDER_SITE_OTHER): Payer: 59 | Admitting: Family Medicine

## 2018-05-10 VITALS — BP 127/83 | Temp 98.9°F | Ht 62.0 in | Wt 195.0 lb

## 2018-05-10 DIAGNOSIS — F418 Other specified anxiety disorders: Secondary | ICD-10-CM

## 2018-05-10 DIAGNOSIS — R300 Dysuria: Secondary | ICD-10-CM

## 2018-05-10 MED ORDER — CEPHALEXIN 500 MG PO CAPS: 500.0000 mg | ORAL_CAPSULE | Freq: Two times a day (BID) | ORAL | 0 refills | Status: AC

## 2018-05-10 NOTE — Progress Notes (Signed)
Virtual Visit via Video   I connected with@ on 05/10/18 at  2:30 PM EDT by a video enabled telemedicine application and verified that I am speaking with the correct person using two identifiers. Location patient: Home Location provider: Acupuncturist, Office Persons participating in the virtual visit: Pt and myself  I discussed the limitations of evaluation and management by telemedicine and the availability of in person appointments. The patient expressed understanding and agreed to proceed.  Interactive audio and video telecommunications were attempted between this provider and patient, however failed, due to patient having technical difficulties OR patient did not have access to video capability.  We continued and completed visit with audio only.   Subjective:   HPI:  ? UTI- + urine odor intermittently x1 week, 'it smells like sulfa'.  + suprapubic pressure.  Increased urinary frequency, incomplete emptying.  No fevers.  + burning w/ urination. + LBP.  Has been taking AZO w/ some relief  Anxiety- pt reports symptomatic improvement w/ addition of Buspar.  Feeling well enough to go outside and walk and enjoy things.  ROS: See pertinent positives and negatives per HPI.  Patient Active Problem List   Diagnosis Date Noted  . Primary osteoarthritis of both knees 03/27/2018  . Primary osteoarthritis of both hands 03/27/2018  . DDD (degenerative disc disease), lumbar 03/27/2018  . DDD (degenerative disc disease), cervical 03/27/2018  . Vitamin D deficiency 01/24/2018  . Chest pain 01/25/2016  . Lumbar radiculopathy 08/06/2015  . Memory loss 12/02/2014  . History of cervical cancer 12/02/2014  . History of MI (myocardial infarction) 12/02/2014  . Acute bacterial sinusitis 10/29/2014  . Insomnia 08/31/2014  . GERD (gastroesophageal reflux disease) 07/08/2013  . Abdominal pain, epigastric 07/08/2013  . Screening for malignant neoplasm of cervix 07/19/2012  . Routine general  medical examination at a health care facility 07/19/2012  . Depression with anxiety 06/06/2012  . Hyperlipidemia 09/06/2011  . CAD (coronary artery disease) 06/24/2001    Social History   Tobacco Use  . Smoking status: Former Smoker    Types: Cigarettes  . Smokeless tobacco: Never Used  . Tobacco comment: quit over 30 years ago   Substance Use Topics  . Alcohol use: Yes    Alcohol/week: 0.0 standard drinks    Comment: occ    Current Outpatient Medications:  .  Alirocumab (PRALUENT) 75 MG/ML SOPN, Inject 75 mg into the skin every 14 (fourteen) days., Disp: 2 pen, Rfl: 11 .  ALPRAZolam (XANAX) 0.5 MG tablet, TAKE 1 TABLET BY MOUTH 2 TIMES DAILY AS NEEDED, Disp: 30 tablet, Rfl: 3 .  aspirin 81 MG tablet, Take 81 mg by mouth daily., Disp: , Rfl:  .  Aspirin-Salicylamide-Caffeine (BC HEADACHE POWDER PO), Take by mouth as needed., Disp: , Rfl:  .  buPROPion (WELLBUTRIN XL) 300 MG 24 hr tablet, TAKE 1 TABLET BY MOUTH EVERY DAY, Disp: 30 tablet, Rfl: 3 .  busPIRone (BUSPAR) 7.5 MG tablet, Take 1 tablet (7.5 mg total) by mouth 2 (two) times daily., Disp: 60 tablet, Rfl: 3 .  Cholecalciferol 10 MCG (400 UNIT) CAPS, Take by mouth daily., Disp: , Rfl:  .  cyclobenzaprine (FLEXERIL) 10 MG tablet, TAKE 1 TABLET BY MOUTH 3 TIMES DAILY AS NEEDED FOR MUSCLE SPASMS, Disp: 30 tablet, Rfl: 0 .  fexofenadine (ALLEGRA) 180 MG tablet, Take 180 mg by mouth daily. Reported on 06/02/2015, Disp: , Rfl:  .  FLUoxetine (PROZAC) 40 MG capsule, TAKE 1 CAPSULE BY MOUTH EVERY DAY, Disp: 90 capsule,  Rfl: 0 .  ibuprofen (ADVIL,MOTRIN) 200 MG tablet, Take 800 mg by mouth every 6 (six) hours as needed. For pain, Disp: , Rfl:  .  nitroGLYCERIN (NITROSTAT) 0.4 MG SL tablet, Place 1 tablet (0.4 mg total) under the tongue every 5 (five) minutes as needed., Disp: 25 tablet, Rfl: 3 .  Omega-3 Fatty Acids (FISH OIL PO), Take 1,000 mg by mouth daily., Disp: , Rfl:  .  pantoprazole (PROTONIX) 40 MG tablet, TAKE 1 TABLET BY MOUTH  EVERY DAY, Disp: 90 tablet, Rfl: 0 .  traMADol (ULTRAM) 50 MG tablet, TAKE 1 TABLET BY MOUTH EVERY 8 HOURS AS NEEDED, Disp: 30 tablet, Rfl: 3 .  TURMERIC CURCUMIN PO, Take by mouth., Disp: , Rfl:  .  zolpidem (AMBIEN) 10 MG tablet, Take 1 tablet (10 mg total) by mouth at bedtime as needed. for sleep, Disp: 30 tablet, Rfl: 3  Allergies  Allergen Reactions  . Shellfish Allergy   . Statins     Failed Crestor 5 mg qd, Simvastatin 40 mg, and Lipitor 40 mg, and pravastatin 10 mg qd due to muscle and joint pain  . Zetia [Ezetimibe]     Depression and constipation per patient  . Mobic [Meloxicam] Rash    Objective:   BP 127/83   Temp 98.9 F (37.2 C)   Ht 5\' 2"  (1.575 m)   Wt 195 lb (88.5 kg)   LMP 07/05/2012   BMI 35.67 kg/m  Pt is able to speak clearly, coherently without shortness of breath or increased work of breathing.  Thought process is linear.  Mood is appropriate.   Assessment and Plan:   Dysuria- pt's sxs are consistent w/ UTI.  Start Keflex.  If no improvement will need UA and cx.  Pt expressed understanding and is in agreement w/ plan.   Anxiety- improved w/ addition of Buspar.  Pt sounds better than she has in quite some time and says, 'it feels good to feel good'.  No med changes at this time.  Will follow.  Annye Asa, MD 05/10/2018

## 2018-05-10 NOTE — Progress Notes (Signed)
I have discussed the procedure for the virtual visit with the patient who has given consent to proceed with assessment and treatment.   BETHANY DILLARD, CMA     

## 2018-05-11 ENCOUNTER — Other Ambulatory Visit: Payer: Self-pay

## 2018-05-11 ENCOUNTER — Emergency Department (HOSPITAL_COMMUNITY): Payer: 59

## 2018-05-11 ENCOUNTER — Emergency Department (HOSPITAL_COMMUNITY)
Admission: EM | Admit: 2018-05-11 | Discharge: 2018-05-11 | Disposition: A | Discharge status: Home / Self Care | Payer: 59 | Attending: Emergency Medicine | Admitting: Emergency Medicine

## 2018-05-11 ENCOUNTER — Encounter (HOSPITAL_COMMUNITY): Payer: Self-pay

## 2018-05-11 DIAGNOSIS — Z79899 Other long term (current) drug therapy: Secondary | ICD-10-CM | POA: Insufficient documentation

## 2018-05-11 DIAGNOSIS — Z8541 Personal history of malignant neoplasm of cervix uteri: Secondary | ICD-10-CM | POA: Diagnosis not present

## 2018-05-11 DIAGNOSIS — Y999 Unspecified external cause status: Secondary | ICD-10-CM | POA: Diagnosis not present

## 2018-05-11 DIAGNOSIS — S99911A Unspecified injury of right ankle, initial encounter: Secondary | ICD-10-CM | POA: Diagnosis not present

## 2018-05-11 DIAGNOSIS — Y9389 Activity, other specified: Secondary | ICD-10-CM | POA: Insufficient documentation

## 2018-05-11 DIAGNOSIS — S93401A Sprain of unspecified ligament of right ankle, initial encounter: Secondary | ICD-10-CM | POA: Diagnosis not present

## 2018-05-11 DIAGNOSIS — I2510 Atherosclerotic heart disease of native coronary artery without angina pectoris: Secondary | ICD-10-CM | POA: Diagnosis not present

## 2018-05-11 DIAGNOSIS — R0902 Hypoxemia: Secondary | ICD-10-CM | POA: Diagnosis not present

## 2018-05-11 DIAGNOSIS — Z7982 Long term (current) use of aspirin: Secondary | ICD-10-CM | POA: Insufficient documentation

## 2018-05-11 DIAGNOSIS — X500XXA Overexertion from strenuous movement or load, initial encounter: Secondary | ICD-10-CM | POA: Insufficient documentation

## 2018-05-11 DIAGNOSIS — Z87891 Personal history of nicotine dependence: Secondary | ICD-10-CM | POA: Insufficient documentation

## 2018-05-11 DIAGNOSIS — M25571 Pain in right ankle and joints of right foot: Secondary | ICD-10-CM | POA: Diagnosis not present

## 2018-05-11 DIAGNOSIS — Z955 Presence of coronary angioplasty implant and graft: Secondary | ICD-10-CM | POA: Diagnosis not present

## 2018-05-11 DIAGNOSIS — R030 Elevated blood-pressure reading, without diagnosis of hypertension: Secondary | ICD-10-CM | POA: Insufficient documentation

## 2018-05-11 DIAGNOSIS — Y9289 Other specified places as the place of occurrence of the external cause: Secondary | ICD-10-CM | POA: Diagnosis not present

## 2018-05-11 DIAGNOSIS — R52 Pain, unspecified: Secondary | ICD-10-CM | POA: Diagnosis not present

## 2018-05-11 DIAGNOSIS — I10 Essential (primary) hypertension: Secondary | ICD-10-CM | POA: Diagnosis not present

## 2018-05-11 MED ORDER — ONDANSETRON HCL 4 MG/2ML IJ SOLN
4.0000 mg | Freq: Once | INTRAMUSCULAR | Status: AC
  Administered 2018-05-11: 4 mg via INTRAVENOUS
  Filled 2018-05-11: qty 2

## 2018-05-11 MED ORDER — FENTANYL CITRATE (PF) 100 MCG/2ML IJ SOLN
50.0000 ug | Freq: Once | INTRAMUSCULAR | Status: AC
  Administered 2018-05-11: 50 ug via INTRAVENOUS
  Filled 2018-05-11: qty 2

## 2018-05-11 NOTE — ED Triage Notes (Signed)
Pt from home; pt stepping down stairs, had a mechanical fall down 2nd to last step, heard a pop, did not hit head, no loc, not on thinners, no neck or back pain; deformity present on R ankle; Abrasion to L knee;10/10 pain inital; pt given 150 fentanyl total PTA, 3/10 currently  128.83 HR 86 97% RA 97.2 F

## 2018-05-11 NOTE — Progress Notes (Signed)
Orthopedic Tech Progress Note Patient Details:  Maria Bishop 04-13-1960 [id number]  Ortho Devices Type of Ortho Device: Ankle Air splint, Crutches Ortho Device/Splint Interventions: Application   Post Interventions Patient Tolerated: Well Instructions Provided: Care of device   [name] 05/11/2018, 5:52 PM

## 2018-05-11 NOTE — ED Provider Notes (Signed)
[facility] EMERGENCY DEPARTMENT Provider Note   CSN: [id number] Arrival date & time: 05/11/18  1640    History   Chief Complaint Chief Complaint  Patient presents with   Fall    HPI Maria Bishop is a 58 y.o. female who presents the emergency department complaining of right ankle pain after inversion injury of her right foot.  Patient says she was walking down steps when she missed the last 1.  She states her right ankle rolled inward however she did not fall or hit her head.  She was able to catch her self with her arms against a wall.  Patient was given a total of 150 mg of fentanyl by EMS in transit.  Patient denies any chest pain, shortness of breath, or presyncopal type symptoms preceding the fall.  She states it was purely mechanical in nature.      Illness  Severity:  Severe Onset quality:  Sudden Duration:  1 hour Timing:  Constant Progression:  Unchanged Chronicity:  New Associated symptoms: no abdominal pain, no chest pain, no cough, no ear pain, no fever, no rash, no shortness of breath, no sore throat and no vomiting     Past Medical History:  Diagnosis Date   CAD (coronary artery disease)    a. NSTEMI 2013: likely spontaneous dissection/ruptured plaque in OM3 - PCI: 3 overlapping Promus DES to OM3, EF 55% b. cath 2014: patent stents   Cervical cancer ([place])    Degenerative disc disease    Myocardial infarction ([place])    ~97yrs ago   NSTEMI (non-ST elevated myocardial infarction) ([place]) 06/24/2011   a. NSTEMI 2013: likely spontaneous dissection/ruptured plaque in OM3 - PCI: 3 overlapping Promus DES to OM3, EF 55%     Patient Active Problem List   Diagnosis Date Noted   Primary osteoarthritis of both knees 03/27/2018   Primary osteoarthritis of both hands 03/27/2018   DDD (degenerative disc disease), lumbar 03/27/2018   DDD (degenerative disc disease), cervical 03/27/2018   Vitamin D deficiency 01/24/2018   Chest pain  01/25/2016   Lumbar radiculopathy 08/06/2015   Memory loss 12/02/2014   History of cervical cancer 12/02/2014   History of MI (myocardial infarction) 12/02/2014   Acute bacterial sinusitis 10/29/2014   Insomnia 08/31/2014   GERD (gastroesophageal reflux disease) 07/08/2013   Abdominal pain, epigastric 07/08/2013   Screening for malignant neoplasm of cervix 07/19/2012   Routine general medical examination at a health care facility 07/19/2012   Depression with anxiety 06/06/2012   Hyperlipidemia 09/06/2011   CAD (coronary artery disease) 06/24/2001    Past Surgical History:  Procedure Laterality Date   APPENDECTOMY     [place] bunionectomy right  February 2015   CARDIAC CATHETERIZATION N/A 01/26/2016   Procedure: Left Heart Cath and Coronary Angiography;  Surgeon: Belva Crome, MD;  Location: [facility];  Service: Cardiovascular;  Laterality: N/A;   LEFT HEART CATHETERIZATION WITH CORONARY ANGIOGRAM N/A 06/24/2011   Procedure: LEFT HEART CATHETERIZATION WITH CORONARY ANGIOGRAM;  Surgeon: Jettie Booze, MD;  Location: [facility] CATH LAB;  Service: Cardiovascular;  Laterality: N/A;   PERCUTANEOUS CORONARY STENT INTERVENTION (PCI-S) Right 06/24/2011   Procedure: PERCUTANEOUS CORONARY STENT INTERVENTION (PCI-S);  Surgeon: Jettie Booze, MD;  Location: [facility] CATH LAB;  Service: Cardiovascular;  Laterality: Right;   TUBAL LIGATION       OB History   No obstetric history on file.      Home Medications    Prior to Admission medications  Medication Sig Start Date End Date Taking? Authorizing Provider  Alirocumab (PRALUENT) 75 MG/ML SOPN Inject 75 mg into the skin every 14 (fourteen) days. 11/08/17   Sherren Mocha, MD  ALPRAZolam Duanne Moron) 0.5 MG tablet TAKE 1 TABLET BY MOUTH 2 TIMES DAILY AS NEEDED 03/01/18   [name], MD  aspirin 81 MG tablet Take 81 mg by mouth daily.    [provider]  Aspirin-Salicylamide-Caffeine (BC HEADACHE POWDER  PO) Take by mouth as needed.    [provider]  buPROPion (WELLBUTRIN XL) 300 MG 24 hr tablet TAKE 1 TABLET BY MOUTH EVERY DAY 04/03/18   [name], MD  busPIRone (BUSPAR) 7.5 MG tablet Take 1 tablet (7.5 mg total) by mouth 2 (two) times daily. 04/29/18   [name], MD  cephALEXin (KEFLEX) 500 MG capsule Take 1 capsule (500 mg total) by mouth 2 (two) times daily for 10 doses. 05/10/18 05/15/18  [name], MD  Cholecalciferol 10 MCG (400 UNIT) CAPS Take by mouth daily.    [provider]  cyclobenzaprine (FLEXERIL) 10 MG tablet TAKE 1 TABLET BY MOUTH 3 TIMES DAILY AS NEEDED FOR MUSCLE SPASMS 05/06/18   [name], MD  fexofenadine (ALLEGRA) 180 MG tablet Take 180 mg by mouth daily. Reported on 06/02/2015    [provider]  FLUoxetine (PROZAC) 40 MG capsule TAKE 1 CAPSULE BY MOUTH EVERY DAY 02/04/18   [name], MD  ibuprofen (ADVIL,MOTRIN) 200 MG tablet Take 800 mg by mouth every 6 (six) hours as needed. For pain    [provider]  nitroGLYCERIN (NITROSTAT) 0.4 MG SL tablet Place 1 tablet (0.4 mg total) under the tongue every 5 (five) minutes as needed. 11/26/13   Sherren Mocha, MD  Omega-3 Fatty Acids (FISH OIL PO) Take 1,000 mg by mouth daily.    [provider]  pantoprazole (PROTONIX) 40 MG tablet TAKE 1 TABLET BY MOUTH EVERY DAY 05/06/18   [name], MD  traMADol (ULTRAM) 50 MG tablet TAKE 1 TABLET BY MOUTH EVERY 8 HOURS AS NEEDED 03/13/18   [name], MD  TURMERIC CURCUMIN PO Take by mouth.    [provider]  zolpidem (AMBIEN) 10 MG tablet Take 1 tablet (10 mg total) by mouth at bedtime as needed. for sleep 03/19/18   [name], MD    Family History Family History  Problem Relation Age of Onset   Alzheimer's disease Maternal Uncle    Coronary artery disease Mother        MI in her 34s   Early death Father    Brain cancer Sister    Drug abuse Son    Asthma  Daughter    Anxiety disorder Daughter     Social History Social History   Tobacco Use   Smoking status: Former Smoker    Types: Cigarettes   Smokeless tobacco: Never Used   Tobacco comment: quit over 30 years ago   Substance Use Topics   Alcohol use: Yes    Alcohol/week: 0.0 standard drinks    Comment: occ   Drug use: No     Allergies   Shellfish allergy; Statins; Zetia [ezetimibe]; and Mobic [meloxicam]   Review of Systems Review of Systems  Constitutional: Negative for chills and fever.  HENT: Negative for ear pain and sore throat.   Eyes: Negative for pain and visual disturbance.  Respiratory: Negative for cough and shortness of breath.   Cardiovascular: Negative for chest pain and  palpitations.  Gastrointestinal: Negative for abdominal pain and vomiting.  Genitourinary: Negative for dysuria and hematuria.  Musculoskeletal: Positive for arthralgias (right ankle). Negative for back pain.  Skin: Negative for color change and rash.  Neurological: Negative for seizures and syncope.  All other systems reviewed and are negative.    Physical Exam Updated Vital Signs BP 125/89    Pulse 80    Temp 98.2 F (36.8 C) (Oral)    Resp 16    Ht 5\' 2"  (1.575 m)    Wt 88.5 kg    LMP 07/05/2012    SpO2 100%    BMI 35.67 kg/m   Physical Exam Vitals signs and nursing note reviewed.  Constitutional:      General: She is not in acute distress.    Appearance: She is well-developed.  HENT:     Head: Normocephalic and atraumatic.  Eyes:     Conjunctiva/sclera: Conjunctivae normal.  Neck:     Musculoskeletal: Neck supple.  Cardiovascular:     Rate and Rhythm: Normal rate and regular rhythm.     Heart sounds: No murmur.  Pulmonary:     Effort: Pulmonary effort is normal. No respiratory distress.     Breath sounds: Normal breath sounds.  Abdominal:     Palpations: Abdomen is soft.     Tenderness: There is no abdominal tenderness.  Musculoskeletal:     Comments: Swelling  and tenderness over the lateral malleoli of the right ankle. Ankle held in slight inversion. 2+ DP pulses bilaterally. Limited plantar and dorsi flexion secondary to pain. Able to move all toes. R foot is warm and well perfused.   Skin:    General: Skin is warm and dry.  Neurological:     General: No focal deficit present.     Mental Status: She is alert and oriented to person, place, and time.      ED Treatments / Results  Labs (all labs ordered are listed, but only abnormal results are displayed) Labs Reviewed - No data to display  EKG None  Radiology Dg Tibia/fibula Right  Result Date: 05/11/2018 CLINICAL DATA:  Right ankle fracture EXAM: RIGHT TIBIA AND FIBULA - 2 VIEW COMPARISON:  Right knee x-ray 03/12/2018 FINDINGS: No acute fracture or dislocation. No aggressive osseous lesion. Mild tricompartmental osteoarthritis of right knee. Right lateral patellar marginal osteophyte. Soft tissue swelling over the lateral malleolus. IMPRESSION: No acute osseous injury of the right tibia and fibula. Electronically Signed   By: Kathreen Devoid   On: 05/11/2018 17:38   Dg Ankle Complete Right  Result Date: 05/11/2018 CLINICAL DATA:  Right ankle pain status post fall EXAM: RIGHT ANKLE - COMPLETE 3+ VIEW COMPARISON:  None. FINDINGS: No acute fracture or dislocation. No aggressive osseous lesion. Plantar calcaneal spur. Soft tissue swelling over the lateral malleolus. IMPRESSION: 1. No acute osseous injury of the right ankle severe soft tissue swelling over the lateral malleolus. Electronically Signed   By: Kathreen Devoid   On: 05/11/2018 17:36    Procedures Procedures (including critical care time)  Medications Ordered in ED Medications  ondansetron (ZOFRAN) injection 4 mg (4 mg Intravenous Given 05/11/18 1655)  fentaNYL (SUBLIMAZE) injection 50 mcg (50 mcg Intravenous Given 05/11/18 1742)     Initial Impression / Assessment and Plan / ED Course  I have reviewed the triage vital signs and the  nursing notes.  Pertinent labs & imaging results that were available during my care of the patient were reviewed by me and considered in  my medical decision making (see chart for details).        Patient is a 58 year old female who presents the emergency department complaining of radical pain after injury sustained while walking down steps.  She states that she miscalculated the last step causing inversion type injury to the right ankle.  On initial evaluation the patient she was hemodynamically stable and nontoxic-appearing.  Patient was afebrile, not tachycardic, slightly hypertensive at 138/97, satting appropriately on room air.  Physical exam as detailed above which is remarkable for swelling and tenderness of the right ankle most prominent over the lateral malleoli.  Ankle held in slight inversion.  Limited passive and active dorsiflexion/plantarflexion of the right foot secondary to pain.  2+ dorsalis pedis pulses bilaterally with warm and well-perfused foot.  Able to move all toes.   X-rays were obtained which revealed no acute fractures or malalignment of the right ankle.   Patient was given her ankle immobilizer as well as crutches while the emergency department.  I discussed with her conservative management strategies such as rest, ice, compression, and elevation.  I also discussed Tylenol and NSAIDs for pain relief.  She was instructed to contact her primary care physician or orthopedic surgeon if pain persists after returning home.  I discussed concerning signs and symptoms that would necessitate return to the emergency department.  Patient voiced understanding of these instructions and had no further questions at this time.  Final Clinical Impressions(s) / ED Diagnoses   Final diagnoses:  Sprain of right ankle, unspecified ligament, initial encounter    ED Discharge Orders    None       Tommie Raymond, MD 05/11/18 [place], [place], DO 05/11/18 [id number]

## 2018-05-11 NOTE — Progress Notes (Signed)
Orthopedic Tech Progress Note Patient Details:  Maria Bishop 09-13-1960 [id number]  Ortho Devices Type of Ortho Device: Ankle Air splint, Crutches Ortho Device/Splint Interventions: Application   Post Interventions Patient Tolerated: Well Instructions Provided: Care of device   [name] 05/11/2018, 5:53 PM

## 2018-05-11 NOTE — ED Notes (Signed)
Paged ortho 

## 2018-05-11 NOTE — ED Notes (Signed)
Pt back from X-ray.  

## 2018-05-11 NOTE — ED Notes (Signed)
Patient verbalizes understanding of discharge instructions. Opportunity for questioning and answers were provided. Pt discharged from ED. 

## 2018-06-02 ENCOUNTER — Other Ambulatory Visit: Payer: Self-pay | Admitting: Family Medicine

## 2018-06-03 ENCOUNTER — Other Ambulatory Visit: Payer: Self-pay

## 2018-06-03 NOTE — Telephone Encounter (Signed)
Last refill:03/13/18 #30, 3 Last OV:05/10/18

## 2018-06-13 ENCOUNTER — Encounter: Payer: Self-pay | Admitting: Family Medicine

## 2018-06-17 ENCOUNTER — Encounter: Payer: Self-pay | Admitting: Family Medicine

## 2018-06-17 NOTE — Progress Notes (Signed)
Letter written on pt's behalf

## 2018-06-19 ENCOUNTER — Encounter: Payer: Self-pay | Admitting: Family Medicine

## 2018-06-24 ENCOUNTER — Encounter: Payer: Self-pay | Admitting: Rheumatology

## 2018-06-25 NOTE — Telephone Encounter (Signed)
I would recommend stress evaluation by a spine specialist.  If patient does not have one we can refer her to Dr. Louanne Skye

## 2018-07-03 ENCOUNTER — Other Ambulatory Visit: Payer: Self-pay | Admitting: Family Medicine

## 2018-07-03 NOTE — Telephone Encounter (Signed)
Last OV 05/10/18 Flexeril last filled 06/03/18 #30 with 0

## 2018-07-09 ENCOUNTER — Other Ambulatory Visit: Payer: Self-pay | Admitting: Family Medicine

## 2018-07-09 NOTE — Telephone Encounter (Signed)
Last OV 05/10/18 Alprazolam last filled 03/01/18 #30 with 3

## 2018-07-15 ENCOUNTER — Other Ambulatory Visit: Payer: Self-pay | Admitting: Family Medicine

## 2018-07-15 DIAGNOSIS — F5101 Primary insomnia: Secondary | ICD-10-CM

## 2018-07-15 NOTE — Telephone Encounter (Signed)
Last OV 05/10/18 Zolpidem last filled 03/19/18 #30 with 3

## 2018-07-24 ENCOUNTER — Other Ambulatory Visit: Payer: Self-pay | Admitting: Family Medicine

## 2018-07-24 DIAGNOSIS — Z1211 Encounter for screening for malignant neoplasm of colon: Secondary | ICD-10-CM

## 2018-08-01 ENCOUNTER — Encounter (INDEPENDENT_AMBULATORY_CARE_PROVIDER_SITE_OTHER): Payer: 59 | Admitting: Family Medicine

## 2018-08-01 DIAGNOSIS — K120 Recurrent oral aphthae: Secondary | ICD-10-CM

## 2018-08-05 MED ORDER — LIDOCAINE VISCOUS HCL 2 % MT SOLN: OROMUCOSAL | 0 refills | Status: DC

## 2018-08-05 NOTE — Telephone Encounter (Signed)
Cumulative Time: 7 Consent: Pt reached out via MyChart and then agreed People Involved: pt, Jess B (CMA) myself CC: mouth ulcers HPI: pt reports she has had 4 ulcers of over a week and would like something to improve pain.  Having difficulty eating. AP: aphthous ulcers- start topical viscous lidocaine (prescription sent) and add daily Lysine supplement.

## 2018-08-06 ENCOUNTER — Other Ambulatory Visit: Payer: Self-pay | Admitting: Family Medicine

## 2018-08-16 ENCOUNTER — Other Ambulatory Visit: Payer: Self-pay | Admitting: Family Medicine

## 2018-08-20 ENCOUNTER — Telehealth: Payer: Self-pay | Admitting: *Deleted

## 2018-08-20 MED ORDER — PRALUENT 75 MG/ML ~~LOC~~ SOAJ: 1.0000 "pen " | SUBCUTANEOUS | 11 refills | Status: DC

## 2018-08-20 NOTE — Telephone Encounter (Signed)
Patient left a msg on the refill vm requesting a new rx for the praluent be sent to [facility]. Patient can be reached at [phone]. Thanks, MI

## 2018-08-20 NOTE — Telephone Encounter (Signed)
Refill sent in

## 2018-08-30 NOTE — Telephone Encounter (Signed)
Praluent PA approved through 08/29/19.

## 2018-09-02 ENCOUNTER — Other Ambulatory Visit: Payer: Self-pay | Admitting: Family Medicine

## 2018-09-05 ENCOUNTER — Other Ambulatory Visit: Payer: Self-pay | Admitting: Cardiovascular Disease

## 2018-09-05 NOTE — Telephone Encounter (Signed)
Pt needs to call the pharmacy, we already sent in a refill 2 weeks ago. Left message for pt with pharmacy # to call.

## 2018-09-05 NOTE — Telephone Encounter (Signed)
° ° °  Pt c/o medication issue:  1. Name of Medication: Alirocumab (Lantana) 75 MG/ML SOAJ  2. How are you currently taking this medication (dosage and times per day)? Inject 1 pen every 14 days  3. Are you having a reaction (difficulty breathing--STAT)? no  4. What is your medication issue? Patient has not received her next set of medication from her mail order pharmacy yet. She only has one injection left.  She will be contacting the mail order pharmacy today

## 2018-09-09 ENCOUNTER — Other Ambulatory Visit: Payer: Self-pay | Admitting: Family Medicine

## 2018-09-09 NOTE — Telephone Encounter (Signed)
Last OV 05/10/18 Tramadol last filled 06/03/18 #30 with 3

## 2018-09-12 ENCOUNTER — Encounter: Payer: Self-pay | Admitting: Family Medicine

## 2018-09-13 ENCOUNTER — Other Ambulatory Visit: Payer: Self-pay | Admitting: *Deleted

## 2018-09-13 DIAGNOSIS — Z20822 Contact with and (suspected) exposure to covid-19: Secondary | ICD-10-CM

## 2018-09-13 NOTE — Telephone Encounter (Signed)
Ok to give her information for COVID testing site. No order needed.  In terms of UTI concerns she will have to schedule virtual visit since Dr. Birdie Riddle is out of office.

## 2018-09-13 NOTE — Telephone Encounter (Signed)
That is a tougher one. If she is completely asymptomatic then I think it is just a discussion for her and her family. If she is going with only members of her household who have been around her this whole time, then I would say they can go ahead but need to social distance from others where they are going.

## 2018-09-15 LAB — NOVEL CORONAVIRUS, NAA: SARS-CoV-2, NAA: NOT DETECTED

## 2018-09-17 ENCOUNTER — Ambulatory Visit (INDEPENDENT_AMBULATORY_CARE_PROVIDER_SITE_OTHER): Payer: 59 | Admitting: Physician Assistant

## 2018-09-17 ENCOUNTER — Other Ambulatory Visit: Payer: Self-pay

## 2018-09-17 ENCOUNTER — Encounter: Payer: Self-pay | Admitting: Physician Assistant

## 2018-09-17 VITALS — BP 123/80 | Temp 98.8°F

## 2018-09-17 DIAGNOSIS — R109 Unspecified abdominal pain: Secondary | ICD-10-CM | POA: Diagnosis not present

## 2018-09-17 MED ORDER — SULFAMETHOXAZOLE-TRIMETHOPRIM 800-160 MG PO TABS: 1.0000 | ORAL_TABLET | Freq: Two times a day (BID) | ORAL | 0 refills | Status: DC

## 2018-09-17 NOTE — Progress Notes (Signed)
I have discussed the procedure for the virtual visit with the patient who has given consent to proceed with assessment and treatment.   Yeshua Stryker S Chrislyn Seedorf, CMA     

## 2018-09-17 NOTE — Progress Notes (Signed)
Virtual Visit via Video   I connected with patient on 09/17/18 at  2:30 PM EDT by a video enabled telemedicine application and verified that I am speaking with the correct person using two identifiers.  Location patient: Home Location provider: [facility], Office Persons participating in the virtual visit: Patient, Provider, [place] (Patina Moore)  I discussed the limitations of evaluation and management by telemedicine and the availability of in person appointments. The patient expressed understanding and agreed to proceed.  Subjective:   HPI:   Patient presents via [facility] today c/o 3 days of R back pain, some lower back pain but mainly R flank pain currently. Was mild in nature at onset. Did not take anything and went about regular business. Pain is throbbing and stabbing in nature. Denies numbness, tingling, weakness of legs. Denies saddle anesthesia. Has been dealing with constipation for a few months. Has had regular BM the past two days with some improvement in symptoms but pain still present. Laying improves symptoms. Has alternates ice and heat would improve symptoms but not resolve. Has taken some OTC Back Aid without resolution. Notes improvement with Ibuprofen. End of Last week had some urinary urgency and frequency with dysuria. Notes increasing hydration with improvement in symptoms overall but then flank pain started. Denies hematuria. Denies fever, chills, vomiting.  ROS:   See pertinent positives and negatives per HPI.  Patient Active Problem List   Diagnosis Date Noted  . Primary osteoarthritis of both knees 03/27/2018  . Primary osteoarthritis of both hands 03/27/2018  . DDD (degenerative disc disease), lumbar 03/27/2018  . DDD (degenerative disc disease), cervical 03/27/2018  . Vitamin D deficiency 01/24/2018  . Chest pain 01/25/2016  . Lumbar radiculopathy 08/06/2015  . Memory loss 12/02/2014  . History of cervical cancer 12/02/2014  . History of MI  (myocardial infarction) 12/02/2014  . Acute bacterial sinusitis 10/29/2014  . Insomnia 08/31/2014  . GERD (gastroesophageal reflux disease) 07/08/2013  . Abdominal pain, epigastric 07/08/2013  . Screening for malignant neoplasm of cervix 07/19/2012  . Routine general medical examination at a health care facility 07/19/2012  . Depression with anxiety 06/06/2012  . Hyperlipidemia 09/06/2011  . CAD (coronary artery disease) 06/24/2001    Social History   Tobacco Use  . Smoking status: Former Smoker    Types: Cigarettes  . Smokeless tobacco: Never Used  . Tobacco comment: quit over 30 years ago   Substance Use Topics  . Alcohol use: Yes    Alcohol/week: 0.0 standard drinks    Comment: occ    Current Outpatient Medications:  .  Alirocumab (PRALUENT) 75 MG/ML SOAJ, Inject 1 pen into the skin every 14 (fourteen) days., Disp: 2 pen, Rfl: 11 .  ALPRAZolam (XANAX) 0.5 MG tablet, TAKE 1 TABLET BY MOUTH 2 TIMES DAILY AS NEEDED, Disp: 30 tablet, Rfl: 3 .  aspirin 81 MG tablet, Take 81 mg by mouth daily., Disp: , Rfl:  .  Aspirin-Salicylamide-Caffeine (BC HEADACHE POWDER PO), Take by mouth as needed., Disp: , Rfl:  .  buPROPion (WELLBUTRIN XL) 300 MG 24 hr tablet, TAKE 1 TABLET BY MOUTH EVERY DAY, Disp: 30 tablet, Rfl: 3 .  busPIRone (BUSPAR) 7.5 MG tablet, TAKE 1 TABLET BY MOUTH 2 TIMES DAILY, Disp: 60 tablet, Rfl: 3 .  Cholecalciferol 10 MCG (400 UNIT) CAPS, Take by mouth daily., Disp: , Rfl:  .  cyclobenzaprine (FLEXERIL) 10 MG tablet, TAKE 1 TABLET BY MOUTH 3 TIMES DAILY AS NEEDED FOR MUSCLE SPASMS, Disp: 30 tablet, Rfl: 0 .  fexofenadine (ALLEGRA) 180 MG tablet, Take 180 mg by mouth daily. Reported on 06/02/2015, Disp: , Rfl:  .  FLUoxetine (PROZAC) 40 MG capsule, TAKE 1 CAPSULE BY MOUTH EVERY DAY, Disp: 90 capsule, Rfl: 0 .  ibuprofen (ADVIL,MOTRIN) 200 MG tablet, Take 800 mg by mouth every 6 (six) hours as needed. For pain, Disp: , Rfl:  .  lidocaine (XYLOCAINE) 2 % solution, Apply  directly to surface of ulcer every 4-6 hrs as needed OR 5 ml swish and spit, Disp: 100 mL, Rfl: 0 .  nitroGLYCERIN (NITROSTAT) 0.4 MG SL tablet, Place 1 tablet (0.4 mg total) under the tongue every 5 (five) minutes as needed., Disp: 25 tablet, Rfl: 3 .  Omega-3 Fatty Acids (FISH OIL PO), Take 1,000 mg by mouth daily., Disp: , Rfl:  .  pantoprazole (PROTONIX) 40 MG tablet, TAKE 1 TABLET BY MOUTH EVERY DAY, Disp: 90 tablet, Rfl: 0 .  traMADol (ULTRAM) 50 MG tablet, TAKE 1 TABLET BY MOUTH EVERY 8 HOURS AS NEEDED, Disp: 30 tablet, Rfl: 3 .  TURMERIC CURCUMIN PO, Take by mouth., Disp: , Rfl:  .  zolpidem (AMBIEN) 10 MG tablet, TAKE 1 TABLET BY MOUTH NIGHTLY AT BEDTIME AS NEEDED SLEEP, Disp: 30 tablet, Rfl: 3  Allergies  Allergen Reactions  . Shellfish Allergy   . Statins     Failed Crestor 5 mg qd, Simvastatin 40 mg, and Lipitor 40 mg, and pravastatin 10 mg qd due to muscle and joint pain  . Zetia [Ezetimibe]     Depression and constipation per patient  . Mobic [Meloxicam] Rash    Objective:   BP 123/80   Temp 98.8 F (37.1 C) (Oral)   LMP 07/05/2012   Patient is well-developed, well-nourished in no acute distress.  Resting comfortably at home.  Head is normocephalic, atraumatic.  No labored breathing.  Speech is clear and coherent with logical content.  Patient is alert and oriented at baseline.   Assessment and Plan:   1. Acute flank pain Giving longstanding history of back issues in patient with this being a "new pain" in absence of trauma, no improvement with muscle relaxant and heat, recent urinary symptoms untreated -- concern for mild pyelonephritis. Will start Bactrim. She is scheduled to come in tomorrow Am for urine testing and further assessment. Pain relief reviewed with patient. Strict ER precautions given.  - sulfamethoxazole-trimethoprim (BACTRIM DS) 800-160 MG tablet; Take 1 tablet by mouth 2 (two) times daily.  Dispense: 10 tablet; Refill: 0 .   Leeanne Rio, PA-C 09/17/2018

## 2018-09-17 NOTE — Patient Instructions (Signed)
Please keep well hydrated. Continue your bowel regimen to keep things stable in that department. Start the antibiotic and take as directed. Continue Tramadol and Ibuprofen for pain. Rest.  Please come in tomorrow morning as scheduled for urine testing and further assessment.  ER for any acutely worsening symptoms overnight.

## 2018-09-18 ENCOUNTER — Other Ambulatory Visit: Payer: 59

## 2018-09-18 ENCOUNTER — Ambulatory Visit (INDEPENDENT_AMBULATORY_CARE_PROVIDER_SITE_OTHER): Payer: 59

## 2018-09-18 ENCOUNTER — Other Ambulatory Visit: Payer: Self-pay | Admitting: Physician Assistant

## 2018-09-18 ENCOUNTER — Other Ambulatory Visit: Payer: Self-pay

## 2018-09-18 DIAGNOSIS — R109 Unspecified abdominal pain: Secondary | ICD-10-CM | POA: Diagnosis not present

## 2018-09-18 DIAGNOSIS — R300 Dysuria: Secondary | ICD-10-CM

## 2018-09-18 DIAGNOSIS — K5900 Constipation, unspecified: Secondary | ICD-10-CM | POA: Diagnosis not present

## 2018-09-18 LAB — POCT URINALYSIS DIPSTICK
Bilirubin, UA: NEGATIVE
Blood, UA: NEGATIVE
Glucose, UA: NEGATIVE
Ketones, UA: NEGATIVE
Leukocytes, UA: NEGATIVE
Nitrite, UA: NEGATIVE
Protein, UA: NEGATIVE
Spec Grav, UA: 1.015 (ref 1.010–1.025)
Urobilinogen, UA: 0.2 E.U./dL
pH, UA: 6 (ref 5.0–8.0)

## 2018-09-19 ENCOUNTER — Encounter: Payer: Self-pay | Admitting: Family Medicine

## 2018-09-20 LAB — URINE CULTURE
MICRO NUMBER:: 764269
Result:: NO GROWTH
SPECIMEN QUALITY:: ADEQUATE

## 2018-09-20 NOTE — Progress Notes (Signed)
Urine culture negative for growth. How are symptoms currently? If improving with medication given, would have her still complete entire course

## 2018-09-22 ENCOUNTER — Encounter (HOSPITAL_COMMUNITY): Payer: Self-pay | Admitting: Emergency Medicine

## 2018-09-22 ENCOUNTER — Emergency Department (HOSPITAL_COMMUNITY)
Admission: EM | Admit: 2018-09-22 | Discharge: 2018-09-22 | Disposition: A | Discharge status: Home / Self Care | Payer: 59 | Attending: Emergency Medicine | Admitting: Emergency Medicine

## 2018-09-22 ENCOUNTER — Other Ambulatory Visit: Payer: Self-pay

## 2018-09-22 ENCOUNTER — Emergency Department (HOSPITAL_COMMUNITY): Payer: 59

## 2018-09-22 DIAGNOSIS — Z7982 Long term (current) use of aspirin: Secondary | ICD-10-CM | POA: Diagnosis not present

## 2018-09-22 DIAGNOSIS — Z87891 Personal history of nicotine dependence: Secondary | ICD-10-CM | POA: Insufficient documentation

## 2018-09-22 DIAGNOSIS — R1011 Right upper quadrant pain: Secondary | ICD-10-CM | POA: Diagnosis present

## 2018-09-22 DIAGNOSIS — Z79899 Other long term (current) drug therapy: Secondary | ICD-10-CM | POA: Insufficient documentation

## 2018-09-22 DIAGNOSIS — R109 Unspecified abdominal pain: Secondary | ICD-10-CM

## 2018-09-22 DIAGNOSIS — I2510 Atherosclerotic heart disease of native coronary artery without angina pectoris: Secondary | ICD-10-CM | POA: Diagnosis not present

## 2018-09-22 DIAGNOSIS — Z8541 Personal history of malignant neoplasm of cervix uteri: Secondary | ICD-10-CM | POA: Diagnosis not present

## 2018-09-22 LAB — COMPREHENSIVE METABOLIC PANEL
ALT: 16 U/L (ref 0–44)
AST: 20 U/L (ref 15–41)
Albumin: 4.4 g/dL (ref 3.5–5.0)
Alkaline Phosphatase: 64 U/L (ref 38–126)
Anion gap: 12 (ref 5–15)
BUN: 16 mg/dL (ref 6–20)
CO2: 21 mmol/L — ABNORMAL LOW (ref 22–32)
Calcium: 9.2 mg/dL (ref 8.9–10.3)
Chloride: 102 mmol/L (ref 98–111)
Creatinine, Ser: 1.05 mg/dL — ABNORMAL HIGH (ref 0.44–1.00)
GFR calc Af Amer: >60 mL/min (ref >60)
GFR calc non Af Amer: 60 mL/min — ABNORMAL LOW (ref >60)
Glucose, Bld: 148 mg/dL — ABNORMAL HIGH (ref 70–99)
Potassium: 3.8 mmol/L (ref 3.5–5.1)
Sodium: 135 mmol/L (ref 135–145)
Total Bilirubin: 0.6 mg/dL (ref 0.3–1.2)
Total Protein: 7 g/dL (ref 6.5–8.1)

## 2018-09-22 LAB — CBC
HCT: 40.1 % (ref 36.0–46.0)
Hemoglobin: 13.6 g/dL (ref 12.0–15.0)
MCH: 30.6 pg (ref 26.0–34.0)
MCHC: 33.9 g/dL (ref 30.0–36.0)
MCV: 90.3 fL (ref 80.0–100.0)
Platelets: 239 10*3/uL (ref 150–400)
RBC: 4.44 MIL/uL (ref 3.87–5.11)
RDW: 12.9 % (ref 11.5–15.5)
WBC: 8.6 10*3/uL (ref 4.0–10.5)
nRBC: 0 % (ref 0.0–0.2)

## 2018-09-22 LAB — URINALYSIS, ROUTINE W REFLEX MICROSCOPIC
Bilirubin Urine: NEGATIVE
Glucose, UA: NEGATIVE mg/dL
Hgb urine dipstick: NEGATIVE
Ketones, ur: 20 mg/dL — AB
Leukocytes,Ua: NEGATIVE
Nitrite: NEGATIVE
Protein, ur: NEGATIVE mg/dL
Specific Gravity, Urine: 1.019 (ref 1.005–1.030)
pH: 5 (ref 5.0–8.0)

## 2018-09-22 LAB — LIPASE, BLOOD: Lipase: 29 U/L (ref 11–51)

## 2018-09-22 MED ORDER — HYDROCODONE-ACETAMINOPHEN 5-325 MG PO TABS: 1.0000 | ORAL_TABLET | Freq: Four times a day (QID) | ORAL | 0 refills | Status: DC | PRN

## 2018-09-22 MED ORDER — SODIUM CHLORIDE 0.9% FLUSH: 3.0000 mL | Freq: Once | INTRAVENOUS | Status: DC

## 2018-09-22 MED ORDER — FENTANYL CITRATE (PF) 100 MCG/2ML IJ SOLN
100.0000 ug | Freq: Once | INTRAMUSCULAR | Status: AC
  Administered 2018-09-22: 100 ug via INTRAVENOUS
  Filled 2018-09-22: qty 2

## 2018-09-22 MED ORDER — ONDANSETRON HCL 4 MG/2ML IJ SOLN
4.0000 mg | Freq: Once | INTRAMUSCULAR | Status: AC
  Administered 2018-09-22: 4 mg via INTRAVENOUS
  Filled 2018-09-22: qty 2

## 2018-09-22 MED ORDER — IOHEXOL 300 MG/ML  SOLN
100.0000 mL | Freq: Once | INTRAMUSCULAR | Status: AC | PRN
  Administered 2018-09-22: 100 mL via INTRAVENOUS

## 2018-09-22 NOTE — ED Provider Notes (Signed)
[facility] EMERGENCY DEPARTMENT Provider Note   CSN: [id number] Arrival date & time: 09/22/18  0009     History   Chief Complaint Chief Complaint  Patient presents with   Abdominal Pain    HPI Maria Bishop is a 58 y.o. female.     The history is provided by the patient.  Abdominal Pain Pain location:  R flank and RUQ Pain quality: aching   Pain severity:  Moderate Onset quality:  Gradual Duration:  5 days Timing:  Intermittent Progression:  Worsening Chronicity:  New Relieved by:  Nothing Worsened by:  Movement and palpation Associated symptoms: nausea and vomiting   Associated symptoms: no chest pain, no dysuria, no fever, no hematemesis and no hematochezia    Patient history of CAD presents with right flank and right upper quadrant pain.  She reports for over 5 days she has had intermittent pain in both locations.  No fever.  She is now having nausea vomiting.  No active chest pain. Denies urinary symptoms. She had tele-visit with her PCP, and was treated for presumed pyelonephritis with Bactrim She also had a abdominal plain film and was negative for acute process  Reports pain became worse tonight. She reports alternating constipation and diarrhea. Past Medical History:  Diagnosis Date   CAD (coronary artery disease)    a. NSTEMI 2013: likely spontaneous dissection/ruptured plaque in OM3 - PCI: 3 overlapping Promus DES to OM3, EF 55% b. cath 2014: patent stents   Cervical cancer ([place])    Degenerative disc disease    Myocardial infarction ([place])    ~104yrs ago   NSTEMI (non-ST elevated myocardial infarction) ([place]) 06/24/2011   a. NSTEMI 2013: likely spontaneous dissection/ruptured plaque in OM3 - PCI: 3 overlapping Promus DES to OM3, EF 55%     Patient Active Problem List   Diagnosis Date Noted   Primary osteoarthritis of both knees 03/27/2018   Primary osteoarthritis of both hands 03/27/2018   DDD (degenerative disc  disease), lumbar 03/27/2018   DDD (degenerative disc disease), cervical 03/27/2018   Vitamin D deficiency 01/24/2018   Chest pain 01/25/2016   Lumbar radiculopathy 08/06/2015   Memory loss 12/02/2014   History of cervical cancer 12/02/2014   History of MI (myocardial infarction) 12/02/2014   Acute bacterial sinusitis 10/29/2014   Insomnia 08/31/2014   GERD (gastroesophageal reflux disease) 07/08/2013   Abdominal pain, epigastric 07/08/2013   Screening for malignant neoplasm of cervix 07/19/2012   Routine general medical examination at a health care facility 07/19/2012   Depression with anxiety 06/06/2012   Hyperlipidemia 09/06/2011   CAD (coronary artery disease) 06/24/2001    Past Surgical History:  Procedure Laterality Date   APPENDECTOMY     [place] bunionectomy right  February 2015   CARDIAC CATHETERIZATION N/A 01/26/2016   Procedure: Left Heart Cath and Coronary Angiography;  Surgeon: Belva Crome, MD;  Location: [facility];  Service: Cardiovascular;  Laterality: N/A;   LEFT HEART CATHETERIZATION WITH CORONARY ANGIOGRAM N/A 06/24/2011   Procedure: LEFT HEART CATHETERIZATION WITH CORONARY ANGIOGRAM;  Surgeon: Jettie Booze, MD;  Location: [facility] CATH LAB;  Service: Cardiovascular;  Laterality: N/A;   PERCUTANEOUS CORONARY STENT INTERVENTION (PCI-S) Right 06/24/2011   Procedure: PERCUTANEOUS CORONARY STENT INTERVENTION (PCI-S);  Surgeon: Jettie Booze, MD;  Location: [facility] CATH LAB;  Service: Cardiovascular;  Laterality: Right;   TUBAL LIGATION       OB History   No obstetric history on file.      Home  Medications    Prior to Admission medications   Medication Sig Start Date End Date Taking? Authorizing Provider  Alirocumab (PRALUENT) 75 MG/ML SOAJ Inject 1 pen into the skin every 14 (fourteen) days. 08/20/18   Sherren Mocha, MD  ALPRAZolam Duanne Moron) 0.5 MG tablet TAKE 1 TABLET BY MOUTH 2 TIMES DAILY AS NEEDED 07/09/18   [name],  MD  aspirin 81 MG tablet Take 81 mg by mouth daily.    [provider]  Aspirin-Salicylamide-Caffeine (BC HEADACHE POWDER PO) Take by mouth as needed.    [provider]  buPROPion (WELLBUTRIN XL) 300 MG 24 hr tablet TAKE 1 TABLET BY MOUTH EVERY DAY 08/06/18   [name], MD  busPIRone (BUSPAR) 7.5 MG tablet TAKE 1 TABLET BY MOUTH 2 TIMES DAILY 07/25/18   [name], MD  Cholecalciferol 10 MCG (400 UNIT) CAPS Take by mouth daily.    [provider]  cyclobenzaprine (FLEXERIL) 10 MG tablet TAKE 1 TABLET BY MOUTH 3 TIMES DAILY AS NEEDED FOR MUSCLE SPASMS 08/16/18   [name], MD  fexofenadine (ALLEGRA) 180 MG tablet Take 180 mg by mouth daily. Reported on 06/02/2015    [provider]  FLUoxetine (PROZAC) 40 MG capsule TAKE 1 CAPSULE BY MOUTH EVERY DAY 09/02/18   [name], MD  ibuprofen (ADVIL,MOTRIN) 200 MG tablet Take 800 mg by mouth every 6 (six) hours as needed. For pain    [provider]  lidocaine (XYLOCAINE) 2 % solution Apply directly to surface of ulcer every 4-6 hrs as needed OR 5 ml swish and spit 08/05/18   [name], MD  nitroGLYCERIN (NITROSTAT) 0.4 MG SL tablet Place 1 tablet (0.4 mg total) under the tongue every 5 (five) minutes as needed. 11/26/13   Sherren Mocha, MD  Omega-3 Fatty Acids (FISH OIL PO) Take 1,000 mg by mouth daily.    [provider]  pantoprazole (PROTONIX) 40 MG tablet TAKE 1 TABLET BY MOUTH EVERY DAY 09/02/18   [name], MD  sulfamethoxazole-trimethoprim (BACTRIM DS) 800-160 MG tablet Take 1 tablet by mouth 2 (two) times daily. 09/17/18   Brunetta Jeans, PA-C  traMADol (ULTRAM) 50 MG tablet TAKE 1 TABLET BY MOUTH EVERY 8 HOURS AS NEEDED 09/09/18   [name], MD  TURMERIC CURCUMIN PO Take by mouth.    [provider]  zolpidem (AMBIEN) 10 MG tablet TAKE 1 TABLET BY MOUTH NIGHTLY AT BEDTIME AS NEEDED SLEEP 07/15/18   [name], MD     Family History Family History  Problem Relation Age of Onset   Alzheimer's disease Maternal Uncle    Coronary artery disease Mother        MI in her 58s   Early death Father    Brain cancer Sister    Drug abuse Son    Asthma Daughter    Anxiety disorder Daughter     Social History Social History   Tobacco Use   Smoking status: Former Smoker    Types: Cigarettes   Smokeless tobacco: Never Used   Tobacco comment: quit over 30 years ago   Substance Use Topics   Alcohol use: Yes    Alcohol/week: 0.0 standard drinks    Comment: occ   Drug use: No     Allergies   Shellfish allergy, Statins, Zetia [ezetimibe], and Mobic [meloxicam]   Review of Systems Review of Systems  Constitutional: Negative for fever.  Cardiovascular: Negative for chest pain.  Gastrointestinal: Positive  for abdominal pain, nausea and vomiting. Negative for blood in stool, hematemesis and hematochezia.  Genitourinary: Negative for dysuria.  All other systems reviewed and are negative.    Physical Exam Updated Vital Signs BP (!) 177/102 (BP Location: Left Arm)    Pulse 89    Temp 98 F (36.7 C) (Oral)    Resp 18    Ht 1.575 m (5\' 2" )    Wt 88 kg    LMP 07/05/2012    SpO2 100%    BMI 35.48 kg/m   Physical Exam CONSTITUTIONAL: Well developed/well nourished HEAD: Normocephalic/atraumatic EYES: EOMI/PERRL ENMT: Mucous membranes moist NECK: supple no meningeal signs SPINE/BACK:entire spine nontender CV: S1/S2 noted, no murmurs/rubs/gallops noted LUNGS: Lungs are clear to auscultation bilaterally, no apparent distress ABDOMEN: soft, moderate RUQ tenderness, no rebound or guarding, bowel sounds noted throughout abdomen GU: Right Cva tenderness NEURO: Pt is awake/alert/appropriate, moves all extremitiesx4.  No facial droop.   EXTREMITIES: pulses normal/equal, full ROM SKIN: warm, color normal no rash noted to abdomen or back PSYCH: no abnormalities of mood noted, alert and oriented  to situation   ED Treatments / Results  Labs (all labs ordered are listed, but only abnormal results are displayed) Labs Reviewed  COMPREHENSIVE METABOLIC PANEL - Abnormal; Notable for the following components:      Result Value   CO2 21 (*)    Glucose, Bld 148 (*)    Creatinine, Ser 1.05 (*)    GFR calc non Af Amer 60 (*)    All other components within normal limits  URINALYSIS, ROUTINE W REFLEX MICROSCOPIC - Abnormal; Notable for the following components:   Ketones, ur 20 (*)    All other components within normal limits  LIPASE, BLOOD  CBC    EKG None  Radiology Ct Abdomen Pelvis W Contrast  Result Date: 09/22/2018 CLINICAL DATA:  Left abdominal and flank pain for several days. EXAM: CT ABDOMEN AND PELVIS WITH CONTRAST TECHNIQUE: Multidetector CT imaging of the abdomen and pelvis was performed using the standard protocol following bolus administration of intravenous contrast. CONTRAST:  123mL OMNIPAQUE IOHEXOL 300 MG/ML  SOLN COMPARISON:  None. FINDINGS: Lower Chest: 5 mm pulmonary nodule in the right middle lobe on image 5/4. Hepatobiliary: No hepatic masses identified. Gallbladder is unremarkable. No evidence of biliary ductal dilatation. Pancreas:  No mass or inflammatory changes. Spleen: Within normal limits in size and appearance. Adrenals/Urinary Tract: No masses identified. No evidence of hydronephrosis. Unremarkable unopacified urinary bladder. Stomach/Bowel: No evidence of obstruction, inflammatory process or abnormal fluid collections. Vascular/Lymphatic: No pathologically enlarged lymph nodes. No abdominal aortic aneurysm. Reproductive: No mass or other significant abnormality. Clips from previous bilateral tubal ligation noted. Other:  None. Musculoskeletal:  No suspicious bone lesions identified. IMPRESSION: 1. No acute findings or other significant abnormality within the abdomen or pelvis. 2. 5 mm indeterminate right middle lobe pulmonary nodule. No follow-up needed if  patient is low-risk. Non-contrast chest CT can be considered in 12 months if patient is high-risk. This recommendation follows the consensus statement: Guidelines for Management of Incidental Pulmonary Nodules Detected on CT Images: From the Fleischner Society 2017; Radiology 2017; 284:228-243. Electronically Signed   By: Marlaine Hind M.D.   On: 09/22/2018 05:19    Procedures Procedures    Medications Ordered in ED Medications  sodium chloride flush (NS) 0.9 % injection 3 mL (has no administration in time range)  fentaNYL (SUBLIMAZE) injection 100 mcg (has no administration in time range)  ondansetron (ZOFRAN) injection 4 mg (has  no administration in time range)     Initial Impression / Assessment and Plan / ED Course  I have reviewed the triage vital signs and the nursing notes.  Pertinent labs & imaging results that were available during my care of the patient were reviewed by me and considered in my medical decision making (see chart for details).        3:47 AM Patient presents with right flank and RUQ tenderness for the past 5 days.  She has already been treated with Bactrim for presumed pyelonephritis as an outpatient We will proceed with CT scan of abdomen/pelvis 6:22 AM CT scan negative for acute process.  Patient overall appears improved.  No focal abdominal tenderness.  Still has some right flank pain.  Low suspicion for biliary colic at this time.  She does have pulmonary nodule will need follow-up as an outpatient.  Short course of pain medicine was sent.  She will follow with her PCP if her back pain continues Final Clinical Impressions(s) / ED Diagnoses   Final diagnoses:  Right upper quadrant abdominal pain  Right flank pain    ED Discharge Orders         Ordered    HYDROcodone-acetaminophen (NORCO/VICODIN) 5-325 MG tablet  Every 6 hours PRN     09/22/18 0603           Ripley Fraise, MD 09/22/18 [phone]

## 2018-09-22 NOTE — ED Triage Notes (Signed)
Pt c/o L flank and L abd pain x several days, pt states she was seen by PCP last week dx with a "fecal mass" but no obstruction. Pt states pain worse, unrelieved by treatments recommended.

## 2018-09-22 NOTE — ED Notes (Signed)
Pt. Stated to tech/WH,NT3, that pt. Had vomited in the bathroom, after vomiting, pt. had felt better and did not have current back pain that was stated from before.

## 2018-09-22 NOTE — ED Notes (Signed)
Pt ambulated to bathroom 

## 2018-09-22 NOTE — ED Provider Notes (Signed)
EKG Interpretation  Date/Time:  Sunday September 22 2018 04:09:10 EDT Ventricular Rate:  112 PR Interval:    QRS Duration: 89 QT Interval:  338 QTC Calculation: 462 R Axis:   60 Text Interpretation:  Sinus tachycardia Ventricular premature complex Aberrant complex Low voltage, precordial leads rate is faster on this EKG Confirmed by Ripley Fraise [phone]) on 09/22/2018 4:24:11 AM       Patient informed of pulmonary nodule.  She will follow with her PCP about this.  She may need CT chest in 1 year   Ripley Fraise, MD 09/22/18 [phone]

## 2018-09-22 NOTE — ED Notes (Signed)
The pt has had constipation for a long time  She has had more difficulty with abd pain and  Constipation for several days today her pain is worse than usual she cannot get comfortable

## 2018-09-22 NOTE — Discharge Instructions (Addendum)
  SEEK IMMEDIATE MEDICAL ATTENTION IF: The pain does not go away or becomes severe, particularly over the next 8-12 hours.  A temperature above 100.4F develops.  Repeated vomiting occurs (multiple episodes).  The pain becomes localized to portions of the abdomen.  Blood is being passed in stools or vomit (bright red or black tarry stools).  Return also if you develop chest pain, difficulty breathing, dizziness or fainting, or become confused, poorly responsive, or inconsolable.  

## 2018-09-23 ENCOUNTER — Other Ambulatory Visit: Payer: Self-pay | Admitting: Physician Assistant

## 2018-09-23 ENCOUNTER — Encounter: Payer: Self-pay | Admitting: Physician Assistant

## 2018-09-23 DIAGNOSIS — K5900 Constipation, unspecified: Secondary | ICD-10-CM

## 2018-09-23 DIAGNOSIS — R1011 Right upper quadrant pain: Secondary | ICD-10-CM

## 2018-09-24 ENCOUNTER — Encounter: Payer: Self-pay | Admitting: Physician Assistant

## 2018-09-27 ENCOUNTER — Other Ambulatory Visit: Payer: Self-pay | Admitting: Family Medicine

## 2018-09-27 NOTE — Telephone Encounter (Signed)
Juliann Pulse, Pharmacist with [facility] [phone] called regarding these prescriptions. She states the patient is also on Tramadol and there is a drug interaction between the Xanax & Tramadol. She is asking for clarification on why patient is on those medications together and is it okay to fill like this.

## 2018-09-30 NOTE — Telephone Encounter (Signed)
Tramadol is for her chronic low back pain and alprazolam is for anxiety.  She has taken these medications w/o difficulty in the past.  Ok to fill both

## 2018-09-30 NOTE — Telephone Encounter (Signed)
Called and left a detailed message giving PCP approval to fill medications.

## 2018-10-09 ENCOUNTER — Telehealth: Payer: Self-pay | Admitting: *Deleted

## 2018-10-09 NOTE — Telephone Encounter (Signed)
Juliann Pulse with [facility] called regarding patient's medications. She states that patient is on several controlled substances and on 8/16 she received hydrocodone from an ED doctor that was filled at a different pharmacy.   Pharmacist states that the sleeping pills, pain pills, anxiety pills and muscle relaxer puts her at a much greater risk for overdose.  She wanted PCP to be aware of this as she feels like her chart needs to be noted - especially since she took the pain medication to a different pharmacy.

## 2018-10-09 NOTE — Telephone Encounter (Signed)
Aware of pharmacy's concern.  She was seen in ER on a Sunday and her usual pharmacy was not open.  Will continue to monitor.

## 2018-10-30 ENCOUNTER — Encounter: Payer: Self-pay | Admitting: Family Medicine

## 2018-10-31 ENCOUNTER — Other Ambulatory Visit: Payer: Self-pay | Admitting: Family Medicine

## 2018-11-01 ENCOUNTER — Encounter: Payer: Self-pay | Admitting: Physician Assistant

## 2018-11-01 ENCOUNTER — Other Ambulatory Visit: Payer: Self-pay

## 2018-11-01 ENCOUNTER — Ambulatory Visit (INDEPENDENT_AMBULATORY_CARE_PROVIDER_SITE_OTHER): Payer: 59

## 2018-11-01 ENCOUNTER — Ambulatory Visit (INDEPENDENT_AMBULATORY_CARE_PROVIDER_SITE_OTHER): Payer: 59 | Admitting: Physician Assistant

## 2018-11-01 DIAGNOSIS — R300 Dysuria: Secondary | ICD-10-CM

## 2018-11-01 DIAGNOSIS — R350 Frequency of micturition: Secondary | ICD-10-CM

## 2018-11-01 LAB — POCT URINALYSIS DIPSTICK
Bilirubin, UA: NEGATIVE
Blood, UA: NEGATIVE
Glucose, UA: NEGATIVE
Ketones, UA: NEGATIVE
Leukocytes, UA: NEGATIVE
Nitrite, UA: NEGATIVE
Protein, UA: NEGATIVE
Spec Grav, UA: 1.02 (ref 1.010–1.025)
Urobilinogen, UA: 0.2 E.U./dL
pH, UA: 6 (ref 5.0–8.0)

## 2018-11-01 MED ORDER — SULFAMETHOXAZOLE-TRIMETHOPRIM 800-160 MG PO TABS: 1.0000 | ORAL_TABLET | Freq: Two times a day (BID) | ORAL | 0 refills | Status: DC

## 2018-11-01 NOTE — Progress Notes (Signed)
I have discussed the procedure for the virtual visit with the patient who has given consent to proceed with assessment and treatment.   Aldwin Micalizzi S Lorris Carducci, CMA     

## 2018-11-01 NOTE — Patient Instructions (Signed)
Your symptoms are consistent with a bladder infection, also called acute cystitis. Please take your antibiotic (Bactrim) as directed until all pills are gone.  Stay very well hydrated.  Consider a daily probiotic (Align, Culturelle, or Activia) to help prevent stomach upset caused by the antibiotic.  Taking a probiotic daily may also help prevent recurrent UTIs.  Also consider taking AZO (Phenazopyridine) tablets to help decrease pain with urination.  I will call you with your urine testing results.  We will change antibiotics if indicated.  Call or return to clinic if symptoms are not resolved by completion of antibiotic.   Urinary Tract Infection A urinary tract infection (UTI) can occur any place along the urinary tract. The tract includes the kidneys, ureters, bladder, and urethra. A type of germ called bacteria often causes a UTI. UTIs are often helped with antibiotic medicine.  HOME CARE   If given, take antibiotics as told by your doctor. Finish them even if you start to feel better.  Drink enough fluids to keep your pee (urine) clear or pale yellow.  Avoid tea, drinks with caffeine, and bubbly (carbonated) drinks.  Pee often. Avoid holding your pee in for a long time.  Pee before and after having sex (intercourse).  Wipe from front to back after you poop (bowel movement) if you are a woman. Use each tissue only once. GET HELP RIGHT AWAY IF:   You have back pain.  You have lower belly (abdominal) pain.  You have chills.  You feel sick to your stomach (nauseous).  You throw up (vomit).  Your burning or discomfort with peeing does not go away.  You have a fever.  Your symptoms are not better in 3 days. MAKE SURE YOU:   Understand these instructions.  Will watch your condition.  Will get help right away if you are not doing well or get worse. Document Released: 07/12/2007 Document Revised: 10/18/2011 Document Reviewed: 08/24/2011 [facility] Patient Information 2015  [place], [place]. This information is not intended to replace advice given to you by your health care provider. Make sure you discuss any questions you have with your health care provider.   Eating Plan for Interstitial Cystitis Interstitial cystitis (IC) is a long-term (chronic) condition that causes pain and pressure in the bladder, the lower abdomen, and the pelvic area. Other symptoms of IC include urinary urgency and frequency. Symptoms tend to come and go. Many people with IC find that certain foods trigger their symptoms. Different foods may be problematic for different people. Some foods are more likely to cause symptoms than others. Learning which foods bother you and which do not can help you come up with an eating plan to manage IC. What are tips for following this plan? You may find it helpful to work with a dietitian. This health care provider can help you develop your eating plan by doing an elimination diet, which involves these steps:  Start with a list of foods that you think trigger your IC symptoms along with the foods that most commonly trigger symptoms for many people with IC.  Eliminate those foods from your diet for about one month, then start reintroducing the foods one at a time to see which ones trigger your symptoms.  Make a list of the foods that trigger your symptoms. It may take several months to find out which foods bother you. Reading food labels Once you know which foods trigger your IC symptoms, you can avoid them. However, it is also a good idea  to read food labels because some foods that trigger your symptoms may be included as ingredients in other foods. These ingredients may include:  Chili peppers.  Tomato products.  Soy.  Worcestershire sauce.  Vinegar.  Alcohol.  Citrus flavors or juices.  Artificial sweeteners.  Monosodium glutamate. Shopping  Shopping can be a challenge if many foods trigger your IC. When you go grocery shopping, bring a  list of the foods you can eat.  You can get an app for your phone that lets you know which foods are the safest and which you may want to avoid. You can find the app at the Interstitial Cystitis Network website: www.ic-network.com Meal planning  Plan your meals according to the results of your elimination diet. If you have not done an elimination diet, plan meals according to IC food lists recommended by your health care provider or dietitian. These lists tell you which foods are least and most likely to cause symptoms.  Avoid certain types of food when you go out to eat, such as pizza and foods typically served at [place], [place], and [place]. These foods often contain ingredients that can aggravate IC. General information Here are some general guidelines for an IC eating plan:  Do not eat large portions.  Drink plenty of fluids with your meals.  Do not eat foods that are high in sugar, salt, or saturated fat.  Choose whole fruits instead of juice.  Eat a colorful variety of vegetables. What foods should I eat? For people with IC, the best diet is a balanced one that includes things from all the food groups. Even if you have to avoid certain foods, there are still plenty of healthy choices in each group. The following are some foods that are least bothersome and may be safest to eat: Fruits Bananas. Blueberries and blueberry juice. Melons. Pears. Apples. Dates. Prunes. Raisins. Apricots. Vegetables Asparagus. Avocado. Celery. Beets. Bell peppers. Black olives. Broccoli. Brussels sprouts. Cabbage. Carrots. Cauliflower. Cucumber. Eggplant. Green beans. Potatoes. Radishes. Spinach. Squash. Turnips. Zucchini. Mushrooms. Peas. Grains Oats. Rice. Bran. Oatmeal. Whole wheat bread. Meats and other proteins Beef. Fish and other seafood. Eggs. Nuts. Peanut butter. Pork. Poultry. Lamb. Garbanzo beans. Pinto beans. Dairy Whole or low-fat milk. American, mozzarella, mild cheddar, feta,  ricotta, and cream cheeses. The items listed above may not be a complete list of foods and beverages you can eat. Contact a dietitian for more information. What foods should I avoid? You should avoid any foods that seem to trigger your symptoms. It is also a good idea to avoid foods that are most likely to cause symptoms in many people with IC. These include the following: Fruits Citrus fruits, including lemons, limes, oranges, and grapefruit. Cranberries. Strawberries. Pineapple. Kiwi. Vegetables Chili peppers. Onions. Sauerkraut. Tomato and tomato products. Angie Fava. Grains You do not need to avoid any type of grain unless it triggers your symptoms. Meats and other proteins Precooked or cured meats, such as sausages or meat loaves. Soy products. Dairy Chocolate ice cream. Processed cheese. Yogurt. Beverages Alcohol. Chocolate drinks. Coffee. Cranberry juice. Carbonated drinks. Tea (black, green, or herbal). Tomato juice. Sports drinks. The items listed above may not be a complete list of foods and beverages you should avoid. Contact a dietitian for more information. Summary  Many people with IC find that certain foods trigger their symptoms. Different foods may be problematic for different people. Some foods are more likely to cause symptoms than others.  You may find it helpful to work with  a dietitian to do an elimination diet and come up with an eating plan that is right for you.  Plan your meals according to the results of your elimination diet. If you have not done an elimination diet, plan your meals using IC food lists. These lists tell you which foods are least and most likely to cause symptoms.  The best diet for people with IC is a balanced diet that includes foods from all the food groups. Even if you have to avoid certain foods, there are still plenty of healthy choices in each group. This information is not intended to replace advice given to you by your health care provider.  Make sure you discuss any questions you have with your health care provider. Document Released: 09/27/2017 Document Revised: 05/16/2018 Document Reviewed: 09/27/2017 Elsevier Patient Education  2020 [employer].

## 2018-11-01 NOTE — Progress Notes (Signed)
Virtual Visit via Video   I connected with patient on 11/01/18 at  4:00 PM EDT by a video enabled telemedicine application and verified that I am speaking with the correct person using two identifiers.  Location patient: Home Location provider: [facility] Persons participating in the virtual visit: Patient, Provider, [place] (Patina Moore)  I discussed the limitations of evaluation and management by telemedicine and the availability of in person appointments. The patient expressed understanding and agreed to proceed.  Subjective:   HPI:   Patient presents via Doxy[employer]Me today c/o 1 week of urinary urgency and suprapubic pressure. Denies dysuria, hematuria, low back pain, flank pain or pelvic pain. Notes foul-smelling odor of urine. AZO improves symptoms. Denies vaginal pain, pressure or discharge.   ROS:   See pertinent positives and negatives per HPI.  Patient Active Problem List   Diagnosis Date Noted  . Primary osteoarthritis of both knees 03/27/2018  . Primary osteoarthritis of both hands 03/27/2018  . DDD (degenerative disc disease), lumbar 03/27/2018  . DDD (degenerative disc disease), cervical 03/27/2018  . Vitamin D deficiency 01/24/2018  . Chest pain 01/25/2016  . Lumbar radiculopathy 08/06/2015  . Memory loss 12/02/2014  . History of cervical cancer 12/02/2014  . History of MI (myocardial infarction) 12/02/2014  . Acute bacterial sinusitis 10/29/2014  . Insomnia 08/31/2014  . GERD (gastroesophageal reflux disease) 07/08/2013  . Abdominal pain, epigastric 07/08/2013  . Screening for malignant neoplasm of cervix 07/19/2012  . Routine general medical examination at a health care facility 07/19/2012  . Depression with anxiety 06/06/2012  . Hyperlipidemia 09/06/2011  . CAD (coronary artery disease) 06/24/2001    Social History   Tobacco Use  . Smoking status: Former Smoker    Types: Cigarettes  . Smokeless tobacco: Never Used  . Tobacco comment:  quit over 30 years ago   Substance Use Topics  . Alcohol use: Yes    Alcohol/week: 0.0 standard drinks    Comment: occ    Current Outpatient Medications:  .  Alirocumab (PRALUENT) 75 MG/ML SOAJ, Inject 1 pen into the skin every 14 (fourteen) days., Disp: 2 pen, Rfl: 11 .  ALPRAZolam (XANAX) 0.5 MG tablet, TAKE 1 TABLET BY MOUTH 2 TIMES DAILY AS NEEDED, Disp: 30 tablet, Rfl: 3 .  aspirin 81 MG tablet, Take 81 mg by mouth daily., Disp: , Rfl:  .  Aspirin-Salicylamide-Caffeine (BC HEADACHE POWDER PO), Take by mouth as needed., Disp: , Rfl:  .  buPROPion (WELLBUTRIN XL) 300 MG 24 hr tablet, TAKE 1 TABLET BY MOUTH EVERY DAY, Disp: 30 tablet, Rfl: 3 .  busPIRone (BUSPAR) 7.5 MG tablet, TAKE 1 TABLET BY MOUTH 2 TIMES DAILY, Disp: 60 tablet, Rfl: 3 .  Cholecalciferol 10 MCG (400 UNIT) CAPS, Take by mouth daily., Disp: , Rfl:  .  cyclobenzaprine (FLEXERIL) 10 MG tablet, TAKE 1 TABLET BY MOUTH 3 TIMES DAILY AS NEEDED FOR MUSCLE SPASMS, Disp: 30 tablet, Rfl: 0 .  fexofenadine (ALLEGRA) 180 MG tablet, Take 180 mg by mouth daily. Reported on 06/02/2015, Disp: , Rfl:  .  FLUoxetine (PROZAC) 40 MG capsule, TAKE 1 CAPSULE BY MOUTH EVERY DAY, Disp: 90 capsule, Rfl: 0 .  ibuprofen (ADVIL,MOTRIN) 200 MG tablet, Take 800 mg by mouth every 6 (six) hours as needed. For pain, Disp: , Rfl:  .  nitroGLYCERIN (NITROSTAT) 0.4 MG SL tablet, Place 1 tablet (0.4 mg total) under the tongue every 5 (five) minutes as needed., Disp: 25 tablet, Rfl: 3 .  Omega-3 Fatty Acids (  FISH OIL PO), Take 1,000 mg by mouth daily., Disp: , Rfl:  .  pantoprazole (PROTONIX) 40 MG tablet, TAKE 1 TABLET BY MOUTH EVERY DAY, Disp: 90 tablet, Rfl: 0 .  traMADol (ULTRAM) 50 MG tablet, TAKE 1 TABLET BY MOUTH EVERY 8 HOURS AS NEEDED, Disp: 30 tablet, Rfl: 3 .  TURMERIC CURCUMIN PO, Take by mouth., Disp: , Rfl:  .  zolpidem (AMBIEN) 10 MG tablet, TAKE 1 TABLET BY MOUTH NIGHTLY AT BEDTIME AS NEEDED SLEEP, Disp: 30 tablet, Rfl: 3  Allergies  Allergen  Reactions  . Shellfish Allergy   . Statins     Failed Crestor 5 mg qd, Simvastatin 40 mg, and Lipitor 40 mg, and pravastatin 10 mg qd due to muscle and joint pain  . Zetia [Ezetimibe]     Depression and constipation per patient  . Mobic [Meloxicam] Rash    Objective:   LMP 07/05/2012   Patient is well-developed, well-nourished in no acute distress.  Resting comfortably at home.  Head is normocephalic, atraumatic.  No labored breathing.  Speech is clear and coherent with logical content.  Patient is alert and oriented at baseline.   Assessment and Plan:   1. Urinary frequency Suspect UTI versus IC based on symptoms. Urine dip negative but she has had this in the past with + cultures. Culture sent. Will start Bactrim. Increase fluids. Dietary recommendations reviewed with patient. Will alter regimen based on culture results.  Leeanne Rio, PA-C 11/01/2018

## 2018-11-03 LAB — URINE CULTURE
MICRO NUMBER:: 925628
SPECIMEN QUALITY:: ADEQUATE

## 2018-11-11 ENCOUNTER — Other Ambulatory Visit: Payer: Self-pay | Admitting: Family Medicine

## 2018-11-11 DIAGNOSIS — F5101 Primary insomnia: Secondary | ICD-10-CM

## 2018-11-11 NOTE — Telephone Encounter (Signed)
Last refill: 6.8.20 #30, 3 Last OV: 9.25.20 dx. UTI

## 2018-11-12 ENCOUNTER — Encounter: Payer: Self-pay | Admitting: Physician Assistant

## 2018-11-12 ENCOUNTER — Encounter: Payer: Self-pay | Admitting: Family Medicine

## 2018-11-12 DIAGNOSIS — R300 Dysuria: Secondary | ICD-10-CM

## 2018-11-12 MED ORDER — CEFDINIR 300 MG PO CAPS: 300.0000 mg | ORAL_CAPSULE | Freq: Two times a day (BID) | ORAL | 0 refills | Status: DC

## 2018-11-14 ENCOUNTER — Encounter: Payer: Self-pay | Admitting: Physician Assistant

## 2018-11-14 MED ORDER — NITROFURANTOIN MONOHYD MACRO 100 MG PO CAPS: 100.0000 mg | ORAL_CAPSULE | Freq: Two times a day (BID) | ORAL | 0 refills | Status: DC

## 2018-11-22 ENCOUNTER — Encounter (INDEPENDENT_AMBULATORY_CARE_PROVIDER_SITE_OTHER): Payer: 59 | Admitting: Family Medicine

## 2018-11-22 DIAGNOSIS — N390 Urinary tract infection, site not specified: Secondary | ICD-10-CM

## 2018-11-22 MED ORDER — CIPROFLOXACIN HCL 500 MG PO TABS: 500.0000 mg | ORAL_TABLET | Freq: Two times a day (BID) | ORAL | 0 refills | Status: DC

## 2018-11-22 NOTE — Telephone Encounter (Signed)
Cumulative Time: spent in chart review and tx 7 minutes Consent: Pt reached out via MyChart and consent was obtained to continue People Involved: Pt, Jess B (CMA), myself CC: pt reports ongoing sxs of UTI HPI: pt had OV on 9/25 for dysuria and was found to have UTI based on UA and cx.  Was treated w/ Macrobid which was sensitive and appropriate.  Pt reports that sxs have persisted after completion of abx and she is now having to use AZO due to pai. AP: persistent UTI.  Considering she is still having sxs will switch to Cipro.  If no improvement, pt will need repeat OV to assess UA and repeat cx.

## 2018-11-28 ENCOUNTER — Encounter: Payer: Self-pay | Admitting: Family Medicine

## 2018-11-29 ENCOUNTER — Other Ambulatory Visit: Payer: Self-pay | Admitting: General Practice

## 2018-11-29 MED ORDER — FLUCONAZOLE 150 MG PO TABS: 150.0000 mg | ORAL_TABLET | Freq: Once | ORAL | 0 refills | Status: AC

## 2018-12-06 ENCOUNTER — Other Ambulatory Visit: Payer: Self-pay | Admitting: Family Medicine

## 2018-12-30 ENCOUNTER — Other Ambulatory Visit: Payer: Self-pay | Admitting: Family Medicine

## 2018-12-30 NOTE — Telephone Encounter (Signed)
Last OV 11/01/18 Alprazolam last filled 09/30/18 #30 with 3

## 2019-01-07 ENCOUNTER — Encounter: Payer: Self-pay | Admitting: Family Medicine

## 2019-01-09 ENCOUNTER — Other Ambulatory Visit: Payer: Self-pay | Admitting: Family Medicine

## 2019-01-09 NOTE — Telephone Encounter (Signed)
Last OV 11/01/18 Tramadol last filled 09/09/18 #30 with 3

## 2019-01-22 ENCOUNTER — Encounter: Payer: Self-pay | Admitting: Family Medicine

## 2019-02-05 ENCOUNTER — Ambulatory Visit (INDEPENDENT_AMBULATORY_CARE_PROVIDER_SITE_OTHER): Payer: 59 | Admitting: Family Medicine

## 2019-02-05 ENCOUNTER — Encounter: Payer: Self-pay | Admitting: Family Medicine

## 2019-02-05 ENCOUNTER — Other Ambulatory Visit: Payer: Self-pay

## 2019-02-05 VITALS — BP 134/70 | HR 92 | Temp 98.2°F | Resp 16 | Ht 62.0 in | Wt 202.4 lb

## 2019-02-05 DIAGNOSIS — F1010 Alcohol abuse, uncomplicated: Secondary | ICD-10-CM | POA: Diagnosis not present

## 2019-02-05 DIAGNOSIS — M5410 Radiculopathy, site unspecified: Secondary | ICD-10-CM | POA: Diagnosis not present

## 2019-02-05 DIAGNOSIS — Z23 Encounter for immunization: Secondary | ICD-10-CM

## 2019-02-05 DIAGNOSIS — E559 Vitamin D deficiency, unspecified: Secondary | ICD-10-CM | POA: Diagnosis not present

## 2019-02-05 DIAGNOSIS — Z1211 Encounter for screening for malignant neoplasm of colon: Secondary | ICD-10-CM

## 2019-02-05 DIAGNOSIS — Z0000 Encounter for general adult medical examination without abnormal findings: Secondary | ICD-10-CM

## 2019-02-05 DIAGNOSIS — E669 Obesity, unspecified: Secondary | ICD-10-CM | POA: Diagnosis not present

## 2019-02-05 NOTE — Patient Instructions (Addendum)
Follow up in 6 months to recheck cholesterol and weight loss progress We'll notify you of your lab results and make any changes if needed Continue to work on healthy diet and regular exercise- you can do it! Call and schedule your mammogram Complete and return the cologuard as directed We'll call you with your Ortho appt for the numbness/tingling Increase your water intake Try and decrease your alcohol intake Call with any questions or concerns Stay Safe!  Stay Healthy! Happy New Year!

## 2019-02-05 NOTE — Progress Notes (Signed)
   Subjective:    Patient ID: Maria Bishop, female    DOB: 19-Sep-1960, 58 y.o.   MRN: [id number]  HPI CPE- due for mammo (pt to schedule), cologuard.  Will get flu shot today.  Pt has gained 7 lbs since last visit.  No regular exercise due to pain.  + alcohol abuse- pt will drink 2 glasses-1 bottle wine, beer 2-8, 2-3 mixed drinks.  Drinking most days of the week.  Pt doesn't feel need to cut back.   Review of Systems Patient reports no vision/ hearing changes, adenopathy,fever, weight change,  persistant/recurrent hoarseness,, chest pain, palpitations, edema, persistant/recurrent cough, hemoptysis, dyspnea (rest/exertional/paroxysmal nocturnal), gastrointestinal bleeding (melena, rectal bleeding), abdominal pain, significant heartburn, bowel changes, GU symptoms (dysuria, hematuria, incontinence), Gyn symptoms (abnormal  bleeding, pain),  syncope, focal weakness, memory loss, skin/hair/nail changes, abnormal bruising or bleeding, anxiety, or depression.   + numbness/tingling of arms and legs, hands and feet.  Occurring daily.  Occurs at rest and w/ activity.  Improves w/ tramadol and muscle relaxers.  Has hx of degeneration of cervical spine and lumbar spine.  + intermittent dysphagia due to dryness  This visit occurred during the SARS-CoV-2 public health emergency.  Safety protocols were in place, including screening questions prior to the visit, additional usage of staff PPE, and extensive cleaning of exam room while observing appropriate contact time as indicated for disinfecting solutions.       Objective:   Physical Exam General Appearance:    Alert, cooperative, no distress, appears stated age  Head:    Normocephalic, without obvious abnormality, atraumatic  Eyes:    PERRL, conjunctiva/corneas clear, EOM's intact, fundi    benign, both eyes  Ears:    Normal TM's and external ear canals, both ears  Nose:   Deferred due to COVID  Throat:   Neck:   Supple, symmetrical, trachea  midline, no adenopathy;    Thyroid: no enlargement/tenderness/nodules  Back:     Symmetric, no curvature, ROM normal, no CVA tenderness  Lungs:     Clear to auscultation bilaterally, respirations unlabored  Chest Wall:    No tenderness or deformity   Heart:    Regular rate and rhythm, S1 and S2 normal, no murmur, rub   or gallop  Breast Exam:    Deferred to GYN  Abdomen:     Soft, non-tender, bowel sounds active all four quadrants,    no masses, no organomegaly  Genitalia:    Deferred to GYN  Rectal:    Extremities:   Extremities normal, atraumatic, no cyanosis or edema  Pulses:   2+ and symmetric all extremities  Skin:   Skin color, texture, turgor normal, no rashes or lesions  Lymph nodes:   Cervical, supraclavicular, and axillary nodes normal  Neurologic:   CNII-XII intact, normal strength, sensation and reflexes    throughout          Assessment & Plan:  Alcohol abuse- pt is drinking daily and excessively.  She does not see it as a problem nor feel the need to cut back.  Discussed possible adverse effects- liver damage, nutritional deficiencies, weight gain, mood disorders.  Check thiamine, B12, folate.  Encouraged her to decrease her consumption.  Will follow.  Radiculopathy- refer to ortho for complete evaluation and tx.

## 2019-02-09 LAB — HEPATIC FUNCTION PANEL
AG Ratio: 2 (calc) (ref 1.0–2.5)
ALT: 16 U/L (ref 6–29)
AST: 19 U/L (ref 10–35)
Albumin: 4.5 g/dL (ref 3.6–5.1)
Alkaline phosphatase (APISO): 80 U/L (ref 37–153)
Bilirubin, Direct: 0.1 mg/dL (ref 0.0–0.2)
Globulin: 2.2 g/dL (calc) (ref 1.9–3.7)
Indirect Bilirubin: 0.5 mg/dL (calc) (ref 0.2–1.2)
Total Bilirubin: 0.6 mg/dL (ref 0.2–1.2)
Total Protein: 6.7 g/dL (ref 6.1–8.1)

## 2019-02-09 LAB — BASIC METABOLIC PANEL
BUN: 13 mg/dL (ref 7–25)
CO2: 26 mmol/L (ref 20–32)
Calcium: 9.5 mg/dL (ref 8.6–10.4)
Chloride: 102 mmol/L (ref 98–110)
Creat: 0.84 mg/dL (ref 0.50–1.05)
Glucose, Bld: 79 mg/dL (ref 65–99)
Potassium: 4 mmol/L (ref 3.5–5.3)
Sodium: 138 mmol/L (ref 135–146)

## 2019-02-09 LAB — CBC WITH DIFFERENTIAL/PLATELET
Absolute Monocytes: 324 cells/uL (ref 200–950)
Basophils Absolute: 0 cells/uL (ref 0–200)
Basophils Relative: 0 %
Eosinophils Absolute: 113 cells/uL (ref 15–500)
Eosinophils Relative: 2.5 %
HCT: 40.7 % (ref 35.0–45.0)
Hemoglobin: 13.9 g/dL (ref 11.7–15.5)
Lymphs Abs: 1404 cells/uL (ref 850–3900)
MCH: 31 pg (ref 27.0–33.0)
MCHC: 34.2 g/dL (ref 32.0–36.0)
MCV: 90.8 fL (ref 80.0–100.0)
MPV: 10.3 fL (ref 7.5–12.5)
Monocytes Relative: 7.2 %
Neutro Abs: 2660 cells/uL (ref 1500–7800)
Neutrophils Relative %: 59.1 %
Platelets: 224 10*3/uL (ref 140–400)
RBC: 4.48 10*6/uL (ref 3.80–5.10)
RDW: 13.2 % (ref 11.0–15.0)
Total Lymphocyte: 31.2 %
WBC: 4.5 10*3/uL (ref 3.8–10.8)

## 2019-02-09 LAB — VITAMIN D 25 HYDROXY (VIT D DEFICIENCY, FRACTURES): Vit D, 25-Hydroxy: 37 ng/mL (ref 30–100)

## 2019-02-09 LAB — LIPID PANEL
Cholesterol: 167 mg/dL (ref <200)
HDL: 43 mg/dL — ABNORMAL LOW (ref >=50)
LDL Cholesterol (Calc): 102 mg/dL (calc) — ABNORMAL HIGH
Non-HDL Cholesterol (Calc): 124 mg/dL (calc) (ref <130)
Total CHOL/HDL Ratio: 3.9 (calc) (ref <5.0)
Triglycerides: 128 mg/dL (ref <150)

## 2019-02-09 LAB — B12 AND FOLATE PANEL
Folate: 10.7 ng/mL
Vitamin B-12: 322 pg/mL (ref 200–1100)

## 2019-02-09 LAB — TSH: TSH: 2.31 mIU/L (ref 0.40–4.50)

## 2019-02-09 LAB — VITAMIN B1: Vitamin B1 (Thiamine): 11 nmol/L (ref 8–30)

## 2019-02-10 ENCOUNTER — Other Ambulatory Visit: Payer: Self-pay | Admitting: Family Medicine

## 2019-02-10 ENCOUNTER — Encounter: Payer: Self-pay | Admitting: General Practice

## 2019-02-11 ENCOUNTER — Ambulatory Visit: Payer: 59 | Admitting: Family Medicine

## 2019-02-11 ENCOUNTER — Other Ambulatory Visit: Payer: Self-pay

## 2019-02-11 ENCOUNTER — Encounter: Payer: Self-pay | Admitting: Family Medicine

## 2019-02-11 DIAGNOSIS — M5440 Lumbago with sciatica, unspecified side: Secondary | ICD-10-CM | POA: Diagnosis not present

## 2019-02-11 DIAGNOSIS — R202 Paresthesia of skin: Secondary | ICD-10-CM

## 2019-02-11 DIAGNOSIS — G8929 Other chronic pain: Secondary | ICD-10-CM

## 2019-02-11 DIAGNOSIS — M542 Cervicalgia: Secondary | ICD-10-CM | POA: Diagnosis not present

## 2019-02-11 DIAGNOSIS — R200 Anesthesia of skin: Secondary | ICD-10-CM | POA: Diagnosis not present

## 2019-02-11 MED ORDER — GABAPENTIN 100 MG PO CAPS: ORAL_CAPSULE | ORAL | 3 refills | Status: DC

## 2019-02-11 NOTE — Progress Notes (Signed)
I saw and examined the patient with Dr. Mayer Masker and agree with assessment and plan as outlined.    Chronic neck and low back pain with x-rays showing DDD most pronounced at C5-6.  Having intermittent bilateral arm and leg numbness, not fitting a specific dermatomal pattern.  Had normal labs recently, screening for medical causes of neuropathy.  Has chronic long-term daily "muscle tension" headaches.  Right leg gives out intermittently due to weakness and pain.  Will try gabapentin, and refer her to [facility] PT.  Will order cervical and lumbar MRI as well.  If no clear source of symptoms found, consider brain MRI and nerve conduction studies.

## 2019-02-11 NOTE — Progress Notes (Signed)
Maria Bishop - 59 y.o. female MRN [id number]  Date of birth: Mar 02, 1960  Office Visit Note: Visit Date: 02/11/2019 PCP: [name], MD Referred by: [name], MD  Subjective: Chief Complaint  Patient presents with  . Numbness/tingling in arms/legs - worse since March 2020   HPI: Maria Bishop is a 59 y.o. female who comes in today with lower back pain, numbness in both legs and both arms.  Lower back pain with numbness/tingling in bilateral legs since March. Intermittent in nature. She has had several incidences where legs go completely numb suddenly and then it improves over a day or two. This started when she was horseback riding in March. She has also had intermittent shooting pains down lateral right leg. Right leg also gives out on occasion and she has fallen 3-4 times this year. Lower back pain improves with tramadol and muscle relaxants (takes 2 of each). During the day, she takes ibuprofen for pain. 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   Numbness in bilateral hands for the past month. Intermittent, not dependent on position of arms, hands, or neck. She has had pain in thumbs that improved by wearing wrist/thumb brace. She has had a medical workup for peripheral neuropathy with normal vit D, TSH, B12, folate, CMP, CBC.  ROS Otherwise per HPI.  Assessment & Plan: Visit Diagnoses:  1. Bilateral numbness and tingling of arms and legs   2. Cervicalgia   3. Chronic neck pain   4. Chronic bilateral low back pain with sciatica, sciatica laterality unspecified     Plan: Chronic lower back pain with mild degenerative joint disease in lumbar spine. She has moderate DDD at C5-C6 but her symptoms do not fit a cervical radicular distribution. She has had normal workup for medical cause of neuropathy. Will trial physical therapy to help with back pain in case it is due to disc herniation. Given chronicity of symptoms with severe numbness at times, will obtain  MRI of cervical and lumbar spine. Will also try gabapentin to see if this helps with pain. She also has chronic headaches; could consider brain MRI if symptoms worsen and spine MRI is normal.   Meds & Orders:  Meds ordered this encounter  Medications  . gabapentin (NEURONTIN) 100 MG capsule    Sig: 1 PO q HS, may increase to 1 PO TID if needed    Dispense:  90 capsule    Refill:  3    Orders Placed This Encounter  Procedures  . MR Cervical Spine w/o contrast  . MR Lumbar Spine w/o contrast    Follow-up: No follow-ups on file.   Procedures: No procedures performed  No notes on file   Clinical History: No specialty comments available.   She reports that she has quit smoking. Her smoking use included cigarettes. She has never used smokeless tobacco. No results for input(s): HGBA1C, LABURIC in the last 8760 hours.  Objective:  VS:  HT:    WT:   BMI:     BP:   HR: bpm  TEMP: ( )  RESP:  Physical Exam  PHYSICAL EXAM: Gen: NAD, alert, cooperative with exam, well-appearing HEENT: clear conjunctiva,  CV:  no edema, capillary refill brisk, normal rate Resp: non-labored Skin: no rashes, normal turgor  Neuro: no gross deficits.  Psych:  alert and oriented  Ortho Exam  Lumbar spine: - Inspection: no gross deformity or asymmetry, swelling or ecchymosis - Palpation: TTP over L4-L5 - ROM: full active ROM  of the lumbar spine in flexion and extension without pain - Strength: 5/5 strength of lower extremity in L4-S1 nerve root distributions b/l; normal gait - Neuro: sensation intact in the L4-S1 nerve root distribution b/l, 1+ L4 and S1 reflexes - Special testing: Negative straight leg raise  Neck/Back: - Inspection: no gross deformity or asymmetry, swelling or ecchymosis - Palpation: TTP over C5-C7 - ROM: full active ROM of the cervical spine with neck extension, rotation, flexion without pain - Strength: 5/5 wrist flexion, extension, biceps flexion. 5/5 triceps extension. Weak  grip strength bilaterally - Neuro: sensation intact in the C5-C8 nerve root distribution b/l, 2+ C5-C7 reflexes - Special testing: negative Spurling's Negative tinel's test at carpal tunnel, negative Phalen's test    Imaging: No results found.  Past Medical/Family/Surgical/Social History: Medications & Allergies reviewed per EMR, new medications updated. Patient Active Problem List   Diagnosis Date Noted  . Primary osteoarthritis of both knees 03/27/2018  . Primary osteoarthritis of both hands 03/27/2018  . DDD (degenerative disc disease), lumbar 03/27/2018  . DDD (degenerative disc disease), cervical 03/27/2018  . Vitamin D deficiency 01/24/2018  . Chest pain 01/25/2016  . Lumbar radiculopathy 08/06/2015  . Memory loss 12/02/2014  . History of cervical cancer 12/02/2014  . History of MI (myocardial infarction) 12/02/2014  . Insomnia 08/31/2014  . GERD (gastroesophageal reflux disease) 07/08/2013  . Abdominal pain, epigastric 07/08/2013  . Screening for malignant neoplasm of cervix 07/19/2012  . Routine general medical examination at a health care facility 07/19/2012  . Depression with anxiety 06/06/2012  . Hyperlipidemia 09/06/2011  . CAD (coronary artery disease) 06/24/2001   Past Medical History:  Diagnosis Date  . CAD (coronary artery disease)    a. NSTEMI 2013: likely spontaneous dissection/ruptured plaque in OM3 - PCI: 3 overlapping Promus DES to OM3, EF 55% b. cath 2014: patent stents  . Cervical cancer ([place])   . Degenerative disc disease   . Myocardial infarction ([place])    ~66yrs ago  . NSTEMI (non-ST elevated myocardial infarction) ([place]) 06/24/2011   a. NSTEMI 2013: likely spontaneous dissection/ruptured plaque in OM3 - PCI: 3 overlapping Promus DES to OM3, EF 55%    Family History  Problem Relation Age of Onset  . Alzheimer's disease Maternal Uncle   . Coronary artery disease Mother        MI in her 58s  . Early death Father   . Brain cancer Sister   . Drug  abuse Son   . Asthma Daughter   . Anxiety disorder Daughter    Past Surgical History:  Procedure Laterality Date  . APPENDECTOMY    . [place] bunionectomy right  February 2015  . CARDIAC CATHETERIZATION N/A 01/26/2016   Procedure: Left Heart Cath and Coronary Angiography;  Surgeon: Belva Crome, MD;  Location: [place] [facility];  Service: Cardiovascular;  Laterality: N/A;  . LEFT HEART CATHETERIZATION WITH CORONARY ANGIOGRAM N/A 06/24/2011   Procedure: LEFT HEART CATHETERIZATION WITH CORONARY ANGIOGRAM;  Surgeon: Jettie Booze, MD;  Location[facility] C[facility];  Service: Cardiovascular;  Laterality: N/A;  . PERCUTANEOUS CORONARY STENT INTERVENTION (PCI-S) Right 06/24/2011   Procedure: PERCUTANEOUS CORONARY STENT INTERVENTION (PCI-S);  Surgeon: Jettie Booze, MD;  Location: [facility] C[facility] LAB;  Service: Cardiovascular;  Laterality: Right;  . TUBAL LIGATION     Social History   Occupational History  . Occupation: Customer service manager  Tobacco Use  . Smoking status: Former Smoker    Types: Cigarettes  . Smokeless tobacco: Never Used  .  Tobacco comment: quit over 30 years ago   Substance and Sexual Activity  . Alcohol use: Yes    Alcohol/week: 0.0 standard drinks    Comment: daily  . Drug use: No  . Sexual activity: Yes    Birth control/protection: Condom

## 2019-02-26 ENCOUNTER — Encounter: Payer: Self-pay | Admitting: Family Medicine

## 2019-02-28 ENCOUNTER — Ambulatory Visit
Admission: RE | Admit: 2019-02-28 | Discharge: 2019-02-28 | Disposition: A | Discharge status: Home / Self Care | Payer: 59 | Source: Ambulatory Visit | Attending: Family Medicine | Admitting: Family Medicine

## 2019-02-28 ENCOUNTER — Other Ambulatory Visit: Payer: Self-pay

## 2019-02-28 ENCOUNTER — Telehealth: Payer: Self-pay | Admitting: Family Medicine

## 2019-02-28 DIAGNOSIS — M542 Cervicalgia: Secondary | ICD-10-CM

## 2019-02-28 DIAGNOSIS — R200 Anesthesia of skin: Secondary | ICD-10-CM

## 2019-02-28 DIAGNOSIS — R202 Paresthesia of skin: Secondary | ICD-10-CM

## 2019-02-28 DIAGNOSIS — G8929 Other chronic pain: Secondary | ICD-10-CM

## 2019-02-28 DIAGNOSIS — M5440 Lumbago with sciatica, unspecified side: Secondary | ICD-10-CM

## 2019-02-28 NOTE — Telephone Encounter (Signed)
Neck MRI shows some bulging discs, but nothing compressing any nerves.  Nothing to explain the arm symptoms.  Lumbar MRI shows arthritis in the facet joints on the back of the spine at L4-5 and L5-S1.  The disc at L4-5 bulges and causes possible compression of both L5 nerves (right and left), and probably explains the leg symptoms.  Have you started physical therapy?  If not, I think you should.    In addition, I am going to order nerve conduction studies of the arms to try to figure out why they are numb.

## 2019-03-03 ENCOUNTER — Other Ambulatory Visit: Payer: Self-pay | Admitting: Family Medicine

## 2019-03-04 ENCOUNTER — Encounter: Payer: Self-pay | Admitting: Family Medicine

## 2019-03-09 ENCOUNTER — Encounter: Payer: Self-pay | Admitting: Family Medicine

## 2019-03-10 ENCOUNTER — Other Ambulatory Visit: Payer: Self-pay | Admitting: Family Medicine

## 2019-03-10 ENCOUNTER — Encounter: Payer: Self-pay | Admitting: Family Medicine

## 2019-03-10 DIAGNOSIS — E669 Obesity, unspecified: Secondary | ICD-10-CM | POA: Insufficient documentation

## 2019-03-10 MED ORDER — GABAPENTIN 300 MG PO CAPS: 300.0000 mg | ORAL_CAPSULE | Freq: Three times a day (TID) | ORAL | 3 refills | Status: DC

## 2019-03-10 NOTE — Assessment & Plan Note (Signed)
Deteriorated.  Pt has gained 7 lbs since last visit.  No regular exercise due to ongoing pain.  Drinking regularly which is likely contributing to weight gain.  Check labs to risk stratify.  Will follow.

## 2019-03-10 NOTE — Assessment & Plan Note (Signed)
Check labs and replete prn. 

## 2019-03-10 NOTE — Assessment & Plan Note (Signed)
Pt's PE WNL w/ exception of obesity.  Pt to schedule mammo.  cologuard ordered.  Flu shot given.  Check labs.  Anticipatory guidance provided.

## 2019-03-11 ENCOUNTER — Encounter: Payer: Self-pay | Admitting: Neurology

## 2019-03-18 ENCOUNTER — Encounter: Payer: Self-pay | Admitting: Family Medicine

## 2019-03-19 ENCOUNTER — Encounter: Payer: Self-pay | Admitting: Family Medicine

## 2019-03-19 MED ORDER — PREGABALIN 75 MG PO CAPS: ORAL_CAPSULE | ORAL | 3 refills | Status: DC

## 2019-03-19 NOTE — Addendum Note (Signed)
Addended by: Hortencia Pilar on: 03/19/2019 11:44 AM   Modules accepted: Orders

## 2019-03-22 ENCOUNTER — Telehealth: Payer: Self-pay | Admitting: Student

## 2019-03-22 NOTE — Telephone Encounter (Signed)
   Patient called in regard to questions about Praluent. Per telephone note from 08/20/2018, "Praluent PA approved through 08/29/2019." However, patient states Pharmacy told her she has no more refills. Notified patient that I will route this message to Pharmacy so they can help with this and update patient. Patient thanked me for calling.  Darreld Mclean, PA-C 03/22/2019 1:53 PM

## 2019-03-24 ENCOUNTER — Encounter: Payer: Self-pay | Admitting: Family Medicine

## 2019-03-24 MED ORDER — PRALUENT 75 MG/ML ~~LOC~~ SOAJ: 1.0000 "pen " | SUBCUTANEOUS | 11 refills | Status: DC

## 2019-03-24 MED ORDER — FLUCONAZOLE 150 MG PO TABS: ORAL_TABLET | ORAL | 0 refills | Status: DC

## 2019-03-24 NOTE — Addendum Note (Signed)
Addended by: Kalyb Pemble E on: 03/24/2019 12:33 PM   Modules accepted: Orders

## 2019-03-24 NOTE — Telephone Encounter (Signed)
Confirmed prior Josem Kaufmann is approved through 08/29/19 with updated 2021 insurance. Agree she should not need a new rx since last rx was sent in July 2020 with a year's worth of refills. Called pt back who states she advised them of that as well but they continued to tell her she had no refills. I sent a new rx in and advised her to call us with any other issues. She verbalized understanding.

## 2019-03-25 ENCOUNTER — Other Ambulatory Visit: Payer: Self-pay

## 2019-03-25 DIAGNOSIS — R202 Paresthesia of skin: Secondary | ICD-10-CM

## 2019-03-26 ENCOUNTER — Ambulatory Visit (INDEPENDENT_AMBULATORY_CARE_PROVIDER_SITE_OTHER): Payer: 59 | Admitting: Neurology

## 2019-03-26 ENCOUNTER — Other Ambulatory Visit: Payer: Self-pay

## 2019-03-26 ENCOUNTER — Telehealth: Payer: Self-pay | Admitting: Family Medicine

## 2019-03-26 DIAGNOSIS — R202 Paresthesia of skin: Secondary | ICD-10-CM | POA: Diagnosis not present

## 2019-03-26 DIAGNOSIS — G5603 Carpal tunnel syndrome, bilateral upper limbs: Secondary | ICD-10-CM

## 2019-03-26 NOTE — Procedures (Signed)
[facility]  [address]  [place], [place] [address] Tel: [phone] Fax:  [phone] Test Date:  03/26/2019  Patient: Maria Bishop DOB: 09/05/1960 Physician: Narda Amber, DO  Sex: Female Height: 5\' 2"  Ref Phys: Eunice Blase, DO  ID#: [id number] Temp: 34.0C Technician:    Patient Complaints: This is a 59 year old female referred for evaluation of bilateral hand numbness.  NCV & EMG Findings: Extensive electrodiagnostic testing of the right upper extremity and additional studies of the left shows:  1. Right median sensory response shows prolonged latency (4.0 ms) and reduced amplitude (13.9 V).  Left median/ulnar (palm) comparison nerve showed abnormal peak latency difference (Median Palm-Ulnar Palm, 0.5 ms).  Bilateral ulnar sensory responses are within normal limits. 2. Right median motor response shows prolonged latency (4.2 ms).  The left median and bilateral ulnar motor responses are within normal limits.   3. Chronic motor axonal loss changes are seen affecting the right abductor pollicis brevis muscle, without accompanied active denervation.    Impression: 1. Right median neuropathy at or distal to the wrist (moderate) consistent with a clinical diagnosis of carpal tunnel syndrome.  2. Left median neuropathy at or distal to the wrist (very mild), consistent with a clinical diagnosis of carpal tunnel syndrome.   3. There is no evidence of a cervical radiculopathy affecting the upper extremities.   ___________________________ Narda Amber, DO    Nerve Conduction Studies Anti Sensory Summary Table   Site NR Peak (ms) Norm Peak (ms) P-T Amp (V) Norm P-T Amp  Left Median Anti Sensory (2nd Digit)  34C  Wrist    3.3 <3.6 27.7 >15  Right Median Anti Sensory (2nd Digit)  34C  Wrist    4.0 <3.6 13.9 >15  Left Ulnar Anti Sensory (5th Digit)  34C  Wrist    2.9 <3.1 30.1 >10  Right Ulnar Anti Sensory (5th Digit)  34C  Wrist    2.6 <3.1  27.5 >10   Motor Summary Table   Site NR Onset (ms) Norm Onset (ms) O-P Amp (mV) Norm O-P Amp Site1 Site2 Delta-0 (ms) Dist (cm) Vel (m/s) Norm Vel (m/s)  Left Median Motor (Abd Poll Brev)  34C  Wrist    3.2 <4.0 8.7 >6 Elbow Wrist 4.6 26.0 57 >50  Elbow    7.8  8.4         Right Median Motor (Abd Poll Brev)  34C  Wrist    4.2 <4.0 8.9 >6 Elbow Wrist 4.8 27.0 56 >50  Elbow    9.0  8.0         Left Ulnar Motor (Abd Dig Minimi)  34C  Wrist    2.0 <3.1 10.0 >7 B Elbow Wrist 3.5 21.0 60 >50  B Elbow    5.5  9.4  A Elbow B Elbow 1.8 10.0 56 >50  A Elbow    7.3  9.4         Right Ulnar Motor (Abd Dig Minimi)  34C  Wrist    1.4 <3.1 10.2 >7 B Elbow Wrist 3.5 20.0 57 >50  B Elbow    4.9  9.6  A Elbow B Elbow 1.9 10.0 53 >50  A Elbow    6.8  9.2          Comparison Summary Table   Site NR Peak (ms) Norm Peak (ms) P-T Amp (V) Site1 Site2 Delta-P (ms) Norm Delta (ms)  Left Median/Ulnar Palm Comparison (Wrist - 8cm)  34C  Median Palm    1.9 <2.2 43.3 Median Palm Ulnar Palm 0.5   Ulnar Palm    1.4 <2.2 15.1       EMG   Side Muscle Ins Act Fibs Psw Fasc Number Recrt Dur Dur. Amp Amp. Poly Poly. Comment  Right 1stDorInt Nml Nml Nml Nml Nml Nml Nml Nml Nml Nml Nml Nml N/A  Right Abd Poll Brev Nml Nml Nml Nml 1- Rapid Few 1+ Few 1+ Nml Nml N/A  Right PronatorTeres Nml Nml Nml Nml Nml Nml Nml Nml Nml Nml Nml Nml N/A  Right Biceps Nml Nml Nml Nml Nml Nml Nml Nml Nml Nml Nml Nml N/A  Right Triceps Nml Nml Nml Nml Nml Nml Nml Nml Nml Nml Nml Nml N/A  Right Deltoid Nml Nml Nml Nml Nml Nml Nml Nml Nml Nml Nml Nml N/A  Left 1stDorInt Nml Nml Nml Nml Nml Nml Nml Nml Nml Nml Nml Nml N/A  Left PronatorTeres Nml Nml Nml Nml Nml Nml Nml Nml Nml Nml Nml Nml N/A  Left Biceps Nml Nml Nml Nml Nml Nml Nml Nml Nml Nml Nml Nml N/A  Left Triceps Nml Nml Nml Nml Nml Nml Nml Nml Nml Nml Nml Nml N/A  Left Deltoid Nml Nml Nml Nml Nml Nml Nml Nml Nml Nml Nml Nml N/A      Waveforms:

## 2019-03-26 NOTE — Telephone Encounter (Signed)
Nerve studies show bilateral carpal tunnel syndrome.  No sign of nerve impingement from the neck.

## 2019-03-31 ENCOUNTER — Encounter: Payer: Self-pay | Admitting: Family Medicine

## 2019-04-07 ENCOUNTER — Other Ambulatory Visit: Payer: Self-pay | Admitting: Family Medicine

## 2019-04-07 DIAGNOSIS — F5101 Primary insomnia: Secondary | ICD-10-CM

## 2019-04-07 NOTE — Telephone Encounter (Signed)
Last OV 02/05/19 Zolpidem last filled 11/11/18 #30 with 3

## 2019-04-10 ENCOUNTER — Encounter: Payer: Self-pay | Admitting: Family Medicine

## 2019-04-11 ENCOUNTER — Other Ambulatory Visit: Payer: Self-pay

## 2019-04-11 DIAGNOSIS — G8929 Other chronic pain: Secondary | ICD-10-CM

## 2019-04-11 DIAGNOSIS — R200 Anesthesia of skin: Secondary | ICD-10-CM

## 2019-04-11 DIAGNOSIS — M542 Cervicalgia: Secondary | ICD-10-CM

## 2019-04-14 ENCOUNTER — Other Ambulatory Visit: Payer: Self-pay | Admitting: Family Medicine

## 2019-04-14 NOTE — Telephone Encounter (Signed)
Last OV 02/05/19 Alprazolam last filled 12/31/18 #30 with 3

## 2019-05-05 ENCOUNTER — Other Ambulatory Visit: Payer: Self-pay | Admitting: Family Medicine

## 2019-05-06 NOTE — Telephone Encounter (Signed)
LOV: 02/05/2019 CPE Tramadol last rx 01/09/19 #30 3 RF Flexeril last rx 04/08/19 #30

## 2019-05-12 ENCOUNTER — Encounter: Payer: Self-pay | Admitting: Family Medicine

## 2019-05-14 ENCOUNTER — Other Ambulatory Visit: Payer: Self-pay

## 2019-05-14 ENCOUNTER — Encounter: Payer: Self-pay | Admitting: Family Medicine

## 2019-05-14 ENCOUNTER — Ambulatory Visit (INDEPENDENT_AMBULATORY_CARE_PROVIDER_SITE_OTHER): Payer: 59 | Admitting: Family Medicine

## 2019-05-14 VITALS — BP 122/84 | HR 102 | Temp 98.0°F | Resp 16 | Wt 205.5 lb

## 2019-05-14 DIAGNOSIS — R682 Dry mouth, unspecified: Secondary | ICD-10-CM | POA: Diagnosis not present

## 2019-05-14 DIAGNOSIS — F418 Other specified anxiety disorders: Secondary | ICD-10-CM

## 2019-05-14 DIAGNOSIS — R42 Dizziness and giddiness: Secondary | ICD-10-CM

## 2019-05-14 DIAGNOSIS — H539 Unspecified visual disturbance: Secondary | ICD-10-CM | POA: Diagnosis not present

## 2019-05-14 MED ORDER — BUPROPION HCL ER (XL) 150 MG PO TB24: 150.0000 mg | ORAL_TABLET | Freq: Every day | ORAL | 3 refills | Status: DC

## 2019-05-14 NOTE — Patient Instructions (Signed)
We'll notify you of your lab results and determine the next step Continue to drink LOTS of fluids Change positions slowly We'll call you with your Neuro appt DECREASE the Wellbutrin (Bupropion) to 150mg  daily Call with any questions or concerns Hang in there!

## 2019-05-14 NOTE — Progress Notes (Signed)
   Subjective:    Patient ID: Maria Bishop, female    DOB: 1960-10-27, 59 y.o.   MRN: [id number]  HPI Dizziness- sxs started ~1 week ago.  Had sharp pain in R ear and converted to dull pain.  Used left over ear drops and pain resolved.  Subsequently noticed that when she looks down at keyboard or a chart things had a wavy appearance.  Only occurs when looking down.  Wears progressive lenses (not new).  No difficulty w/ turning head.  No difficulty w/ lying down.  No sensation of room spinning or nausea.  Doesn't occur each time she looks down but will occur multiple times daily and seemingly random.  Has known carpal tunnel of wrists bilaterally.  Recent eye exam WNL.  'I feel numb.  I can't even cry.  I just don't care'- currently on Wellbutrin 300mg  daily, Buspar 7.5mg  BID, and Prozac 40mg  daily.  Difficulty w/ short term memory.  'it's just everything that's been going on this past year w/ COVID'.    + dry mouth, dry eye.   Review of Systems For ROS see HPI   This visit occurred during the SARS-CoV-2 public health emergency.  Safety protocols were in place, including screening questions prior to the visit, additional usage of staff PPE, and extensive cleaning of exam room while observing appropriate contact time as indicated for disinfecting solutions.     Objective:   Physical Exam Vitals reviewed.  Constitutional:      General: She is not in acute distress.    Appearance: She is well-developed. She is obese.  HENT:     Head: Normocephalic and atraumatic.     Mouth/Throat:     Pharynx: Uvula midline.  Eyes:     Conjunctiva/sclera: Conjunctivae normal.     Pupils: Pupils are equal, round, and reactive to light.     Comments: 2-3 beats of horizontal nystagmus  Cardiovascular:     Rate and Rhythm: Normal rate and regular rhythm.     Heart sounds: Normal heart sounds.  Pulmonary:     Effort: Pulmonary effort is normal. No respiratory distress.     Breath sounds: Normal breath  sounds. No wheezing or rales.  Musculoskeletal:     Cervical back: Normal range of motion and neck supple.  Lymphadenopathy:     Cervical: No cervical adenopathy.  Skin:    General: Skin is warm and dry.  Neurological:     Mental Status: She is alert and oriented to person, place, and time.     Cranial Nerves: No cranial nerve deficit.     Deep Tendon Reflexes: Reflexes are normal and symmetric.  Psychiatric:        Behavior: Behavior normal.        Thought Content: Thought content normal.        Judgment: Judgment normal.           Assessment & Plan:  Dry eye/Dry Mouth- given her multiple symptoms and marked dry eye and mouth I am concerned for possible autoimmune disorder.  Will check ANA as well as thyroid.  Visual changes/dizziness- eye exam was WNL.  Pt reports this occurs only when looking down and not every time.  She cannot report a trigger.  Has horizontal nystagmus on PE today.  May be BPV but will get neuro referral to assess.  Pt expressed understanding and is in agreement w/ plan.

## 2019-05-14 NOTE — Assessment & Plan Note (Signed)
Deteriorated.  Pt reports feeling very flat.  Unable to cry.  I'm concerned that she may be over-medicated at this time.  We will decrease the Wellbutrin to 150mg  daily and monitor closely.  Pt expressed understanding and is in agreement w/ plan.

## 2019-05-15 ENCOUNTER — Encounter: Payer: Self-pay | Admitting: Neurology

## 2019-05-15 LAB — CBC WITH DIFFERENTIAL/PLATELET
Basophils Absolute: 0.1 10*3/uL (ref 0.0–0.1)
Basophils Relative: 1.1 % (ref 0.0–3.0)
Eosinophils Absolute: 0.2 10*3/uL (ref 0.0–0.7)
Eosinophils Relative: 3.2 % (ref 0.0–5.0)
HCT: 37.3 % (ref 36.0–46.0)
Hemoglobin: 13.1 g/dL (ref 12.0–15.0)
Lymphocytes Relative: 25.3 % (ref 12.0–46.0)
Lymphs Abs: 1.3 10*3/uL (ref 0.7–4.0)
MCHC: 35.1 g/dL (ref 30.0–36.0)
MCV: 91.3 fl (ref 78.0–100.0)
Monocytes Absolute: 0.4 10*3/uL (ref 0.1–1.0)
Monocytes Relative: 7.4 % (ref 3.0–12.0)
Neutro Abs: 3.3 10*3/uL (ref 1.4–7.7)
Neutrophils Relative %: 63 % (ref 43.0–77.0)
Platelets: 228 10*3/uL (ref 150.0–400.0)
RBC: 4.08 Mil/uL (ref 3.87–5.11)
RDW: 14.1 % (ref 11.5–15.5)
WBC: 5.3 10*3/uL (ref 4.0–10.5)

## 2019-05-15 LAB — BASIC METABOLIC PANEL
BUN: 13 mg/dL (ref 6–23)
CO2: 25 mEq/L (ref 19–32)
Calcium: 9.1 mg/dL (ref 8.4–10.5)
Chloride: 104 mEq/L (ref 96–112)
Creatinine, Ser: 0.96 mg/dL (ref 0.40–1.20)
GFR: 60.02 mL/min (ref >60.00)
Glucose, Bld: 80 mg/dL (ref 70–99)
Potassium: 4.1 mEq/L (ref 3.5–5.1)
Sodium: 139 mEq/L (ref 135–145)

## 2019-05-15 LAB — TSH: TSH: 2.76 u[IU]/mL (ref 0.35–4.50)

## 2019-05-16 LAB — ANTI-NUCLEAR AB-TITER (ANA TITER): ANA Titer 1: 1:80 {titer} — ABNORMAL HIGH

## 2019-05-16 LAB — ANA: Anti Nuclear Antibody (ANA): POSITIVE — AB

## 2019-05-19 ENCOUNTER — Other Ambulatory Visit: Payer: Self-pay | Admitting: Family Medicine

## 2019-05-19 ENCOUNTER — Other Ambulatory Visit: Payer: Self-pay | Admitting: Physician Assistant

## 2019-05-20 NOTE — Telephone Encounter (Signed)
Last OV 05/14/19 Tramadol last filled 01/09/19 #30 with 3

## 2019-05-20 NOTE — Telephone Encounter (Signed)
Will defer further refills of patient's medications to PCP  

## 2019-05-27 ENCOUNTER — Encounter: Payer: Self-pay | Admitting: Family Medicine

## 2019-06-02 ENCOUNTER — Other Ambulatory Visit: Payer: Self-pay

## 2019-06-02 ENCOUNTER — Encounter: Payer: Self-pay | Admitting: Family Medicine

## 2019-06-02 ENCOUNTER — Ambulatory Visit: Payer: 59 | Admitting: Family Medicine

## 2019-06-02 DIAGNOSIS — M25562 Pain in left knee: Secondary | ICD-10-CM

## 2019-06-02 NOTE — Progress Notes (Signed)
Office Visit Note   Patient: Maria Bishop           Date of Birth: 1960-10-11           MRN: [id number] Visit Date: 06/02/2019 Requested by: [name], MD [address] [place],  [place] [address] PCP: [name], MD  Subjective: Chief Complaint  Patient presents with  . Left Knee - Pain    HPI: She is here with left knee pain.  Pain on the medial aspect with some swelling.  Catching pain intermittently.  No locking.              ROS:   All other systems were reviewed and are negative.  Objective: Vital Signs: LMP 07/05/2012   Physical Exam:  General:  Alert and oriented, in no acute distress. Pulm:  Breathing unlabored. Psy:  Normal mood, congruent affect. Skin: No erythema Left knee: 1+ effusion with no warmth.  She is tender medial joint line, pain but no palpable click with McMurray's.  Full extension, flexion of 120 degrees.  Imaging: No results found.  Assessment & Plan: 1.  Left knee pain with effusion, suspicious for degenerative medial meniscus tear -Discussed options with her and elected to inject with cortisone.  If this does not help, then MRI scan.     Procedures: Left knee injection: After sterile prep with Betadine, injected 5 cc 1% lidocaine without epinephrine and 40 mg methylprednisolone from superolateral approach, a flash of clear yellow synovial fluid was obtained prior to injection.    PMFS History: Patient Active Problem List   Diagnosis Date Noted  . Obesity (BMI 30-39.9) 03/10/2019  . Primary osteoarthritis of both knees 03/27/2018  . Primary osteoarthritis of both hands 03/27/2018  . DDD (degenerative disc disease), lumbar 03/27/2018  . DDD (degenerative disc disease), cervical 03/27/2018  . Vitamin D deficiency 01/24/2018  . Chest pain 01/25/2016  . Lumbar radiculopathy 08/06/2015  . Memory loss 12/02/2014  . History of cervical cancer 12/02/2014  . History of MI (myocardial infarction) 12/02/2014  .  Insomnia 08/31/2014  . GERD (gastroesophageal reflux disease) 07/08/2013  . Abdominal pain, epigastric 07/08/2013  . Screening for malignant neoplasm of cervix 07/19/2012  . Routine general medical examination at a health care facility 07/19/2012  . Depression with anxiety 06/06/2012  . Hyperlipidemia 09/06/2011  . CAD (coronary artery disease) 06/24/2001   Past Medical History:  Diagnosis Date  . CAD (coronary artery disease)    a. NSTEMI 2013: likely spontaneous dissection/ruptured plaque in OM3 - PCI: 3 overlapping Promus DES to OM3, EF 55% b. cath 2014: patent stents  . Cervical cancer ([place])   . Degenerative disc disease   . Myocardial infarction ([place])    ~38yrs ago  . NSTEMI (non-ST elevated myocardial infarction) ([place]) 06/24/2011   a. NSTEMI 2013: likely spontaneous dissection/ruptured plaque in OM3 - PCI: 3 overlapping Promus DES to OM3, EF 55%     Family History  Problem Relation Age of Onset  . Alzheimer's disease Maternal Uncle   . Coronary artery disease Mother        MI in her 34s  . Early death Father   . Brain cancer Sister   . Drug abuse Son   . Asthma Daughter   . Anxiety disorder Daughter     Past Surgical History:  Procedure Laterality Date  . APPENDECTOMY    . [place] bunionectomy right  February 2015  . CARDIAC CATHETERIZATION N/A 01/26/2016   Procedure: Left  Heart Cath and Coronary Angiography;  Surgeon: Belva Crome, MD;  Location: [facility];  Service: Cardiovascular;  Laterality: N/A;  . LEFT HEART CATHETERIZATION WITH CORONARY ANGIOGRAM N/A 06/24/2011   Procedure: LEFT HEART CATHETERIZATION WITH CORONARY ANGIOGRAM;  Surgeon: Jettie Booze, MD;  Location: [facility] CATH LAB;  Service: Cardiovascular;  Laterality: N/A;  . PERCUTANEOUS CORONARY STENT INTERVENTION (PCI-S) Right 06/24/2011   Procedure: PERCUTANEOUS CORONARY STENT INTERVENTION (PCI-S);  Surgeon: Jettie Booze, MD;  Location: [facility] CATH LAB;  Service: Cardiovascular;  Laterality:  Right;  . TUBAL LIGATION     Social History   Occupational History  . Occupation: Customer service manager  Tobacco Use  . Smoking status: Former Smoker    Types: Cigarettes  . Smokeless tobacco: Never Used  . Tobacco comment: quit over 30 years ago   Substance and Sexual Activity  . Alcohol use: Yes    Alcohol/week: 0.0 standard drinks    Comment: daily  . Drug use: No  . Sexual activity: Yes    Birth control/protection: Condom

## 2019-06-02 NOTE — Progress Notes (Signed)
Left knee swelling in the last two to four weeks

## 2019-06-03 ENCOUNTER — Other Ambulatory Visit: Payer: Self-pay | Admitting: Family Medicine

## 2019-06-03 ENCOUNTER — Encounter: Payer: Self-pay | Admitting: Family Medicine

## 2019-06-03 DIAGNOSIS — Z1231 Encounter for screening mammogram for malignant neoplasm of breast: Secondary | ICD-10-CM

## 2019-06-04 ENCOUNTER — Telehealth (INDEPENDENT_AMBULATORY_CARE_PROVIDER_SITE_OTHER): Payer: 59 | Admitting: Family Medicine

## 2019-06-04 ENCOUNTER — Encounter: Payer: Self-pay | Admitting: Family Medicine

## 2019-06-04 ENCOUNTER — Other Ambulatory Visit: Payer: Self-pay

## 2019-06-04 DIAGNOSIS — R197 Diarrhea, unspecified: Secondary | ICD-10-CM

## 2019-06-04 DIAGNOSIS — B349 Viral infection, unspecified: Secondary | ICD-10-CM | POA: Diagnosis not present

## 2019-06-04 NOTE — Progress Notes (Signed)
Virtual Visit via Video   I connected with patient on 06/04/19 at  9:00 AM EDT by a video enabled telemedicine application and verified that I am speaking with the correct person using two identifiers.  Location patient: Home Location provider: Acup[facility]ist, Office Persons participating in the virtual visit: Patient, Provider, [place] (Jess B)  I discussed the limitations of evaluation and management by telemedicine and the availability of in person appointments. The patient expressed understanding and agreed to proceed.  Subjective:   HPI:   Diarrhea- sxs started Saturday afternoon w/ nausea.  Developed 'really bad diarrhea' on Sunday.  Pt was unable to control BMs.  Has limited diet to water and crackers.  No fever but + chills.  No vomiting, no pain.  No diarrhea since Monday.  Stools were watery, no blood.  Pt reports she is fearful of eating.  + cough, unable to taste or smell (but that has been for months).  No known sick contacts.    ROS:   See pertinent positives and negatives per HPI.  Patient Active Problem List   Diagnosis Date Noted  . Obesity (BMI 30-39.9) 03/10/2019  . Primary osteoarthritis of both knees 03/27/2018  . Primary osteoarthritis of both hands 03/27/2018  . DDD (degenerative disc disease), lumbar 03/27/2018  . DDD (degenerative disc disease), cervical 03/27/2018  . Vitamin D deficiency 01/24/2018  . Chest pain 01/25/2016  . Lumbar radiculopathy 08/06/2015  . Memory loss 12/02/2014  . History of cervical cancer 12/02/2014  . History of MI (myocardial infarction) 12/02/2014  . Insomnia 08/31/2014  . GERD (gastroesophageal reflux disease) 07/08/2013  . Abdominal pain, epigastric 07/08/2013  . Screening for malignant neoplasm of cervix 07/19/2012  . Routine general medical examination at a health care facility 07/19/2012  . Depression with anxiety 06/06/2012  . Hyperlipidemia 09/06/2011  . CAD (coronary artery disease) 06/24/2001    Social  History   Tobacco Use  . Smoking status: Former Smoker    Types: Cigarettes  . Smokeless tobacco: Never Used  . Tobacco comment: quit over 30 years ago   Substance Use Topics  . Alcohol use: Yes    Alcohol/week: 0.0 standard drinks    Comment: daily    Current Outpatient Medications:  .  Alirocumab (PRALUENT) 75 MG/ML SOAJ, Inject 1 pen into the skin every 14 (fourteen) days., Disp: 2 pen, Rfl: 11 .  ALPRAZolam (XANAX) 0.5 MG tablet, TAKE 1 TABLET BY MOUTH 2 TIMES DAILY AS NEEDED, Disp: 30 tablet, Rfl: 3 .  aspirin 81 MG tablet, Take 81 mg by mouth daily., Disp: , Rfl:  .  Aspirin-Salicylamide-Caffeine (BC HEADACHE POWDER PO), Take by mouth as needed., Disp: , Rfl:  .  buPROPion (WELLBUTRIN XL) 150 MG 24 hr tablet, Take 1 tablet (150 mg total) by mouth daily., Disp: 30 tablet, Rfl: 3 .  busPIRone (BUSPAR) 7.5 MG tablet, TAKE 1 TABLET BY MOUTH 2 TIMES DAILY, Disp: 60 tablet, Rfl: 3 .  Cholecalciferol (VITAMIN D3) 25 MCG (1000 UT) CAPS, Take by mouth daily., Disp: , Rfl:  .  cyclobenzaprine (FLEXERIL) 10 MG tablet, TAKE 1 TABLET BY MOUTH 3 TIMES DAILY AS NEEDED FOR MUSCLE SPASMS, Disp: 30 tablet, Rfl: 0 .  fexofenadine (ALLEGRA) 180 MG tablet, Take 180 mg by mouth daily. Reported on 06/02/2015, Disp: , Rfl:  .  FLUoxetine (PROZAC) 40 MG capsule, TAKE 1 CAPSULE BY MOUTH EVERY DAY, Disp: 90 capsule, Rfl: 0 .  ibuprofen (ADVIL,MOTRIN) 200 MG tablet, Take 800 mg by mouth every  6 (six) hours as needed. For pain, Disp: , Rfl:  .  nitroGLYCERIN (NITROSTAT) 0.4 MG SL tablet, Place 1 tablet (0.4 mg total) under the tongue every 5 (five) minutes as needed., Disp: 25 tablet, Rfl: 3 .  pantoprazole (PROTONIX) 40 MG tablet, TAKE 1 TABLET BY MOUTH EVERY DAY, Disp: 90 tablet, Rfl: 0 .  pregabalin (LYRICA) 75 MG capsule, 1 PO BID, may increase to 2 PO BID if needed., Disp: 120 capsule, Rfl: 3 .  traMADol (ULTRAM) 50 MG tablet, TAKE 1 TABLET BY MOUTH EVERY 8 HOURS AS NEEDED, Disp: 30 tablet, Rfl: 0 .   zolpidem (AMBIEN) 10 MG tablet, TAKE 1 TABLET BY MOUTH NIGHTLY AT BEDTIME AS NEEDED for SLEEP, Disp: 30 tablet, Rfl: 3  Allergies  Allergen Reactions  . Cefdinir Swelling    Gums, tongue -- mild  . Shellfish Allergy   . Statins     Failed Crestor 5 mg qd, Simvastatin 40 mg, and Lipitor 40 mg, and pravastatin 10 mg qd due to muscle and joint pain  . Zetia [Ezetimibe]     Depression and constipation per patient  . Mobic [Meloxicam] Rash    Objective:   LMP 07/05/2012  AAOx3, NAD NCAT, EOMI No obvious CN deficits Coloring WNL Pt is able to speak clearly, coherently without shortness of breath or increased work of breathing.  Thought process is linear.  Mood is appropriate.   Assessment and Plan:   Viral illness/diarrhea- new.  Pt's sxs are consistent w/ viral infxn but since she is having chills, cough, and other potential COVID sxs I encouraged her to go home from work, get a rapid test, and tx symptoms.  Fluids, rest, immodium prn.  Pt expressed understanding and is in agreement w/ plan.    Annye Asa, MD 06/04/2019

## 2019-06-04 NOTE — Progress Notes (Signed)
I have discussed the procedure for the virtual visit with the patient who has given consent to proceed with assessment and treatment.   Pt unable to obtain vitals.   Leoma Folds L Wilson Dusenbery, CMA     

## 2019-06-06 ENCOUNTER — Encounter: Payer: Self-pay | Admitting: Family Medicine

## 2019-06-06 NOTE — Telephone Encounter (Signed)
Hold for [place]

## 2019-06-07 ENCOUNTER — Encounter: Payer: Self-pay | Admitting: Family Medicine

## 2019-06-07 DIAGNOSIS — M25562 Pain in left knee: Secondary | ICD-10-CM

## 2019-06-08 ENCOUNTER — Encounter: Payer: Self-pay | Admitting: Family Medicine

## 2019-06-09 ENCOUNTER — Encounter: Payer: Self-pay | Admitting: Family Medicine

## 2019-06-09 ENCOUNTER — Telehealth: Payer: Self-pay | Admitting: General Practice

## 2019-06-09 NOTE — Telephone Encounter (Signed)
I agree that pt needs to be triaged b/c her sxs of word finding difficulty, shaking, and inability to do tasks (which I would need more info on) are concerning for possible neurologic event like a TIA.  If she continues to have these sxs, she needs to go to ER for evaluation.  If sxs have resolved, she is an established pt at [facility] and needs to call and let them know what she is experiencing to get an appt before July.  Also, with the autoimmune/Lupus issues, she has seen Dr Estanislado Pandy and would need a f/u appt

## 2019-06-09 NOTE — Telephone Encounter (Signed)
Encounter closed. Pt contacted PCP in mychart.

## 2019-06-09 NOTE — Telephone Encounter (Signed)
Per [employer]. Pt refused to talk to triage nurse. Would only speak with me. The triage line had hung-up before pt could be transferred. Tried calling pt back and no answer.

## 2019-06-09 NOTE — Telephone Encounter (Signed)
Made into a phone note and called to attempt to triage pt. No answer. LMOVM to return call and to advise that per PCP if still having symptoms she needs to proceed to ED.

## 2019-06-09 NOTE — Telephone Encounter (Signed)
Pt called back. Sent pt to get triaged by team health.

## 2019-06-09 NOTE — Telephone Encounter (Signed)
Received a mychart message from pt (copied below)  Called and LMOVM to triage. I did inform pt on VM that per PCP if she was having any current symptoms she should proceed to ER for evaluation.    Dr Birdie Riddle  This has been a bad week with the virus and today (Sunday) I've experienced shaking in my hands and inside me. I was unable to get words out clearly or forget what I was saying. I've been unable to do small tasks like hanging bird feeder. I can't see the Neurologist til July. I really feel like something is going on and it just comes on and lasts for several hours. I think we need to rethink this autoammiun system or Lupus. We decreased my wellbutrin but I don't think would do all this. I need some answers because something is not right.

## 2019-06-13 ENCOUNTER — Encounter: Payer: Self-pay | Admitting: Family Medicine

## 2019-06-13 ENCOUNTER — Other Ambulatory Visit: Payer: Self-pay | Admitting: Family Medicine

## 2019-06-13 ENCOUNTER — Ambulatory Visit: Payer: 59 | Admitting: Neurology

## 2019-06-13 NOTE — Telephone Encounter (Signed)
Last OV 05/14/19  Tramadol last filled 05/20/19 #30 with 0

## 2019-06-16 ENCOUNTER — Ambulatory Visit (INDEPENDENT_AMBULATORY_CARE_PROVIDER_SITE_OTHER): Payer: 59 | Admitting: Neurology

## 2019-06-16 ENCOUNTER — Encounter: Payer: Self-pay | Admitting: Family Medicine

## 2019-06-16 ENCOUNTER — Other Ambulatory Visit: Payer: Self-pay

## 2019-06-16 ENCOUNTER — Encounter: Payer: Self-pay | Admitting: Neurology

## 2019-06-16 VITALS — BP 129/82 | HR 114 | Ht 62.0 in | Wt 202.8 lb

## 2019-06-16 DIAGNOSIS — R404 Transient alteration of awareness: Secondary | ICD-10-CM

## 2019-06-16 DIAGNOSIS — R4789 Other speech disturbances: Secondary | ICD-10-CM | POA: Diagnosis not present

## 2019-06-16 NOTE — Patient Instructions (Signed)
1. Schedule MRI brain with and without contrast  2. Schedule 1-hour EEG, if normal, we will do a 48-hour EEG  3. Follow-up after tests, call for any changes

## 2019-06-16 NOTE — Progress Notes (Signed)
NEUROLOGY CONSULTATION NOTE  Maria Bishop MRN: [id number] DOB: [birth year]  Referring provider: Dr. Annye Asa Primary care provider: Dr. Annye Asa  Reason for consult:  Dizziness, vision changes  Dear Dr Birdie Riddle:  Thank you for your kind referral of Maria Bishop for consultation of the above symptoms. Although her history is well known to you, please allow me to reiterate it for the purpose of our medical record. She is alone in the office. Records and images were personally reviewed where available.   HISTORY OF PRESENT ILLNESS: This is a 59 year old right-handed woman with a history of hyperlipidemia, CAD, migraines, presenting for evaluation of dizziness and vision changes. She was seen in 11/2014 for memory loss and headaches. MRI brain in 12/2014 was normal. She was lost to follow-up and presents today with new symptoms that started over the past year. She would feel an internal shaking sensation, inability to get her words out. 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 She had noticed little things such as forgetting how the machine is supposed to work. These were occurring a couple times a month, but recently have been occurring almost daily. She got scared after an episode last 06/08/19 while she was outside feeling fine, hanging up bird feeders, when suddenly it felt like both hands were shaking inside and she could not do hand-eye coordination. Her husband asked her what she was doing, and when she started talking her speech was "jumpy," forgetting her words. She became agitated and took a Xanax, she sat down and started feeling better over the next hour. She became scared because even her family noticed something was different. She denies any clear triggers. She had one at work today, she started getting fidgety trying to do many things at one time, her  hands felt shaky and tingly. She has seen Ortho due to the numbness in her arms and legs, she recalls one time 2 years ago she could not feel anything below her legs while sitting on her horse, she could not lift herself or lift her feet. This subsided in 10-15 minutes, she felt tired and sick the next day. EMG in 03/2019 showed right medial neuropathy (moderate and left median neuropathy (very mild). No cervical radiculopathy noted. She has been on Lyrica which has helped with the tingling, it does not go all the way down anymore in her legs, but her hands still tingle. She has been told by people that she is "not there" sometimes and would not respond. She has not noticed any change in symptoms with lowering of Wellbutrin dose, her mood is "depressed" for the past several years. She saw a therapist in the past. She denies any suicidal ideation, "nothing I would act upon." No family history of similar symptoms. Her paternal half-sister had a brain tumor. She denies any history of significant head injuries.   She continues to report brain fog "all the time" for a long time now, but has noticed it more recently. She lives with her significant other Elberta Fortis and denies any missed bills or medications. She denies getting lost driving. She gets 7-8 hours of sleep and feels tired sometimes upon awakening. She has headaches on a regular basis and is not sure if this is related to her other symptoms. She gets dizziness sometimes, that may occur with the episodes. A couple of weeks ago, she told  the optometrist she works with that she was looking at something and looked like it was raised up. This has happened a couple of times. Vision is blurred. She has noticed swelling in her left knee, she had an injection last week. Her balance is off, she has been running into things.     PAST MEDICAL HISTORY: Past Medical History:  Diagnosis Date  . CAD (coronary artery disease)    a. NSTEMI 2013: likely spontaneous  dissection/ruptured plaque in OM3 - PCI: 3 overlapping Promus DES to OM3, EF 55% b. cath 2014: patent stents  . Cervical cancer ([place])   . Degenerative disc disease   . Myocardial infarction ([place])    ~82yrs ago  . NSTEMI (non-ST elevated myocardial infarction) ([place]) 06/24/2011   a. NSTEMI 2013: likely spontaneous dissection/ruptured plaque in OM3 - PCI: 3 overlapping Promus DES to OM3, EF 55%     PAST SURGICAL HISTORY: Past Surgical History:  Procedure Laterality Date  . APPENDECTOMY    . [place] bunionectomy right  February 2015  . CARDIAC CATHETERIZATION N/A 01/26/2016   Procedure: Left Heart Cath and Coronary Angiography;  Surgeon: Belva Crome, MD;  Location: [facility];  Service: Cardiovascular;  Laterality: N/A;  . LEFT HEART CATHETERIZATION WITH CORONARY ANGIOGRAM N/A 06/24/2011   Procedure: LEFT HEART CATHETERIZATION WITH CORONARY ANGIOGRAM;  Surgeon: Jettie Booze, MD;  Location[facility] CATH [facility];  Service: Cardiovascular;  Laterality: N/A;  . PERCUTANEOUS CORONARY STENT INTERVENTION (PCI-S) Right 06/24/2011   Procedure: PERCUTANEOUS CORONARY STENT INTERVENTION (PCI-S);  Surgeon: Jettie Booze, MD;  Location: [facility] CATH LAB;  Service: Cardiovascular;  Laterality: Right;  . TUBAL LIGATION      MEDICATIONS: Current Outpatient Medications on File Prior to Visit  Medication Sig Dispense Refill  . Alirocumab (PRALUENT) 75 MG/ML SOAJ Inject 1 pen into the skin every 14 (fourteen) days. 2 pen 11  . ALPRAZolam (XANAX) 0.5 MG tablet TAKE 1 TABLET BY MOUTH 2 TIMES DAILY AS NEEDED 30 tablet 3  . aspirin 81 MG tablet Take 81 mg by mouth daily.    . Aspirin-Salicylamide-Caffeine (BC HEADACHE POWDER PO) Take by mouth as needed.    Marland Kitchen buPROPion (WELLBUTRIN XL) 150 MG 24 hr tablet Take 1 tablet (150 mg total) by mouth daily. 30 tablet 3  . busPIRone (BUSPAR) 7.5 MG tablet TAKE 1 TABLET BY MOUTH 2 TIMES DAILY 60 tablet 3  . Cholecalciferol (VITAMIN D3) 25 MCG (1000 UT) CAPS Take by  mouth daily.    . cyclobenzaprine (FLEXERIL) 10 MG tablet TAKE 1 TABLET BY MOUTH 3 TIMES DAILY AS NEEDED FOR MUSCLE SPASMS 30 tablet 0  . fexofenadine (ALLEGRA) 180 MG tablet Take 180 mg by mouth daily. Reported on 06/02/2015    . FLUoxetine (PROZAC) 40 MG capsule TAKE 1 CAPSULE BY MOUTH EVERY DAY 90 capsule 0  . ibuprofen (ADVIL,MOTRIN) 200 MG tablet Take 800 mg by mouth every 6 (six) hours as needed. For pain    . nitroGLYCERIN (NITROSTAT) 0.4 MG SL tablet Place 1 tablet (0.4 mg total) under the tongue every 5 (five) minutes as needed. 25 tablet 3  . pantoprazole (PROTONIX) 40 MG tablet TAKE 1 TABLET BY MOUTH EVERY DAY 90 tablet 0  . pregabalin (LYRICA) 75 MG capsule 1 PO BID, may increase to 2 PO BID if needed. 120 capsule 3  . traMADol (ULTRAM) 50 MG tablet TAKE 1 TABLET BY MOUTH EVERY 8 HOURS AS NEEDED 30 tablet 0  . zolpidem (AMBIEN) 10 MG tablet  TAKE 1 TABLET BY MOUTH NIGHTLY AT BEDTIME AS NEEDED for SLEEP 30 tablet 3   No current facility-administered medications on file prior to visit.    ALLERGIES: Allergies  Allergen Reactions  . Cefdinir Swelling    Gums, tongue -- mild  . Shellfish Allergy   . Statins     Failed Crestor 5 mg qd, Simvastatin 40 mg, and Lipitor 40 mg, and pravastatin 10 mg qd due to muscle and joint pain  . Zetia [Ezetimibe]     Depression and constipation per patient  . Mobic [Meloxicam] Rash    FAMILY HISTORY: Family History  Problem Relation Age of Onset  . Alzheimer's disease Maternal Uncle   . Coronary artery disease Mother        MI in her 23s  . Early death Father   . Brain cancer Sister   . Drug abuse Son   . Asthma Daughter   . Anxiety disorder Daughter     SOCIAL HISTORY: Social History   Socioeconomic History  . Marital status: Single    Spouse name: Not on file  . Number of children: 2  . Years of education: Not on file  . Highest education level: Not on file  Occupational History  . Occupation: Customer service manager  Tobacco  Use  . Smoking status: Former Smoker    Types: Cigarettes  . Smokeless tobacco: Never Used  . Tobacco comment: quit over 30 years ago   Substance and Sexual Activity  . Alcohol use: Yes    Alcohol/week: 0.0 standard drinks    Comment: daily  . Drug use: No  . Sexual activity: Yes    Birth control/protection: Condom  Other Topics Concern  . Not on file  Social History Narrative   Right handed    Social Determinants of Health   Financial Resource Strain:   . Difficulty of Paying Living Expenses:   Food Insecurity:   . Worried About Charity fundraiser in the Last Year:   . Arboriculturist in the Last Year:   Transportation Needs:   . Film/video editor (Medical):   Marland Kitchen Lack of Transportation (Non-Medical):   Physical Activity:   . Days of Exercise per Week:   . Minutes of Exercise per Session:   Stress:   . Feeling of Stress :   Social Connections:   . Frequency of Communication with Friends and Family:   . Frequency of Social Gatherings with Friends and Family:   . Attends Religious Services:   . Active Member of Clubs or Organizations:   . Attends Archivist Meetings:   Marland Kitchen Marital Status:   Intimate Partner Violence:   . Fear of Current or Ex-Partner:   . Emotionally Abused:   Marland Kitchen Physically Abused:   . Sexually Abused:     REVIEW OF SYSTEMS: Constitutional: No fevers, chills, or sweats, no generalized fatigue, change in appetite Eyes: No visual changes, double vision, eye pain Ear, nose and throat: No hearing loss, ear pain, nasal congestion, sore throat Cardiovascular: No chest pain, palpitations Respiratory:  No shortness of breath at rest or with exertion, wheezes GastrointestinaI: No nausea, vomiting, diarrhea, abdominal pain, fecal incontinence Genitourinary:  No dysuria, urinary retention or frequency Musculoskeletal:  No neck pain, back pain Integumentary: No rash, pruritus, skin lesions Neurological: as above Psychiatric: + depression,  anxiety Endocrine: No palpitations, fatigue, diaphoresis, mood swings, change in appetite, change in weight, increased thirst Hematologic/Lymphatic:  No anemia, purpura, petechiae. Allergic/Immunologic: no itchy/runny  eyes, nasal congestion, recent allergic reactions, rashes  PHYSICAL EXAM: Vitals:   06/16/19 1258  BP: 129/82  Pulse: (!) 114  SpO2: 97%   General: No acute distress, depressed mood Head:  Normocephalic/atraumatic Skin/Extremities: No rash, no edema Neurological Exam: Mental status: alert and oriented to person, place, and time, no dysarthria or aphasia, Fund of knowledge is appropriate.  Recent and remote memory are intact.  Attention and concentration are normal.    Cranial nerves: CN I: not tested CN II: pupils equal, round and reactive to light, visual fields intact CN III, IV, VI:  full range of motion, no nystagmus, no ptosis CN V: facial sensation intact CN VII: upper and lower face symmetric CN VIII: hearing intact to conversation Bulk & Tone: normal, no fasciculations. Motor: 5/5 throughout with no pronator drift. Sensation: patchy sensory changes on the left UE, left LE, intact pin, vibration sense Romberg test negative Deep Tendon Reflexes: +2 throughout, no ankle clonus Cerebellar: no incoordination on finger to nose testing Gait: slow and cautious reporting left knee pain Tremor: none   IMPRESSION: This is a 59 year old right-handed woman with a history of hyperlipidemia, CAD, anxiety, depression, migraines, presenting for evaluation of recurrent episodes of internal shaking sensation, word-finding difficulties over the past year, that have increased in frequency recently. Her neurological exam is non-focal, there is subjective sensory changes. Etiology of symptoms is unclear. We discussed low likelihood of TIA/stroke, considerations include seizure, anxiety. MRI brain with and without contrast will be ordered to assess for underlying structural  abnormality. A 1-hour EEG will be ordered, if normal, we will do a 48-hour EEG to further classify these episodes. She will follow-up after tests and knows to call for any changes.   Thank you for allowing me to participate in the care of this patient. Please do not hesitate to call for any questions or concerns.   Ellouise Newer, M.D.  CC: Dr. Birdie Riddle

## 2019-06-16 NOTE — Progress Notes (Signed)
Auth# [id number] valid until 06/24

## 2019-06-19 ENCOUNTER — Telehealth: Payer: Self-pay | Admitting: Cardiovascular Disease

## 2019-06-19 NOTE — Telephone Encounter (Signed)
Patient is requesting documentation regarding stent. She states she is having an MRI and she would like to have information regarding her stent prior to MRI. She states a detailed voice message may be left if she is not available and information may also be sent to [place].

## 2019-06-19 NOTE — Telephone Encounter (Signed)
Cath report from 2013 printed and mailed to confirmed address. The patient was grateful for assistance.

## 2019-06-20 ENCOUNTER — Encounter: Payer: Self-pay | Admitting: Family Medicine

## 2019-06-20 MED ORDER — LIDOCAINE VISCOUS HCL 2 % MT SOLN: 5.0000 mL | Freq: Four times a day (QID) | OROMUCOSAL | 0 refills | Status: AC | PRN

## 2019-06-24 ENCOUNTER — Encounter: Payer: Self-pay | Admitting: Family Medicine

## 2019-06-27 ENCOUNTER — Encounter: Payer: Self-pay | Admitting: Family Medicine

## 2019-06-30 ENCOUNTER — Ambulatory Visit (INDEPENDENT_AMBULATORY_CARE_PROVIDER_SITE_OTHER): Payer: 59 | Admitting: Neurology

## 2019-06-30 ENCOUNTER — Other Ambulatory Visit: Payer: Self-pay

## 2019-06-30 DIAGNOSIS — R404 Transient alteration of awareness: Secondary | ICD-10-CM

## 2019-06-30 DIAGNOSIS — R4789 Other speech disturbances: Secondary | ICD-10-CM

## 2019-06-30 NOTE — Procedures (Signed)
ELECTROENCEPHALOGRAM REPORT  Date of Study: 06/30/2019  Patient's Name: Maria Bishop MRN: [id number] Date of Birth: 1960/09/28  Referring Provider: Dr. Ellouise Newer  Clinical History: This is a 59 year old woman with episodes of internal shaking sensation, word-finding difficulties. EEG for classification.  Medications: PRALUENT 75 MG/ML SOAJ XANAX 0.5 MG tablet aspirin 81 MG tablet BC HEADACHE POWDER PO WELLBUTRIN XL 150 MG 24 hr tablet BUSPAR 7.5 MG tablet VITAMIN D3 25 MCG (1000 UT) CAPS FLEXERIL 10 MG tablet ALLEGRA 180 MG tablet PROZAC 40 MG capsule ADVIL,MOTRIN 200 MG tablet NITROSTAT 0.4 MG SL tablet PROTONIX 40 MG tablet LYRICA 75 MG capsule ULTRAM 50 MG tablet AMBIEN 10 MG tablet  Technical Summary: A multichannel digital 1-hour EEG recording measured by the international 10-20 system with electrodes applied with paste and impedances below 5000 ohms performed in our laboratory with EKG monitoring in an awake and asleep patient.  Hyperventilation was not performed. Photic stimulation was performed.  The digital EEG was referentially recorded, reformatted, and digitally filtered in a variety of bipolar and referential montages for optimal display.    Description: The patient is awake and asleep during the recording.  During maximal wakefulness, there is a symmetric, medium voltage 10 Hz posterior dominant rhythm that attenuates with eye opening.  The record is symmetric.  During drowsiness and sleep, there is an increase in theta slowing of the background.  Vertex waves and symmetric sleep spindles were seen.  Photic stimulation showed excellent driving response and did not elicit any abnormalities.  There were no epileptiform discharges or electrographic seizures seen.    EKG lead was unremarkable.  Impression: This 1-hour awake and asleep EEG is normal.    Clinical Correlation: A normal EEG does not exclude a clinical diagnosis of epilepsy.  If further clinical  questions remain, prolonged EEG may be helpful.  Clinical correlation is advised.   Ellouise Newer, M.D.

## 2019-07-01 ENCOUNTER — Other Ambulatory Visit: Payer: Self-pay | Admitting: Family Medicine

## 2019-07-01 ENCOUNTER — Telehealth: Payer: Self-pay | Admitting: Neurology

## 2019-07-01 ENCOUNTER — Encounter: Payer: Self-pay | Admitting: Family Medicine

## 2019-07-01 ENCOUNTER — Telehealth: Payer: Self-pay

## 2019-07-01 MED ORDER — TRAMADOL HCL 50 MG PO TABS: 50.0000 mg | ORAL_TABLET | Freq: Three times a day (TID) | ORAL | 0 refills | Status: DC | PRN

## 2019-07-01 NOTE — Telephone Encounter (Signed)
Patient called in asking for [facility]. She stated a message can be left on her machine. She is at work and cannot always answer her phone.

## 2019-07-01 NOTE — Telephone Encounter (Signed)
Pt called no answer voice mail left for pt to call back 

## 2019-07-01 NOTE — Telephone Encounter (Signed)
-----   Message from Cameron Sprang, MD sent at 07/01/2019 11:35 AM EDT ----- Pls let her know the EEG was normal, proceed with 48-hour EEG and MRI brain as planned, thanks

## 2019-07-01 NOTE — Telephone Encounter (Signed)
Pt called and informed that EEG was normal, proceed with 48-hour EEG and MRI brain as planned

## 2019-07-02 ENCOUNTER — Other Ambulatory Visit: Payer: Self-pay | Admitting: Family Medicine

## 2019-07-02 ENCOUNTER — Telehealth: Payer: Self-pay | Admitting: *Deleted

## 2019-07-02 MED ORDER — NABUMETONE 750 MG PO TABS: 750.0000 mg | ORAL_TABLET | Freq: Two times a day (BID) | ORAL | 6 refills | Status: DC | PRN

## 2019-07-02 NOTE — Telephone Encounter (Signed)
Cyclobenzaprine last filled 06/13/19 #30 with no refills. Last office visit 06/04/19 No future visit scheduled

## 2019-07-02 NOTE — Telephone Encounter (Signed)
LMOM to call me back that I received her message stating she is unable to keep the June 7th appointment. I stated I will remove her from the schedule for that date and to call me Monday Wednesday or Thursday morning so we can find a date that works for her.

## 2019-07-02 NOTE — Addendum Note (Signed)
Addended by: Hortencia Pilar on: 07/02/2019 08:39 AM   Modules accepted: Orders

## 2019-07-02 NOTE — Telephone Encounter (Signed)
Please advise, per chart this was filled in April for 150mg .

## 2019-07-08 ENCOUNTER — Other Ambulatory Visit: Payer: Self-pay | Admitting: Family Medicine

## 2019-07-08 ENCOUNTER — Encounter: Payer: Self-pay | Admitting: Family Medicine

## 2019-07-08 DIAGNOSIS — F5101 Primary insomnia: Secondary | ICD-10-CM

## 2019-07-08 NOTE — Telephone Encounter (Signed)
Last OV 05/14/19 Zolpidem last filled 04/08/19 #30 with 3

## 2019-07-10 NOTE — Telephone Encounter (Signed)
LMOM we need to set up the appointment for the 48 hour ambulatory EEG. I am here part time, so please call all day Monday and Wednesday and 1/2 day Thursday so we can set up the appointment.

## 2019-07-14 ENCOUNTER — Other Ambulatory Visit: Payer: Self-pay | Admitting: Family Medicine

## 2019-07-14 ENCOUNTER — Other Ambulatory Visit: Payer: 59

## 2019-07-18 ENCOUNTER — Telehealth: Payer: Self-pay | Admitting: Family Medicine

## 2019-07-18 ENCOUNTER — Encounter: Payer: Self-pay | Admitting: Family Medicine

## 2019-07-18 NOTE — Telephone Encounter (Signed)
Patient called.   Her MRI was canceled because the authorization was not received in time. She is requesting a call back from her providers to see what she can do in the meantime as she is in a severe amount of pain   Call back: [phone]

## 2019-07-19 ENCOUNTER — Ambulatory Visit
Admission: RE | Admit: 2019-07-19 | Discharge: 2019-07-19 | Disposition: A | Discharge status: Home / Self Care | Payer: 59 | Source: Ambulatory Visit | Attending: Neurology | Admitting: Neurology

## 2019-07-19 ENCOUNTER — Other Ambulatory Visit: Payer: Self-pay

## 2019-07-19 ENCOUNTER — Other Ambulatory Visit: Payer: 59

## 2019-07-19 DIAGNOSIS — R404 Transient alteration of awareness: Secondary | ICD-10-CM

## 2019-07-19 DIAGNOSIS — R4789 Other speech disturbances: Secondary | ICD-10-CM

## 2019-07-19 MED ORDER — GADOBENATE DIMEGLUMINE 529 MG/ML IV SOLN
19.0000 mL | Freq: Once | INTRAVENOUS | Status: AC | PRN
  Administered 2019-07-19: 19 mL via INTRAVENOUS

## 2019-07-21 ENCOUNTER — Other Ambulatory Visit: Payer: Self-pay

## 2019-07-21 ENCOUNTER — Encounter: Payer: Self-pay | Admitting: Family Medicine

## 2019-07-21 ENCOUNTER — Telehealth: Payer: Self-pay | Admitting: *Deleted

## 2019-07-21 DIAGNOSIS — R404 Transient alteration of awareness: Secondary | ICD-10-CM

## 2019-07-21 NOTE — Telephone Encounter (Signed)
LMOM we need to set up the appointment for the 48 hour ambulatory EEG. I am here part time, so please call all day Monday and Wednesday and 1/2 day Thursday so we can set up the appointment. Offered June 21 at 7:30 or 9:30 with removal Wednesday June 23rd at 8:30 or 10:30 respectively

## 2019-07-21 NOTE — Telephone Encounter (Signed)
Please just schedule with Xu to discuss possible surgery without getting MRI first.

## 2019-07-21 NOTE — Telephone Encounter (Signed)
S/w patient. Appt scheduled to see Dr Erlinda Hong

## 2019-07-21 NOTE — Telephone Encounter (Signed)
Please advise. Thanks.  

## 2019-07-22 NOTE — Telephone Encounter (Signed)
Pt Appt scheduled to see Dr Erlinda Hong without getting MRI for possible surgery

## 2019-07-24 ENCOUNTER — Other Ambulatory Visit: Payer: Self-pay | Admitting: Family Medicine

## 2019-07-24 NOTE — Telephone Encounter (Signed)
Last refill: 04/14/19 #30, 3 Last OV: 05/14/19 dx. Dizziness

## 2019-07-28 ENCOUNTER — Encounter: Payer: Self-pay | Admitting: Family Medicine

## 2019-07-28 ENCOUNTER — Encounter: Payer: Self-pay | Admitting: Physician Assistant

## 2019-07-28 ENCOUNTER — Telehealth (INDEPENDENT_AMBULATORY_CARE_PROVIDER_SITE_OTHER): Payer: 59 | Admitting: Physician Assistant

## 2019-07-28 DIAGNOSIS — J019 Acute sinusitis, unspecified: Secondary | ICD-10-CM | POA: Diagnosis not present

## 2019-07-28 DIAGNOSIS — B9689 Other specified bacterial agents as the cause of diseases classified elsewhere: Secondary | ICD-10-CM | POA: Diagnosis not present

## 2019-07-28 DIAGNOSIS — J069 Acute upper respiratory infection, unspecified: Secondary | ICD-10-CM | POA: Diagnosis not present

## 2019-07-28 MED ORDER — BENZONATATE 100 MG PO CAPS: 100.0000 mg | ORAL_CAPSULE | Freq: Three times a day (TID) | ORAL | 0 refills | Status: DC | PRN

## 2019-07-28 MED ORDER — DOXYCYCLINE HYCLATE 100 MG PO TABS: 100.0000 mg | ORAL_TABLET | Freq: Two times a day (BID) | ORAL | 0 refills | Status: DC

## 2019-07-28 NOTE — Progress Notes (Signed)
Virtual Visit via Video   I connected with patient on 07/28/19 at  4:00 PM EDT by a video enabled telemedicine application and verified that I am speaking with the correct person using two identifiers.  Location patient: Home Location provider: [facility], Office Persons participating in the virtual visit: Patient, Provider, [place] (Patina Moore)  I discussed the limitations of evaluation and management by telemedicine and the availability of in person appointments. The patient expressed understanding and agreed to proceed.  Subjective:   HPI:   Patient presents via [place] today complaining of URI symptoms.  Notes fatigue and a constant dull headache starting Thursday evening continuing into Friday with worsening fatigue.  Noted some nasal congestion and sinus pressure.  Since yesterday morning has had some chills sweats, chest congestion with cough that is mainly dry.  Now sinus pain and tenderness.  Denies fever.  Denies shortness of breath or chest tightness.  Denies loss of taste or smell.  Denies recent travel.  Has been around her son who had similar symptoms but notes he is feeling better now.  Has had both Covid vaccines.  Denies any known exposure to Covid.  Patient endorses she has taken Robitussin, naproxen and Tylenol counter for symptoms.  Continues her daily Allegra.   ROS:   See pertinent positives and negatives per HPI.  Patient Active Problem List   Diagnosis Date Noted  . Obesity (BMI 30-39.9) 03/10/2019  . Primary osteoarthritis of both knees 03/27/2018  . Primary osteoarthritis of both hands 03/27/2018  . DDD (degenerative disc disease), lumbar 03/27/2018  . DDD (degenerative disc disease), cervical 03/27/2018  . Vitamin D deficiency 01/24/2018  . Chest pain 01/25/2016  . Lumbar radiculopathy 08/06/2015  . Memory loss 12/02/2014  . History of cervical cancer 12/02/2014  . History of MI (myocardial infarction) 12/02/2014  . Insomnia 08/31/2014  .  GERD (gastroesophageal reflux disease) 07/08/2013  . Abdominal pain, epigastric 07/08/2013  . Screening for malignant neoplasm of cervix 07/19/2012  . Routine general medical examination at a health care facility 07/19/2012  . Depression with anxiety 06/06/2012  . Hyperlipidemia 09/06/2011  . CAD (coronary artery disease) 06/24/2001    Social History   Tobacco Use  . Smoking status: Former Smoker    Types: Cigarettes  . Smokeless tobacco: Never Used  . Tobacco comment: quit over 30 years ago   Substance Use Topics  . Alcohol use: Yes    Alcohol/week: 0.0 standard drinks    Comment: daily    Current Outpatient Medications:  .  ALPRAZolam (XANAX) 0.5 MG tablet, TAKE 1 TABLET BY MOUTH 2 TIMES DAILY AS NEEDED, Disp: 30 tablet, Rfl: 3 .  aspirin 81 MG tablet, Take 81 mg by mouth daily., Disp: , Rfl:  .  Aspirin-Salicylamide-Caffeine (BC HEADACHE POWDER PO), Take by mouth as needed., Disp: , Rfl:  .  busPIRone (BUSPAR) 7.5 MG tablet, TAKE 1 TABLET BY MOUTH 2 TIMES DAILY (Patient taking differently: daily. ), Disp: 60 tablet, Rfl: 3 .  Cholecalciferol (VITAMIN D3) 25 MCG (1000 UT) CAPS, Take by mouth daily., Disp: , Rfl:  .  Cobalamin Combinations (B-12) [phone] MCG SUBL, , Disp: , Rfl:  .  cyclobenzaprine (FLEXERIL) 10 MG tablet, TAKE 1 TABLET BY MOUTH 3 TIMES DAILY AS NEEDED FOR MUSCLE SPASMS, Disp: 30 tablet, Rfl: 0 .  fexofenadine (ALLEGRA) 180 MG tablet, Take 180 mg by mouth daily. Reported on 06/02/2015, Disp: , Rfl:  .  FLUoxetine (PROZAC) 40 MG capsule, TAKE 1 CAPSULE BY MOUTH  EVERY DAY, Disp: 90 capsule, Rfl: 0 .  ibuprofen (ADVIL,MOTRIN) 200 MG tablet, Take 800 mg by mouth every 6 (six) hours as needed. For pain, Disp: , Rfl:  .  lidocaine (XYLOCAINE) 2 % solution, Use as directed 5 mLs in the mouth or throat every 6 (six) hours as needed for mouth pain., Disp: 100 mL, Rfl: 0 .  nabumetone (RELAFEN) 750 MG tablet, Take 1 tablet (750 mg total) by mouth 2 (two) times daily as  needed., Disp: 60 tablet, Rfl: 6 .  nitroGLYCERIN (NITROSTAT) 0.4 MG SL tablet, Place 1 tablet (0.4 mg total) under the tongue every 5 (five) minutes as needed., Disp: 25 tablet, Rfl: 3 .  pantoprazole (PROTONIX) 40 MG tablet, TAKE 1 TABLET BY MOUTH EVERY DAY, Disp: 90 tablet, Rfl: 0 .  pregabalin (LYRICA) 75 MG capsule, 1 PO BID, may increase to 2 PO BID if needed., Disp: 120 capsule, Rfl: 3 .  traMADol (ULTRAM) 50 MG tablet, TAKE 1 TABLET BY MOUTH EVERY 8 HOURS AS NEEDED, Disp: 30 tablet, Rfl: 0 .  zolpidem (AMBIEN) 10 MG tablet, TAKE 1 TABLET BY MOUTH NIGHTLY AT BEDTIME AS NEEDED FOR SLEEP, Disp: 30 tablet, Rfl: 3 .  buPROPion (WELLBUTRIN XL) 150 MG 24 hr tablet, Take 1 tablet (150 mg total) by mouth daily. (Patient not taking: Reported on 07/28/2019), Disp: 30 tablet, Rfl: 3  Allergies  Allergen Reactions  . Cefdinir Swelling    Gums, tongue -- mild  . Shellfish Allergy   . Statins     Failed Crestor 5 mg qd, Simvastatin 40 mg, and Lipitor 40 mg, and pravastatin 10 mg qd due to muscle and joint pain  . Zetia [Ezetimibe]     Depression and constipation per patient  . Mobic [Meloxicam] Rash    Objective:   LMP 07/05/2012   Patient is well-developed, well-nourished in no acute distress.  Resting comfortably at home.  Head is normocephalic, atraumatic.  No labored breathing.  Speech is clear and coherent with logical content.  Patient is alert and oriented at baseline.  + TTP sinuses.  Assessment and Plan:   1. Viral URI with cough No symptoms and progression indicative of a viral illness.  Has been vaccinated against Covid.  Discussed she should still be tested.  She will give some thought to this.  We will have her increase fluids, rest.  Start saline nasal rinse.  Start plain Mucinex OTC.  Rx Tessalon for cough.  Strict urgent care/ER precautions reviewed with patient.  2. Acute bacterial sinusitis Giving now having sinus pain and tooth pain over the past 48 hours there is  some concern for developing a secondary bacterial sinusitis.  Will have her follow the regimen above as noted.  Rx doxycycline 100 mg twice daily x10 days.    Leeanne Rio, [place] 07/28/2019

## 2019-07-28 NOTE — Patient Instructions (Signed)
Instructions sent to MyChart

## 2019-07-29 ENCOUNTER — Ambulatory Visit: Payer: 59 | Admitting: Orthopaedic Surgery

## 2019-07-30 ENCOUNTER — Ambulatory Visit: Payer: 59 | Admitting: Orthopaedic Surgery

## 2019-07-30 ENCOUNTER — Telehealth: Payer: Self-pay

## 2019-07-30 ENCOUNTER — Encounter: Payer: Self-pay | Admitting: Orthopaedic Surgery

## 2019-07-30 VITALS — Ht 62.0 in | Wt 203.0 lb

## 2019-07-30 DIAGNOSIS — M1712 Unilateral primary osteoarthritis, left knee: Secondary | ICD-10-CM | POA: Diagnosis not present

## 2019-07-30 MED ORDER — BUPIVACAINE HCL 0.5 % IJ SOLN
2.0000 mL | INTRAMUSCULAR | Status: AC | PRN
  Administered 2019-07-30: 2 mL via INTRA_ARTICULAR

## 2019-07-30 MED ORDER — METHYLPREDNISOLONE ACETATE 40 MG/ML IJ SUSP
40.0000 mg | INTRAMUSCULAR | Status: AC | PRN
  Administered 2019-07-30: 40 mg via INTRA_ARTICULAR

## 2019-07-30 MED ORDER — LIDOCAINE HCL 1 % IJ SOLN
2.0000 mL | INTRAMUSCULAR | Status: AC | PRN
  Administered 2019-07-30: 2 mL

## 2019-07-30 NOTE — Progress Notes (Signed)
Office Visit Note   Patient: Maria Bishop           Date of Birth: 01-28-1961           MRN: [id number] Visit Date: 07/30/2019              Requested by: Maria Minium, MD [address] [place],  [place] [address] PCP: Maria Minium, MD   Assessment & Plan: Visit Diagnoses:  1. Primary osteoarthritis of left knee     Plan: Impression is tricompartmental left knee DJD worst in the patellofemoral compartment.  These findings were reviewed with the patient based on the x-rays that she had last year.  I do not get the sense that she has any meniscal pathology.  Do not feel strongly that she needs an MRI at this point.  We discussed repeat cortisone injection accompanied with an aspiration for the effusion.  We also talked about Visco injection, weight loss, physical therapy, topical and oral NSAIDs, referral to medical weight management.  Based on discussion patient elected to undergo aspiration injection with cortisone today.  We will see her back as needed.  22 cc of fluid aspirated today.  Follow-Up Instructions: Return if symptoms worsen or fail to improve.   Orders:  No orders of the defined types were placed in this encounter.  No orders of the defined types were placed in this encounter.     Procedures: Large Joint Inj: L knee on 07/30/2019 4:33 PM Details: 22 G needle Medications: 2 mL bupivacaine 0.5 %; 2 mL lidocaine 1 %; 40 mg methylPREDNISolone acetate 40 MG/ML Outcome: tolerated well, no immediate complications Patient was prepped and draped in the usual sterile fashion.       Clinical Data: No additional findings.   Subjective: Chief Complaint  Patient presents with  . Left Knee - Pain    Patient is a 59 year old female comes in for chronic left knee pain.  She has been seeing Dr. Junius Bishop in the past for this issue.  Unfortunately her MRI recently was denied by her insurance.  She has chronic patellofemoral pain mainly anterior without any  mechanical symptoms.  She does endorse swelling is worse with activity and standing.  Any previous injuries or surgeries.   Review of Systems  Constitutional: Negative.   HENT: Negative.   Eyes: Negative.   Respiratory: Negative.   Cardiovascular: Negative.   Endocrine: Negative.   Musculoskeletal: Negative.   Neurological: Negative.   Hematological: Negative.   Psychiatric/Behavioral: Negative.   All other systems reviewed and are negative.    Objective: Vital Signs: Ht 5\' 2"  (1.575 m)   Wt 203 lb (92.1 kg)   LMP 07/05/2012   BMI 37.13 kg/m   Physical Exam Vitals and nursing note reviewed.  Constitutional:      Appearance: She is well-developed.  Pulmonary:     Effort: Pulmonary effort is normal.  Skin:    General: Skin is warm.     Capillary Refill: Capillary refill takes less than 2 seconds.  Neurological:     Mental Status: She is alert and oriented to person, place, and time.  Psychiatric:        Behavior: Behavior normal.        Thought Content: Thought content normal.        Judgment: Judgment normal.     Ortho Exam Left knee shows a small joint effusion.  2+ patellofemoral crepitus.  Collaterals and cruciates are stable.  No joint  line tenderness.  Pain with patellar compression Specialty C mild omments:  No specialty comments available.  Imaging: No results found.   PMFS History: Patient Active Problem List   Diagnosis Date Noted  . Primary osteoarthritis of left knee 07/30/2019  . Obesity (BMI 30-39.9) 03/10/2019  . Primary osteoarthritis of both knees 03/27/2018  . Primary osteoarthritis of both hands 03/27/2018  . DDD (degenerative disc disease), lumbar 03/27/2018  . DDD (degenerative disc disease), cervical 03/27/2018  . Vitamin D deficiency 01/24/2018  . Chest pain 01/25/2016  . Lumbar radiculopathy 08/06/2015  . Memory loss 12/02/2014  . History of cervical cancer 12/02/2014  . History of MI (myocardial infarction) 12/02/2014  .  Insomnia 08/31/2014  . GERD (gastroesophageal reflux disease) 07/08/2013  . Abdominal pain, epigastric 07/08/2013  . Screening for malignant neoplasm of cervix 07/19/2012  . Routine general medical examination at a health care facility 07/19/2012  . Depression with anxiety 06/06/2012  . Hyperlipidemia 09/06/2011  . CAD (coronary artery disease) 06/24/2001   Past Medical History:  Diagnosis Date  . CAD (coronary artery disease)    a. NSTEMI 2013: likely spontaneous dissection/ruptured plaque in OM3 - PCI: 3 overlapping Promus DES to OM3, EF 55% b. cath 2014: patent stents  . Cervical cancer ([place])   . Degenerative disc disease   . Myocardial infarction ([place])    ~39yrs ago  . NSTEMI (non-ST elevated myocardial infarction) ([place]) 06/24/2011   a. NSTEMI 2013: likely spontaneous dissection/ruptured plaque in OM3 - PCI: 3 overlapping Promus DES to OM3, EF 55%     Family History  Problem Relation Age of Onset  . Alzheimer's disease Maternal Uncle   . Coronary artery disease Mother        MI in her 56s  . Early death Father   . Brain cancer Sister   . Drug abuse Son   . Asthma Daughter   . Anxiety disorder Daughter     Past Surgical History:  Procedure Laterality Date  . APPENDECTOMY    . [place] bunionectomy right  February 2015  . CARDIAC CATHETERIZATION N/A 01/26/2016   Procedure: Left Heart Cath and Coronary Angiography;  Surgeon: Belva Crome, MD;  Location: [facility];  Service: Cardiovascular;  Laterality: N/A;  . LEFT HEART CATHETERIZATION WITH CORONARY ANGIOGRAM N/A 06/24/2011   Procedure: LEFT HEART CATHETERIZATION WITH CORONARY ANGIOGRAM;  Surgeon: Jettie Booze, MD;  Location: [facility] CATH LAB;  Service: Cardiovascular;  Laterality: N/A;  . PERCUTANEOUS CORONARY STENT INTERVENTION (PCI-S) Right 06/24/2011   Procedure: PERCUTANEOUS CORONARY STENT INTERVENTION (PCI-S);  Surgeon: Jettie Booze, MD;  Location: [facility] CATH LAB;  Service: Cardiovascular;  Laterality:  Right;  . TUBAL LIGATION     Social History   Occupational History  . Occupation: Customer service manager  Tobacco Use  . Smoking status: Former Smoker    Types: Cigarettes  . Smokeless tobacco: Never Used  . Tobacco comment: quit over 30 years ago   Vaping Use  . Vaping Use: Never used  Substance and Sexual Activity  . Alcohol use: Yes    Alcohol/week: 0.0 standard drinks    Comment: daily  . Drug use: No  . Sexual activity: Yes    Birth control/protection: Condom

## 2019-07-30 NOTE — Telephone Encounter (Signed)
Called and lmomed the pt to call us back and let us know if they are taking the praluent so that I may process a pa for it

## 2019-08-04 ENCOUNTER — Other Ambulatory Visit: Payer: Self-pay | Admitting: Family Medicine

## 2019-08-05 ENCOUNTER — Other Ambulatory Visit: Payer: Self-pay

## 2019-08-05 ENCOUNTER — Ambulatory Visit (INDEPENDENT_AMBULATORY_CARE_PROVIDER_SITE_OTHER): Payer: 59 | Admitting: Neurology

## 2019-08-05 DIAGNOSIS — R404 Transient alteration of awareness: Secondary | ICD-10-CM

## 2019-08-07 ENCOUNTER — Encounter: Payer: Self-pay | Admitting: Family Medicine

## 2019-08-08 ENCOUNTER — Telehealth: Payer: Self-pay | Admitting: Cardiovascular Disease

## 2019-08-08 MED ORDER — PRALUENT 75 MG/ML ~~LOC~~ SOAJ: 1.0000 "pen " | SUBCUTANEOUS | 11 refills | Status: DC

## 2019-08-08 NOTE — Addendum Note (Signed)
Addended by: Marcelle Overlie D on: 08/08/2019 05:04 PM   Modules accepted: Orders

## 2019-08-08 NOTE — Telephone Encounter (Addendum)
Patient called and stated she has been off of Praluent for a few months, but she wants to resume. Requesting me renew her PA. Will go ahead and submit prior auth. New Rx sent to [facility] per pt request. Advised PA still good, so she can go ahead and refill now.

## 2019-08-08 NOTE — Telephone Encounter (Signed)
[facility] not [facility]. Called patient and LVM asking if she would like rx sent to a larger chain like [facility] who should be able to order or to mail order.

## 2019-08-08 NOTE — Addendum Note (Signed)
Addended by: Marcelle Overlie D on: 08/08/2019 12:35 PM   Modules accepted: Orders

## 2019-08-08 NOTE — Telephone Encounter (Signed)
Maria Bishop from [facility] called and is stating that [name] is unavailable, and that she needs a different script sent in.

## 2019-08-08 NOTE — Telephone Encounter (Signed)
Spoke with patient. Will send rx to [facility]. advised she can get copay card at [email or web address]

## 2019-08-12 NOTE — Telephone Encounter (Signed)
Pa submitted

## 2019-08-18 ENCOUNTER — Ambulatory Visit: Payer: 59 | Admitting: Neurology

## 2019-08-18 ENCOUNTER — Telehealth: Payer: Self-pay | Admitting: Neurology

## 2019-08-18 NOTE — Telephone Encounter (Signed)
Pt has PCP visit 08/20/2019 and would like EEG results before this visit. Accidentally came in here today for a visit that had been previously cancelled. Told her I'd let Dr Delice Lesch know she is wanting the results and let her know, she verbalized understanding.

## 2019-08-18 NOTE — Telephone Encounter (Signed)
Pls let her know that the brain wave test was normal, no evidence of seizure activity. I will send results to Dr. Birdie Riddle as well. I would like to do Neurocognitive testing to further evaluate memory and word-finding difficulties. If she is agreeable, pls order Neuropsych testing and left front staff know to schedule, thanks

## 2019-08-18 NOTE — Telephone Encounter (Signed)
Called, unable to leave VM

## 2019-08-18 NOTE — Telephone Encounter (Signed)
Patient came in today thinking she had an appointment today, but it had been cancelled back in May. She had forgotten it was cancelled. She would like a call to go over her EMG results from 08/05/19, then she will reschedule based on the results.

## 2019-08-18 NOTE — Procedures (Signed)
ELECTROENCEPHALOGRAM REPORT  Dates of Recording: 08/05/2019 12:27PM to 08/07/2019 12:21PM Patient's Name: Maria Bishop MRN: [id number] Date of Birth: 21-Jan-1961  Referring Provider: Dr. Ellouise Newer  Procedure: 48-hour ambulatory video EEG  History: This is a 59 year old woman with recurrent episodes of internal shaking sensation, word-finding difficulties.   Medications:  LYRICA 75 MG capsule PRALUENT 75 MG/ML SOAJ XANAX 0.5 MG tablet aspirin 81 MG tablet BC HEADACHE POWDER PO WELLBUTRIN XL 150 MG 24 hr tablet BUSPAR 7.5 MG tablet VITAMIN D3 25 MCG (1000 UT) CAPS FLEXERIL 10 MG tablet ALLEGRA 180 MG tablet PROZAC 40 MG capsule ADVIL,MOTRIN 200 MG tablet NITROSTAT 0.4 MG SL tablet PROTONIX 40 MG tablet ULTRAM 50 MG tablet AMBIEN 10 MG tablet   Technical Summary: This is a 48-hour multichannel digital video EEG recording measured by the international 10-20 system with electrodes applied with paste and impedances below 5000 ohms performed as portable with EKG monitoring.  The digital EEG was referentially recorded, reformatted, and digitally filtered in a variety of bipolar and referential montages for optimal display.    DESCRIPTION OF RECORDING: During maximal wakefulness, the background activity consisted of a symmetric 10 Hz posterior dominant rhythm which was reactive to eye opening.  There were no epileptiform discharges or focal slowing seen in wakefulness.  During the recording, the patient progresses through wakefulness, drowsiness, and Stage 2 sleep.  Again, there were no epileptiform discharges seen.  Events: On 6/29 at 1451 hours, patient reports tingles on the left side. She is not on video. Electrographically, there were no EEG or EKG changes seen.  On 6/29 at 1551 hours, she has a headache. Patient not on video. Electrographically, there were no EEG or EKG changes seen.  On 6/30 at 1039 hours, she has a headache, blurred vision, dizzy, nerves. Not on video  at this time. Electrographically, there were no EEG or EKG changes seen.  There were no electrographic seizures seen.  EKG lead was unremarkable.  IMPRESSION: This 48-hour ambulatory video EEG study is normal.    CLINICAL CORRELATION: A normal EEG does not exclude a clinical diagnosis of epilepsy. Episodes of left tingling, headache, blurred vision, dizziness, nerves, did not show any electrographic correlate.  If further clinical questions remain, inpatient video EEG monitoring may be helpful.   Ellouise Newer, M.D.

## 2019-08-19 NOTE — Telephone Encounter (Signed)
Left detailed VM for pt with message per Dr Aquino's notes -   Pls let her know that the brain wave test was normal, no evidence of seizure activity. I will send results to Dr. Birdie Riddle as well. I would like to do Neurocognitive testing to further evaluate memory and word-finding difficulties. If she is agreeable, pls order Neuropsych testing and left front staff know to schedule.  Requested she return call to give thoughts on doing Neuropsych testing and will proceed if she's agreeable.

## 2019-08-20 ENCOUNTER — Ambulatory Visit: Payer: 59 | Admitting: Family Medicine

## 2019-08-20 ENCOUNTER — Encounter: Payer: Self-pay | Admitting: Family Medicine

## 2019-08-20 ENCOUNTER — Other Ambulatory Visit: Payer: Self-pay

## 2019-08-20 DIAGNOSIS — F5081 Binge eating disorder: Secondary | ICD-10-CM

## 2019-08-20 DIAGNOSIS — F418 Other specified anxiety disorders: Secondary | ICD-10-CM | POA: Diagnosis not present

## 2019-08-20 DIAGNOSIS — F50819 Binge eating disorder, unspecified: Secondary | ICD-10-CM | POA: Insufficient documentation

## 2019-08-20 MED ORDER — LISDEXAMFETAMINE DIMESYLATE 50 MG PO CAPS: 50.0000 mg | ORAL_CAPSULE | Freq: Every day | ORAL | 0 refills | Status: DC

## 2019-08-20 MED ORDER — LISDEXAMFETAMINE DIMESYLATE 30 MG PO CAPS: 30.0000 mg | ORAL_CAPSULE | Freq: Every day | ORAL | 0 refills | Status: DC

## 2019-08-20 NOTE — Assessment & Plan Note (Signed)
Pt's BMI is 37 and associated w/ hyperlipidemia and CAD- thus qualifying as morbidly obese.  She is interested in losing weight but doesn't feel she can do it on her own.  She admits to binge eating when she gets home due to her emotional distress.  Will start Vyvanse as this is the only approved medication for binge eating disorder and follow closely.  Pt expressed understanding and is in agreement w/ plan.

## 2019-08-20 NOTE — Assessment & Plan Note (Signed)
Improving since stopping Wellbutrin and Buspar but pt is in a very transitional time right now as boyfriend of 10 yrs told her to move out.  Will follow closely.

## 2019-08-20 NOTE — Progress Notes (Signed)
   Subjective:    Patient ID: Maria Bishop, female    DOB: Dec 31, 1960, 59 y.o.   MRN: [id number]  HPI Obesity- 'I feel like I need a kickstart to lose weight b/c mentally I can't do it on my own'.  Pt has attempted exercise in the past but doesn't stick with it.  Reports she doesn't eat much during the day but will binge eat when she gets home.  When her depression worsens, she will binge eat.  'my whole life is turned upside down right now'.  + emotional eating.  Anxiety/depression- boyfriend of 10 yrs told her that he wants her to move out.  Is now looking for a place to live.  Pt reports she is feeling better since stopping Wellbutrin and Buspar but is under quite a bit of stress.  Admits to emotional eating.   Review of Systems For ROS see HPI   This visit occurred during the SARS-CoV-2 public health emergency.  Safety protocols were in place, including screening questions prior to the visit, additional usage of staff PPE, and extensive cleaning of exam room while observing appropriate contact time as indicated for disinfecting solutions.       Objective:   Physical Exam Vitals reviewed.  Constitutional:      General: She is not in acute distress.    Appearance: She is obese.  HENT:     Head: Normocephalic and atraumatic.  Cardiovascular:     Rate and Rhythm: Normal rate and regular rhythm.     Pulses: Normal pulses.  Pulmonary:     Effort: Pulmonary effort is normal. No respiratory distress.  Skin:    General: Skin is warm and dry.  Neurological:     Mental Status: She is alert and oriented to person, place, and time.  Psychiatric:        Mood and Affect: Mood normal.        Behavior: Behavior normal.        Thought Content: Thought content normal.           Assessment & Plan:

## 2019-08-20 NOTE — Patient Instructions (Signed)
Follow up in 6 weeks to recheck Vyvanse and weight loss START the Vyvanse 30mg  daily x2 weeks and then increase to 50mg  daily Use the Vyvanse in addition to healthy diet and regular exercise- you can do it! If you find yourself tempted to binge eat- redirect that energy into another activity Call with any questions or concerns You've got this!

## 2019-08-20 NOTE — Assessment & Plan Note (Signed)
New.  Pt admits that she binge eats more nights than not when she gets home from work.  She doesn't want to but feels she doesn't have control over it.  Discussed impulse control and the role of Vyvanse.  Will start 30mg  x2 weeks and then increase to 50mg  daily.  Pt expressed understanding and is in agreement w/ plan.

## 2019-08-25 ENCOUNTER — Encounter: Payer: Self-pay | Admitting: Orthopaedic Surgery

## 2019-08-25 NOTE — Telephone Encounter (Signed)
Rest, ice, elevate and take nsaids as needed.  Back off activity if needed.  We can also submit for visco injection if she would like

## 2019-09-01 ENCOUNTER — Other Ambulatory Visit: Payer: Self-pay | Admitting: Family Medicine

## 2019-09-07 HISTORY — PX: JOINT REPLACEMENT: SHX530

## 2019-09-09 ENCOUNTER — Other Ambulatory Visit: Payer: Self-pay

## 2019-09-09 ENCOUNTER — Ambulatory Visit: Payer: 59 | Admitting: Orthopaedic Surgery

## 2019-09-09 ENCOUNTER — Encounter: Payer: Self-pay | Admitting: Orthopaedic Surgery

## 2019-09-09 VITALS — Ht 62.5 in | Wt 202.2 lb

## 2019-09-09 DIAGNOSIS — M1712 Unilateral primary osteoarthritis, left knee: Secondary | ICD-10-CM

## 2019-09-09 NOTE — Progress Notes (Signed)
Office Visit Note   Patient: Maria Bishop           Date of Birth: Dec 17, 1960           MRN: [id number] Visit Date: 09/09/2019              Requested by: [name], MD [address] [place],  [place] [address] PCP: [name], MD   Assessment & Plan: Visit Diagnoses:  1. Primary osteoarthritis of left knee     Plan: With ongoing symptoms despite aspiration and injection I have recommended an MRI to rule out structural abnormalities.  My suspicion is that she has advanced patellofemoral DJD that is causing the catching pain.  Follow-Up Instructions: Return if symptoms worsen or fail to improve.   Orders:  No orders of the defined types were placed in this encounter.  No orders of the defined types were placed in this encounter.     Procedures: No procedures performed   Clinical Data: No additional findings.   Subjective: Chief Complaint  Patient presents with  . Left Knee - Pain    Patient returns today for chronic left knee pain.  She states that she is feeling better but she continues to have catching pain in the left knee anteriorly.  She feels pain in the back of the knee as well.  Ibuprofen provides temporary relief.  She has been wearing a copper fit sleeve on her knee at work.   Review of Systems   Objective: Vital Signs: Ht 5' 2.5" (1.588 m)   Wt 202 lb 3.2 oz (91.7 kg)   LMP 07/05/2012   BMI 36.39 kg/m   Physical Exam  Ortho Exam Left knee shows no joint effusion.  No joint line tenderness.  Pain and crepitus with range of motion.    Specialty Comments:  No specialty comments available.  Imaging: No results found.   PMFS History: Patient Active Problem List   Diagnosis Date Noted  . Binge eating disorder 08/20/2019  . Primary osteoarthritis of left knee 07/30/2019  . Morbid obesity ([place]) 03/10/2019  . Primary osteoarthritis of both knees 03/27/2018  . Primary osteoarthritis of both hands 03/27/2018  . DDD  (degenerative disc disease), lumbar 03/27/2018  . DDD (degenerative disc disease), cervical 03/27/2018  . Vitamin D deficiency 01/24/2018  . Chest pain 01/25/2016  . Lumbar radiculopathy 08/06/2015  . Memory loss 12/02/2014  . History of cervical cancer 12/02/2014  . History of MI (myocardial infarction) 12/02/2014  . Insomnia 08/31/2014  . GERD (gastroesophageal reflux disease) 07/08/2013  . Abdominal pain, epigastric 07/08/2013  . Screening for malignant neoplasm of cervix 07/19/2012  . Routine general medical examination at a health care facility 07/19/2012  . Depression with anxiety 06/06/2012  . Hyperlipidemia 09/06/2011  . CAD (coronary artery disease) 06/24/2001   Past Medical History:  Diagnosis Date  . CAD (coronary artery disease)    a. NSTEMI 2013: likely spontaneous dissection/ruptured plaque in OM3 - PCI: 3 overlapping Promus DES to OM3, EF 55% b. cath 2014: patent stents  . Cervical cancer ([place])   . Degenerative disc disease   . Myocardial infarction ([place])    ~72yrs ago  . NSTEMI (non-ST elevated myocardial infarction) ([place]) 06/24/2011   a. NSTEMI 2013: likely spontaneous dissection/ruptured plaque in OM3 - PCI: 3 overlapping Promus DES to OM3, EF 55%     Family History  Problem Relation Age of Onset  . Alzheimer's disease Maternal Uncle   . Coronary  artery disease Mother        MI in her 51s  . Early death Father   . Brain cancer Sister   . Drug abuse Son   . Asthma Daughter   . Anxiety disorder Daughter     Past Surgical History:  Procedure Laterality Date  . APPENDECTOMY    . [place] bunionectomy right  February 2015  . CARDIAC CATHETERIZATION N/A 01/26/2016   Procedure: Left Heart Cath and Coronary Angiography;  Surgeon: Belva Crome, MD;  Location: [place];  Service: Cardiovascular;  Laterality: N/A;  . LEFT HEART CATHETERIZATION WITH CORONARY ANGIOGRAM N/A 06/24/2011   Procedure: LEFT HEART CATHETERIZATION WITH CORONARY ANGIOGRAM;  Surgeon:  Jettie Booze, MD;  Location: [facility] CATH LAB;  Service: Cardiovascular;  Laterality: N/A;  . PERCUTANEOUS CORONARY STENT INTERVENTION (PCI-S) Right 06/24/2011   Procedure: PERCUTANEOUS CORONARY STENT INTERVENTION (PCI-S);  Surgeon: Jettie Booze, MD;  Location: [facility] CATH LAB;  Service: Cardiovascular;  Laterality: Right;  . TUBAL LIGATION     Social History   Occupational History  . Occupation: Customer service manager  Tobacco Use  . Smoking status: Former Smoker    Types: Cigarettes  . Smokeless tobacco: Never Used  . Tobacco comment: quit over 30 years ago   Vaping Use  . Vaping Use: Never used  Substance and Sexual Activity  . Alcohol use: Yes    Alcohol/week: 0.0 standard drinks    Comment: daily  . Drug use: No  . Sexual activity: Yes    Birth control/protection: Condom

## 2019-09-14 ENCOUNTER — Ambulatory Visit (INDEPENDENT_AMBULATORY_CARE_PROVIDER_SITE_OTHER): Payer: 59

## 2019-09-14 ENCOUNTER — Other Ambulatory Visit: Payer: Self-pay

## 2019-09-14 DIAGNOSIS — M1712 Unilateral primary osteoarthritis, left knee: Secondary | ICD-10-CM | POA: Diagnosis not present

## 2019-09-14 NOTE — Progress Notes (Signed)
Needs appt

## 2019-09-15 ENCOUNTER — Telehealth: Payer: Self-pay | Admitting: Orthopaedic Surgery

## 2019-09-15 NOTE — Telephone Encounter (Signed)
Result note in patient chart that she needs ROV to review MRI. Lauren has tried to call with no answer.

## 2019-09-15 NOTE — Telephone Encounter (Signed)
Patient called asked for a call back concerning maybe getting set up for surgery. Patient said her insurance coverage  Will end at the end of the month. The number to contact patient is [phone]

## 2019-09-15 NOTE — Telephone Encounter (Signed)
No surgery ordered yet.

## 2019-09-15 NOTE — Telephone Encounter (Signed)
See below

## 2019-09-16 ENCOUNTER — Telehealth: Payer: Self-pay | Admitting: Orthopaedic Surgery

## 2019-09-16 NOTE — Telephone Encounter (Signed)
Pt called stating her insurance will end at the end of this month and she would like to get scheduled for any surgeries she may need before the 31st

## 2019-09-16 NOTE — Telephone Encounter (Signed)
Patient would like a call about TKA and cost.  Looking at surgery before end of month if possible.

## 2019-09-17 ENCOUNTER — Encounter: Payer: Self-pay | Admitting: Family Medicine

## 2019-09-17 NOTE — Telephone Encounter (Signed)
Patient needs appt to discuss

## 2019-09-19 ENCOUNTER — Telehealth: Payer: Self-pay | Admitting: Orthopaedic Surgery

## 2019-09-19 NOTE — Telephone Encounter (Signed)
Patient called. She would like Dr. Erlinda Hong to know that she is ready for surgery.

## 2019-09-21 ENCOUNTER — Other Ambulatory Visit: Payer: Self-pay | Admitting: Family Medicine

## 2019-09-22 ENCOUNTER — Encounter: Payer: Self-pay | Admitting: Orthopaedic Surgery

## 2019-09-22 ENCOUNTER — Other Ambulatory Visit: Payer: Self-pay | Admitting: Family Medicine

## 2019-09-22 MED ORDER — LISDEXAMFETAMINE DIMESYLATE 50 MG PO CAPS: 50.0000 mg | ORAL_CAPSULE | Freq: Every day | ORAL | 0 refills | Status: DC

## 2019-09-22 NOTE — Telephone Encounter (Signed)
Last OV 08/20/19 Vyvanse last filled 08/20/19 #30 with 0

## 2019-09-22 NOTE — Telephone Encounter (Signed)
Patient ready to schedule TKA.  Thanks.

## 2019-09-23 ENCOUNTER — Ambulatory Visit: Payer: 59 | Admitting: Orthopaedic Surgery

## 2019-09-23 NOTE — Telephone Encounter (Signed)
Spoke with patient yesterday and scheduled surgery. 

## 2019-09-24 ENCOUNTER — Other Ambulatory Visit: Payer: Self-pay

## 2019-09-24 NOTE — Patient Instructions (Addendum)
DUE TO COVID-19 ONLY ONE VISITOR IS ALLOWED TO COME WITH YOU AND STAY IN THE WAITING ROOM ONLY DURING PRE  OP AND PROCEDURE.   IF YOU WILL BE ADMITTED INTO THE HOSPITAL YOU ARE ALLOWED ONE SUPPORT PERSON DURING VISITATION HOURS ONLY  (10AM -8PM)   . The support person may change daily. . The support person must pass our screening, gel in and out, and wear a mask at all times, including in the patient's room. . Patients must also wear a mask when staff or their support person are in the room.   COVID SWAB TESTING MUST BE COMPLETED ON:  09-29-19 @ 9:50 AM    [address] [place], [place] [address]  (Must self quarantine after testing. Follow instructions on handout.)        Your procedure is scheduled on: Wednesday, 10-01-19   Report to [facility] Main  Entrance     Report to admitting at 11:00 AM   Call this number if you have problems the morning of surgery [phone]   Do not eat food :After Midnight.   May have liquids until 10:30 AM   day of surgery  CLEAR LIQUID DIET  Foods Allowed                                                                     Foods Excluded  Water, Black Coffee and tea, regular and decaf             liquids that you cannot  Plain Jell-O in any flavor  (No red)                                    see through such as: Fruit ices (not with fruit pulp)                                      milk, soups, orange juice              Iced Popsicles (No red)                                      All solid food                                   Apple juices Sports drinks like Gatorade (No red) Lightly seasoned clear broth or consume(fat free) Sugar, honey syrup    Complete one Ensure drink the morning of surgery at  10:30 the day of surgery.    Oral Hygiene is also important to reduce your risk of infection.                                     Remember - BRUSH YOUR TEETH THE MORNING OF SURGERY WITH YOUR REGULAR TOOTHPASTE   Do NOT smoke after  Midnight   Take these  medicines the morning of surgery with A SIP OF WATER: Alprazolam, Fexofenadine, Fluoxetine, Lisdexamfetamine,  Pantoprazole, Pregabalin                                You may not have any metal on your body including hair pins, jewelry, and body piercings             Do not wear make-up, lotions, powders, perfumes/cologne, or deodorant             Do not wear nail polish.  Do not shave  48 hours prior to surgery.     Do not bring valuables to the hospital. [place].   Contacts, dentures or bridgework may not be worn into surgery.   Bring small overnight bag day of surgery.      Special Instructions: Bring a copy of your healthcare power of attorney and living will documents         the day of surgery if you haven't  scanned them in before.              Please read over the following fact sheets you were given: IF YOU HAVE QUESTIONS ABOUT YOUR PRE OP INSTRUCTIONS PLEASE  CALL [phone]   [place] - Preparing for Surgery Before surgery, you can play an important role.  Because skin is not sterile, your skin needs to be as free of germs as possible.  You can reduce the number of germs on your skin by washing with CHG (chlorahexidine gluconate) soap before surgery.  CHG is an antiseptic cleaner which kills germs and bonds with the skin to continue killing germs even after washing. Please DO NOT use if you have an allergy to CHG or antibacterial soaps.  If your skin becomes reddened/irritated stop using the CHG and inform your nurse when you arrive at Short Stay. Do not shave (including legs and underarms) for at least 48 hours prior to the first CHG shower.  You may shave your face/neck.  Please follow these instructions carefully:  1.  Shower with CHG Soap the night before surgery and the  morning of surgery.  2.  If you choose to wash your hair, wash your hair first as usual with your normal  shampoo.  3.  After you  shampoo, rinse your hair and body thoroughly to remove the shampoo.                             4.  Use CHG as you would any other liquid soap.  You can apply chg directly to the skin and wash.  Gently with a scrungie or clean washcloth.  5.  Apply the CHG Soap to your body ONLY FROM THE NECK DOWN.   Do   not use on face/ open                           Wound or open sores. Avoid contact with eyes, ears mouth and   genitals (private parts).                       Wash face,  Genitals (private parts) with your normal soap.             6.  Wash thoroughly, paying special attention to the  area where your    surgery  will be performed.  7.  Thoroughly rinse your body with warm water from the neck down.  8.  DO NOT shower/wash with your normal soap after using and rinsing off the CHG Soap.                9.  Pat yourself dry with a clean towel.            10.  Wear clean pajamas.            11.  Place clean sheets on your bed the night of your first shower and do not  sleep with pets. Day of Surgery : Do not apply any lotions/deodorants the morning of surgery.  Please wear clean clothes to the hospital/surgery center.  FAILURE TO FOLLOW THESE INSTRUCTIONS MAY RESULT IN THE CANCELLATION OF YOUR SURGERY  PATIENT SIGNATURE_________________________________  NURSE SIGNATURE__________________________________  ________________________________________________________________________  Maria Bishop  An incentive spirometer is a tool that can help keep your lungs clear and active. This tool measures how well you are filling your lungs with each breath. Taking long deep breaths may help reverse or decrease the chance of developing breathing (pulmonary) problems (especially infection) following:  A long period of time when you are unable to move or be active. BEFORE THE PROCEDURE   If the spirometer includes an indicator to show your best effort, your nurse or respiratory therapist will set it to a  desired goal.  If possible, sit up straight or lean slightly forward. Try not to slouch.  Hold the incentive spirometer in an upright position. INSTRUCTIONS FOR USE  1. Sit on the edge of your bed if possible, or sit up as far as you can in bed or on a chair. 2. Hold the incentive spirometer in an upright position. 3. Breathe out normally. 4. Place the mouthpiece in your mouth and seal your lips tightly around it. 5. Breathe in slowly and as deeply as possible, raising the piston or the ball toward the top of the column. 6. Hold your breath for 3-5 seconds or for as long as possible. Allow the piston or ball to fall to the bottom of the column. 7. Remove the mouthpiece from your mouth and breathe out normally. 8. Rest for a few seconds and repeat Steps 1 through 7 at least 10 times every 1-2 hours when you are awake. Take your time and take a few normal breaths between deep breaths. 9. The spirometer may include an indicator to show your best effort. Use the indicator as a goal to work toward during each repetition. 10. After each set of 10 deep breaths, practice coughing to be sure your lungs are clear. If you have an incision (the cut made at the time of surgery), support your incision when coughing by placing a pillow or rolled up towels firmly against it. Once you are able to get out of bed, walk around indoors and cough well. You may stop using the incentive spirometer when instructed by your caregiver.  RISKS AND COMPLICATIONS  Take your time so you do not get dizzy or light-headed.  If you are in pain, you may need to take or ask for pain medication before doing incentive spirometry. It is harder to take a deep breath if you are having pain. AFTER USE  Rest and breathe slowly and easily.  It can be helpful to keep track of a log of your progress. Your caregiver can provide you with  a simple table to help with this. If you are using the spirometer at home, follow these  instructions: [place] IF:   You are having difficultly using the spirometer.  You have trouble using the spirometer as often as instructed.  Your pain medication is not giving enough relief while using the spirometer.  You develop fever of 100.5 F (38.1 C) or higher. SEEK IMMEDIATE MEDICAL CARE IF:   You cough up bloody sputum that had not been present before.  You develop fever of 102 F (38.9 C) or greater.  You develop worsening pain at or near the incision site. MAKE SURE YOU:   Understand these instructions.  Will watch your condition.  Will get help right away if you are not doing well or get worse. Document Released: 06/05/2006 Document Revised: 04/17/2011 Document Reviewed: 08/06/2006 Exit[employer] Patient Information 2014 Exit[employer], [place].   ________________________________________________________________________  WHAT IS A BLOOD TRANSFUSION? Blood Transfusion Information  A transfusion is the replacement of blood or some of its parts. Blood is made up of multiple cells which provide different functions.  Red blood cells carry oxygen and are used for blood loss replacement.  White blood cells fight against infection.  Platelets control bleeding.  Plasma helps clot blood.  Other blood products are available for specialized needs, such as hemophilia or other clotting disorders. BEFORE THE TRANSFUSION  Who gives blood for transfusions?   Healthy volunteers who are fully evaluated to make sure their blood is safe. This is blood bank blood. Transfusion therapy is the safest it has ever been in the practice of medicine. Before blood is taken from a donor, a complete history is taken to make sure that person has no history of diseases nor engages in risky social behavior (examples are intravenous drug use or sexual activity with multiple partners). The donor's travel history is screened to minimize risk of transmitting infections, such as malaria. The donated  blood is tested for signs of infectious diseases, such as HIV and hepatitis. The blood is then tested to be sure it is compatible with you in order to minimize the chance of a transfusion reaction. If you or a relative donates blood, this is often done in anticipation of surgery and is not appropriate for emergency situations. It takes many days to process the donated blood. RISKS AND COMPLICATIONS Although transfusion therapy is very safe and saves many lives, the main dangers of transfusion include:   Getting an infectious disease.  Developing a transfusion reaction. This is an allergic reaction to something in the blood you were given. Every precaution is taken to prevent this. The decision to have a blood transfusion has been considered carefully by your caregiver before blood is given. Blood is not given unless the benefits outweigh the risks. AFTER THE TRANSFUSION  Right after receiving a blood transfusion, you will usually feel much better and more energetic. This is especially true if your red blood cells have gotten low (anemic). The transfusion raises the level of the red blood cells which carry oxygen, and this usually causes an energy increase.  The nurse administering the transfusion will monitor you carefully for complications. HOME CARE INSTRUCTIONS  No special instructions are needed after a transfusion. You may find your energy is better. Speak with your caregiver about any limitations on activity for underlying diseases you may have. SEEK MEDICAL CARE IF:   Your condition is not improving after your transfusion.  You develop redness or irritation at the intravenous (IV) site. SEEK  IMMEDIATE MEDICAL CARE IF:  Any of the following symptoms occur over the next 12 hours:  Shaking chills.  You have a temperature by mouth above 102 F (38.9 C), not controlled by medicine.  Chest, back, or muscle pain.  People around you feel you are not acting correctly or are  confused.  Shortness of breath or difficulty breathing.  Dizziness and fainting.  You get a rash or develop hives.  You have a decrease in urine output.  Your urine turns a dark color or changes to pink, red, or brown. Any of the following symptoms occur over the next 10 days:  You have a temperature by mouth above 102 F (38.9 C), not controlled by medicine.  Shortness of breath.  Weakness after normal activity.  The white part of the eye turns yellow (jaundice).  You have a decrease in the amount of urine or are urinating less often.  Your urine turns a dark color or changes to pink, red, or brown. Document Released: 01/21/2000 Document Revised: 04/17/2011 Document Reviewed: 09/09/2007 [facility] Patient Information 2014 [place], [place].  _______________________________________________________________________

## 2019-09-24 NOTE — Progress Notes (Addendum)
COVID Vaccine Completed: x2 Date COVID Vaccine completed: 04-07-2019 & 04-28-2019 COVID vaccine manufacturer: Pfizer    Moderna   [employer]   PCP - Annye Asa, MD Cardiologist -  Dr. Burt Knack, has not seen recently.    Chest x-ray -  EKG - 09-29-19 Stress Test -  ECHO -  Cardiac Cath - 01-26-2016 in Epic  Sleep Study -  CPAP -   Fasting Blood Sugar -  Checks Blood Sugar _____ times a day  Blood Thinner Instructions: Aspirin Instructions: ASA 81 mg.  Patient will check with surgeon Last Dose:  Anesthesia review:  Hx of MI 2011, CAD, cardiac stents  Patient denies shortness of breath, fever, cough and chest pain at PAT appointment   Patient verbalized understanding of instructions that were given to them at the PAT appointment. Patient was also instructed that they will need to review over the PAT instructions again at home before surgery.

## 2019-09-29 ENCOUNTER — Encounter (HOSPITAL_COMMUNITY)
Admission: RE | Admit: 2019-09-29 | Discharge: 2019-09-29 | Disposition: A | Discharge status: Home / Self Care | Payer: 59 | Source: Ambulatory Visit | Attending: Orthopaedic Surgery | Admitting: Orthopaedic Surgery

## 2019-09-29 ENCOUNTER — Other Ambulatory Visit (HOSPITAL_COMMUNITY): Payer: 59

## 2019-09-29 ENCOUNTER — Ambulatory Visit (HOSPITAL_COMMUNITY)
Admission: RE | Admit: 2019-09-29 | Discharge: 2019-09-29 | Disposition: A | Discharge status: Home / Self Care | Payer: 59 | Source: Ambulatory Visit | Attending: Physician Assistant | Admitting: Physician Assistant

## 2019-09-29 ENCOUNTER — Other Ambulatory Visit: Payer: Self-pay

## 2019-09-29 ENCOUNTER — Encounter (HOSPITAL_COMMUNITY): Payer: Self-pay

## 2019-09-29 ENCOUNTER — Other Ambulatory Visit (HOSPITAL_COMMUNITY)
Admission: RE | Admit: 2019-09-29 | Discharge: 2019-09-29 | Disposition: A | Discharge status: Home / Self Care | Payer: 59 | Source: Ambulatory Visit | Attending: Orthopaedic Surgery | Admitting: Orthopaedic Surgery

## 2019-09-29 DIAGNOSIS — Z20822 Contact with and (suspected) exposure to covid-19: Secondary | ICD-10-CM | POA: Insufficient documentation

## 2019-09-29 DIAGNOSIS — M1712 Unilateral primary osteoarthritis, left knee: Secondary | ICD-10-CM | POA: Insufficient documentation

## 2019-09-29 DIAGNOSIS — Z01812 Encounter for preprocedural laboratory examination: Secondary | ICD-10-CM | POA: Insufficient documentation

## 2019-09-29 HISTORY — DX: Pneumonia, unspecified organism: J18.9

## 2019-09-29 HISTORY — DX: Methicillin resistant Staphylococcus aureus infection, unspecified site: A49.02

## 2019-09-29 HISTORY — DX: Anxiety disorder, unspecified: F41.9

## 2019-09-29 HISTORY — DX: Headache, unspecified: R51.9

## 2019-09-29 HISTORY — DX: Depression, unspecified: F32.A

## 2019-09-29 HISTORY — DX: Gastro-esophageal reflux disease without esophagitis: K21.9

## 2019-09-29 HISTORY — DX: Cardiac murmur, unspecified: R01.1

## 2019-09-29 LAB — CBC WITH DIFFERENTIAL/PLATELET
Abs Immature Granulocytes: 0.01 10*3/uL (ref 0.00–0.07)
Basophils Absolute: 0 10*3/uL (ref 0.0–0.1)
Basophils Relative: 0 %
Eosinophils Absolute: 0.1 10*3/uL (ref 0.0–0.5)
Eosinophils Relative: 3 %
HCT: 40.4 % (ref 36.0–46.0)
Hemoglobin: 13.6 g/dL (ref 12.0–15.0)
Immature Granulocytes: 0 %
Lymphocytes Relative: 26 %
Lymphs Abs: 0.9 10*3/uL (ref 0.7–4.0)
MCH: 30.6 pg (ref 26.0–34.0)
MCHC: 33.7 g/dL (ref 30.0–36.0)
MCV: 90.8 fL (ref 80.0–100.0)
Monocytes Absolute: 0.2 10*3/uL (ref 0.1–1.0)
Monocytes Relative: 6 %
Neutro Abs: 2.4 10*3/uL (ref 1.7–7.7)
Neutrophils Relative %: 65 %
Platelets: 231 10*3/uL (ref 150–400)
RBC: 4.45 MIL/uL (ref 3.87–5.11)
RDW: 13.2 % (ref 11.5–15.5)
WBC: 3.7 10*3/uL — ABNORMAL LOW (ref 4.0–10.5)
nRBC: 0 % (ref 0.0–0.2)

## 2019-09-29 LAB — COMPREHENSIVE METABOLIC PANEL
ALT: 16 U/L (ref 0–44)
AST: 19 U/L (ref 15–41)
Albumin: 4.3 g/dL (ref 3.5–5.0)
Alkaline Phosphatase: 71 U/L (ref 38–126)
Anion gap: 6 (ref 5–15)
BUN: 11 mg/dL (ref 6–20)
CO2: 27 mmol/L (ref 22–32)
Calcium: 9.2 mg/dL (ref 8.9–10.3)
Chloride: 106 mmol/L (ref 98–111)
Creatinine, Ser: 0.71 mg/dL (ref 0.44–1.00)
GFR calc Af Amer: >60 mL/min (ref >60)
GFR calc non Af Amer: >60 mL/min (ref >60)
Glucose, Bld: 100 mg/dL — ABNORMAL HIGH (ref 70–99)
Potassium: 3.9 mmol/L (ref 3.5–5.1)
Sodium: 139 mmol/L (ref 135–145)
Total Bilirubin: 0.8 mg/dL (ref 0.3–1.2)
Total Protein: 7.1 g/dL (ref 6.5–8.1)

## 2019-09-29 LAB — URINALYSIS, ROUTINE W REFLEX MICROSCOPIC
Bilirubin Urine: NEGATIVE
Glucose, UA: NEGATIVE mg/dL
Hgb urine dipstick: NEGATIVE
Ketones, ur: NEGATIVE mg/dL
Leukocytes,Ua: NEGATIVE
Nitrite: NEGATIVE
Protein, ur: NEGATIVE mg/dL
Specific Gravity, Urine: 1.012 (ref 1.005–1.030)
pH: 7 (ref 5.0–8.0)

## 2019-09-29 LAB — SARS CORONAVIRUS 2 (TAT 6-24 HRS): SARS Coronavirus 2: NEGATIVE

## 2019-09-29 LAB — PROTIME-INR
INR: 1 (ref 0.8–1.2)
Prothrombin Time: 12.7 seconds (ref 11.4–15.2)

## 2019-09-29 LAB — APTT: aPTT: 29 seconds (ref 24–36)

## 2019-09-29 LAB — SURGICAL PCR SCREEN
MRSA, PCR: NEGATIVE
Staphylococcus aureus: NEGATIVE

## 2019-09-30 MED ORDER — BUPIVACAINE LIPOSOME 1.3 % IJ SUSP
20.0000 mL | Freq: Once | INTRAMUSCULAR | Status: DC
  Filled 2019-09-30: qty 20

## 2019-09-30 NOTE — Anesthesia Preprocedure Evaluation (Addendum)
Anesthesia Evaluation  Patient identified by MRN, date of birth, ID band Patient awake    Reviewed: Allergy & Precautions, NPO status , Patient's Chart, lab work & pertinent test results  History of Anesthesia Complications Negative for: history of anesthetic complications  Airway Mallampati: II  TM Distance: >3 FB Neck ROM: Full    Dental no notable dental hx.    Pulmonary former smoker,    Pulmonary exam normal        Cardiovascular + CAD, + Past MI and + Cardiac Stents (2013)  Normal cardiovascular exam     Neuro/Psych Anxiety Depression negative neurological ROS     GI/Hepatic Neg liver ROS, GERD  Controlled and Medicated,  Endo/Other  negative endocrine ROS  Renal/GU negative Renal ROS  negative genitourinary   Musculoskeletal  (+) Arthritis ,   Abdominal   Peds  Hematology negative hematology ROS (+)   Anesthesia Other Findings Day of surgery medications reviewed with patient.  Reproductive/Obstetrics negative OB ROS                           Anesthesia Physical Anesthesia Plan  ASA: III  Anesthesia Plan: General   Post-op Pain Management:    Induction: Intravenous  PONV Risk Score and Plan: 4 or greater and Treatment may vary due to age or medical condition, Propofol infusion, Ondansetron, Dexamethasone and Midazolam  Airway Management Planned: LMA  Additional Equipment: None  Intra-op Plan:   Post-operative Plan: Extubation in OR  Informed Consent: I have reviewed the patients History and Physical, chart, labs and discussed the procedure including the risks, benefits and alternatives for the proposed anesthesia with the patient or authorized representative who has indicated his/her understanding and acceptance.     Dental advisory given  Plan Discussed with:   Anesthesia Plan Comments: (Discussed spinal vs GA, patient prefers GA. Daiva Huge, MD)      Anesthesia  Quick Evaluation

## 2019-09-30 NOTE — Progress Notes (Addendum)
Anesthesia Chart Review   Case: [id number] Date/Time: 10/01/19 1215   Procedure: LEFT TOTAL KNEE ARTHROPLASTY (Left Knee)   Anesthesia type: Choice   Pre-op diagnosis: left knee degenerative joint disease   Location: [facility] ROOM 05 / [facility]   Surgeons: Leandrew Koyanagi, MD      DISCUSSION:59 y.o. former smoker with h/o GERD, CAD (DES 2013, cardiac cath 01/2016 with widely patent stent), left knee DJD scheduled for above procedure 10/01/2019 with Dr. Frankey Shown.   Pt can climb a flight stairs without difficulty, only reports knee pain.  She can do housework without difficulty.  She follows with her PCP, no longer see cardiology.  Denies recurrence of cv sx.  Cardiac Cath in 2017 with patent coronary arteries, patent stent.   Discussed with Dr. Fransisco Beau.  Anticipate pt can proceed with planned procedure barring acute status change.   VS: BP 135/90   Pulse 95   Temp 37 C (Oral)   Resp 16   Ht 5' 2.5" (1.588 m)   Wt 89.4 kg   LMP 07/05/2012   SpO2 99%   BMI 35.46 kg/m   PROVIDERS: [name], MD is PCP last seen 08/20/2019  Sherren Mocha, MD is Cardiologist  LABS: Labs reviewed: Acceptable for surgery. (all labs ordered are listed, but only abnormal results are displayed)  Labs Reviewed  CBC WITH DIFFERENTIAL/PLATELET - Abnormal; Notable for the following components:      Result Value   WBC 3.7 (*)    All other components within normal limits  COMPREHENSIVE METABOLIC PANEL - Abnormal; Notable for the following components:   Glucose, Bld 100 (*)    All other components within normal limits  SURGICAL PCR SCREEN  APTT  PROTIME-INR  URINALYSIS, ROUTINE W REFLEX MICROSCOPIC  TYPE AND SCREEN     IMAGES:   EKG: 09/29/2019 Rate 96 bpm NSR  Low voltage QRS Borderline ECG Sinus tachycardia no longer present  CV: Cardiac Cath 01/26/2016  Widely patent coronary arteries. Right dominant coronary circulation. No evidence of obstructive coronary disease.  Widely patent  overlapping stents from the midportion of the third obtuse marginal into the mid circumflex. No evidence of restenosis.  Low normal LV function with EF 50%. Normal filling pressure   RECOMMENDATIONS:   No evidence of coronary obstruction to explain the patient's complaints.  Further workup per primary team.  Past Medical History:  Diagnosis Date  . Anxiety   . CAD (coronary artery disease)    a. NSTEMI 2013: likely spontaneous dissection/ruptured plaque in OM3 - PCI: 3 overlapping Promus DES to OM3, EF 55% b. cath 2014: patent stents  . Cervical cancer (HCC)    Cervical cancer  . Degenerative disc disease   . Depression   . GERD (gastroesophageal reflux disease)   . Headache   . Heart murmur   . MRSA infection   . Myocardial infarction ([place])    ~18yrs ago  . NSTEMI (non-ST elevated myocardial infarction) ([place]) 06/24/2011   a. NSTEMI 2013: likely spontaneous dissection/ruptured plaque in OM3 - PCI: 3 overlapping Promus DES to OM3, EF 55%   . Pneumonia    As a child    Past Surgical History:  Procedure Laterality Date  . APPENDECTOMY    . [place] bunionectomy right  February 2015  . CARDIAC CATHETERIZATION N/A 01/26/2016   Procedure: Left Heart Cath and Coronary Angiography;  Surgeon: Belva Crome, MD;  Location: [facility];  Service: Cardiovascular;  Laterality: N/A;  . CERVICAL  BIOPSY    . LEFT HEART CATHETERIZATION WITH CORONARY ANGIOGRAM N/A 06/24/2011   Procedure: LEFT HEART CATHETERIZATION WITH CORONARY ANGIOGRAM;  Surgeon: Jettie Booze, MD;  Location: [facility] CATH LAB;  Service: Cardiovascular;  Laterality: N/A;  . PERCUTANEOUS CORONARY STENT INTERVENTION (PCI-S) Right 06/24/2011   Procedure: PERCUTANEOUS CORONARY STENT INTERVENTION (PCI-S);  Surgeon: Jettie Booze, MD;  Location: [facility] CATH LAB;  Service: Cardiovascular;  Laterality: Right;  . TUBAL LIGATION    . WISDOM TOOTH EXTRACTION      MEDICATIONS: . Alirocumab (PRALUENT) 75 MG/ML SOAJ  .  ALPRAZolam (XANAX) 0.5 MG tablet  . aspirin EC 81 MG tablet  . Aspirin-Salicylamide-Caffeine (BC HEADACHE POWDER PO)  . Cyanocobalamin (VITAMIN B-12 PO)  . cyclobenzaprine (FLEXERIL) 10 MG tablet  . fexofenadine (ALLEGRA) 180 MG tablet  . FLUoxetine (PROZAC) 40 MG capsule  . ibuprofen (ADVIL,MOTRIN) 200 MG tablet  . lidocaine (XYLOCAINE) 2 % solution  . lisdexamfetamine (VYVANSE) 30 MG capsule  . nabumetone (RELAFEN) 750 MG tablet  . nitroGLYCERIN (NITROSTAT) 0.4 MG SL tablet  . pantoprazole (PROTONIX) 40 MG tablet  . pregabalin (LYRICA) 75 MG capsule  . traMADol (ULTRAM) 50 MG tablet  . Vitamin D3 (VITAMIN D) 25 MCG tablet  . zolpidem (AMBIEN) 10 MG tablet   No current facility-administered medications for this encounter.   Derrill Memo ON 10/01/2019] bupivacaine liposome (EXPAREL) 1.3 % injection 266 mg     Konrad Felix, PA-C [facility] [phone]

## 2019-10-01 ENCOUNTER — Observation Stay (HOSPITAL_COMMUNITY): Payer: 59

## 2019-10-01 ENCOUNTER — Encounter (HOSPITAL_COMMUNITY)
Admission: RE | Disposition: A | Discharge status: Home / Self Care | Payer: Self-pay | Source: Ambulatory Visit | Attending: Orthopaedic Surgery

## 2019-10-01 ENCOUNTER — Observation Stay (HOSPITAL_COMMUNITY)
Admission: RE | Admit: 2019-10-01 | Discharge: 2019-10-02 | Disposition: A | Discharge status: Home / Self Care | Payer: 59 | Source: Ambulatory Visit | Attending: Orthopaedic Surgery | Admitting: Orthopaedic Surgery

## 2019-10-01 ENCOUNTER — Ambulatory Visit (HOSPITAL_COMMUNITY): Payer: 59 | Admitting: Anesthesiology

## 2019-10-01 ENCOUNTER — Telehealth: Payer: Self-pay

## 2019-10-01 ENCOUNTER — Ambulatory Visit (HOSPITAL_COMMUNITY): Payer: 59 | Admitting: Physician Assistant

## 2019-10-01 ENCOUNTER — Encounter (HOSPITAL_COMMUNITY): Payer: Self-pay | Admitting: Orthopaedic Surgery

## 2019-10-01 ENCOUNTER — Ambulatory Visit: Payer: 59 | Admitting: Family Medicine

## 2019-10-01 ENCOUNTER — Other Ambulatory Visit: Payer: Self-pay

## 2019-10-01 DIAGNOSIS — I252 Old myocardial infarction: Secondary | ICD-10-CM | POA: Diagnosis not present

## 2019-10-01 DIAGNOSIS — Z79899 Other long term (current) drug therapy: Secondary | ICD-10-CM | POA: Diagnosis not present

## 2019-10-01 DIAGNOSIS — Z955 Presence of coronary angioplasty implant and graft: Secondary | ICD-10-CM | POA: Diagnosis not present

## 2019-10-01 DIAGNOSIS — I2510 Atherosclerotic heart disease of native coronary artery without angina pectoris: Secondary | ICD-10-CM | POA: Diagnosis not present

## 2019-10-01 DIAGNOSIS — M1712 Unilateral primary osteoarthritis, left knee: Principal | ICD-10-CM | POA: Diagnosis present

## 2019-10-01 DIAGNOSIS — Z8541 Personal history of malignant neoplasm of cervix uteri: Secondary | ICD-10-CM | POA: Insufficient documentation

## 2019-10-01 DIAGNOSIS — Z7982 Long term (current) use of aspirin: Secondary | ICD-10-CM | POA: Insufficient documentation

## 2019-10-01 DIAGNOSIS — Z87891 Personal history of nicotine dependence: Secondary | ICD-10-CM | POA: Insufficient documentation

## 2019-10-01 DIAGNOSIS — Z96652 Presence of left artificial knee joint: Secondary | ICD-10-CM

## 2019-10-01 HISTORY — PX: TOTAL KNEE ARTHROPLASTY: SHX125

## 2019-10-01 LAB — PREALBUMIN: Prealbumin: 23.3 mg/dL (ref 18–38)

## 2019-10-01 LAB — TYPE AND SCREEN
ABO/RH(D): A POS
Antibody Screen: NEGATIVE

## 2019-10-01 LAB — ABO/RH: ABO/RH(D): A POS

## 2019-10-01 SURGERY — ARTHROPLASTY, KNEE, TOTAL
Anesthesia: Regional | Site: Knee | Laterality: Left

## 2019-10-01 MED ORDER — LORAZEPAM 2 MG/ML IJ SOLN
INTRAMUSCULAR | Status: AC
  Filled 2019-10-01: qty 1

## 2019-10-01 MED ORDER — LIDOCAINE 2% (20 MG/ML) 5 ML SYRINGE
INTRAMUSCULAR | Status: DC | PRN
  Administered 2019-10-01: 100 mg via INTRAVENOUS

## 2019-10-01 MED ORDER — CEFAZOLIN SODIUM-DEXTROSE 2-4 GM/100ML-% IV SOLN: 2.0000 g | Freq: Four times a day (QID) | INTRAVENOUS | Status: DC

## 2019-10-01 MED ORDER — PHENYLEPHRINE HCL-NACL 10-0.9 MG/250ML-% IV SOLN
INTRAVENOUS | Status: DC | PRN
  Administered 2019-10-01: 25 ug/min via INTRAVENOUS
  Administered 2019-10-01: 40 ug/min via INTRAVENOUS

## 2019-10-01 MED ORDER — HYDROMORPHONE HCL 1 MG/ML IJ SOLN: 0.5000 mg | INTRAMUSCULAR | Status: DC | PRN

## 2019-10-01 MED ORDER — SODIUM CHLORIDE 0.9% FLUSH
INTRAVENOUS | Status: DC | PRN
  Administered 2019-10-01: 20 mL

## 2019-10-01 MED ORDER — DEXMEDETOMIDINE (PRECEDEX) IN NS 20 MCG/5ML (4 MCG/ML) IV SYRINGE
PREFILLED_SYRINGE | INTRAVENOUS | Status: DC | PRN
  Administered 2019-10-01 (×2): 8 ug via INTRAVENOUS

## 2019-10-01 MED ORDER — FENTANYL CITRATE (PF) 100 MCG/2ML IJ SOLN
INTRAMUSCULAR | Status: AC
Start: 2019-10-01 — End: 2019-10-01
  Filled 2019-10-01: qty 2

## 2019-10-01 MED ORDER — ZOLPIDEM TARTRATE 5 MG PO TABS
5.0000 mg | ORAL_TABLET | Freq: Every day | ORAL | Status: DC
  Administered 2019-10-01: 5 mg via ORAL
  Filled 2019-10-01: qty 1

## 2019-10-01 MED ORDER — ASPIRIN 81 MG PO CHEW
81.0000 mg | CHEWABLE_TABLET | Freq: Two times a day (BID) | ORAL | Status: DC
  Administered 2019-10-01: 81 mg via ORAL
  Filled 2019-10-01: qty 1

## 2019-10-01 MED ORDER — ACETAMINOPHEN 325 MG PO TABS: 325.0000 mg | ORAL_TABLET | Freq: Four times a day (QID) | ORAL | Status: DC | PRN

## 2019-10-01 MED ORDER — OXYCODONE HCL 5 MG PO TABS
10.0000 mg | ORAL_TABLET | ORAL | Status: DC | PRN
  Administered 2019-10-02: 15 mg via ORAL
  Filled 2019-10-01: qty 3

## 2019-10-01 MED ORDER — MENTHOL 3 MG MT LOZG: 1.0000 | LOZENGE | OROMUCOSAL | Status: DC | PRN

## 2019-10-01 MED ORDER — PANTOPRAZOLE SODIUM 40 MG PO TBEC: 40.0000 mg | DELAYED_RELEASE_TABLET | Freq: Every day | ORAL | Status: DC

## 2019-10-01 MED ORDER — TRANEXAMIC ACID 1000 MG/10ML IV SOLN
2000.0000 mg | INTRAVENOUS | Status: DC
  Filled 2019-10-01: qty 20

## 2019-10-01 MED ORDER — KETOROLAC TROMETHAMINE 15 MG/ML IJ SOLN: 30.0000 mg | Freq: Four times a day (QID) | INTRAMUSCULAR | Status: DC

## 2019-10-01 MED ORDER — ONDANSETRON HCL 4 MG PO TABS: 4.0000 mg | ORAL_TABLET | Freq: Three times a day (TID) | ORAL | 0 refills | Status: AC | PRN

## 2019-10-01 MED ORDER — TRANEXAMIC ACID-NACL 1000-0.7 MG/100ML-% IV SOLN
1000.0000 mg | INTRAVENOUS | Status: DC
  Filled 2019-10-01 (×2): qty 100

## 2019-10-01 MED ORDER — ACETAMINOPHEN 500 MG PO TABS
1000.0000 mg | ORAL_TABLET | Freq: Four times a day (QID) | ORAL | Status: DC
  Administered 2019-10-01 – 2019-10-02 (×3): 1000 mg via ORAL
  Filled 2019-10-01 (×3): qty 2

## 2019-10-01 MED ORDER — FENTANYL CITRATE (PF) 250 MCG/5ML IJ SOLN
INTRAMUSCULAR | Status: DC | PRN
  Administered 2019-10-01 (×4): 50 ug via INTRAVENOUS

## 2019-10-01 MED ORDER — METOCLOPRAMIDE HCL 5 MG/ML IJ SOLN: 5.0000 mg | Freq: Three times a day (TID) | INTRAMUSCULAR | Status: DC | PRN

## 2019-10-01 MED ORDER — SODIUM CHLORIDE 0.9 % IV SOLN: INTRAVENOUS | Status: DC

## 2019-10-01 MED ORDER — PROPOFOL 10 MG/ML IV BOLUS
INTRAVENOUS | Status: AC
  Filled 2019-10-01: qty 20

## 2019-10-01 MED ORDER — MIDAZOLAM HCL 2 MG/2ML IJ SOLN
INTRAMUSCULAR | Status: AC
  Filled 2019-10-01: qty 2

## 2019-10-01 MED ORDER — HYDROMORPHONE HCL 1 MG/ML IJ SOLN
INTRAMUSCULAR | Status: AC
Start: 2019-10-01 — End: 2019-10-02
  Filled 2019-10-01: qty 2

## 2019-10-01 MED ORDER — ONDANSETRON HCL 4 MG PO TABS
4.0000 mg | ORAL_TABLET | Freq: Four times a day (QID) | ORAL | Status: DC | PRN
  Administered 2019-10-02: 4 mg via ORAL
  Filled 2019-10-01: qty 1

## 2019-10-01 MED ORDER — ALUM & MAG HYDROXIDE-SIMETH 200-200-20 MG/5ML PO SUSP: 30.0000 mL | ORAL | Status: DC | PRN

## 2019-10-01 MED ORDER — ONDANSETRON HCL 4 MG/2ML IJ SOLN
INTRAMUSCULAR | Status: DC | PRN
  Administered 2019-10-01: 4 mg via INTRAVENOUS

## 2019-10-01 MED ORDER — OXYCODONE HCL 5 MG PO TABS
5.0000 mg | ORAL_TABLET | ORAL | Status: DC | PRN
  Administered 2019-10-01 – 2019-10-02 (×2): 10 mg via ORAL
  Filled 2019-10-01 (×2): qty 2

## 2019-10-01 MED ORDER — FENTANYL CITRATE (PF) 250 MCG/5ML IJ SOLN
INTRAMUSCULAR | Status: AC
  Filled 2019-10-01: qty 5

## 2019-10-01 MED ORDER — METOCLOPRAMIDE HCL 5 MG PO TABS: 5.0000 mg | ORAL_TABLET | Freq: Three times a day (TID) | ORAL | Status: DC | PRN

## 2019-10-01 MED ORDER — OXYCODONE HCL 5 MG PO TABS: 5.0000 mg | ORAL_TABLET | Freq: Once | ORAL | Status: DC | PRN

## 2019-10-01 MED ORDER — FLUOXETINE HCL 20 MG PO CAPS: 40.0000 mg | ORAL_CAPSULE | Freq: Every day | ORAL | Status: DC

## 2019-10-01 MED ORDER — OXYCODONE HCL ER 10 MG PO T12A
10.0000 mg | EXTENDED_RELEASE_TABLET | Freq: Two times a day (BID) | ORAL | Status: DC
  Administered 2019-10-01: 10 mg via ORAL
  Filled 2019-10-01: qty 1

## 2019-10-01 MED ORDER — MAGNESIUM CITRATE PO SOLN: 1.0000 | Freq: Once | ORAL | Status: DC | PRN

## 2019-10-01 MED ORDER — SODIUM CHLORIDE 0.9 % IR SOLN
Status: DC | PRN
  Administered 2019-10-01: 3000 mL

## 2019-10-01 MED ORDER — ASPIRIN EC 81 MG PO TBEC: 81.0000 mg | DELAYED_RELEASE_TABLET | Freq: Two times a day (BID) | ORAL | 0 refills | Status: DC

## 2019-10-01 MED ORDER — BUPIVACAINE-EPINEPHRINE (PF) 0.5% -1:200000 IJ SOLN
INTRAMUSCULAR | Status: DC | PRN
  Administered 2019-10-01: 15 mL via PERINEURAL

## 2019-10-01 MED ORDER — EPHEDRINE 5 MG/ML INJ
INTRAVENOUS | Status: AC
  Filled 2019-10-01: qty 10

## 2019-10-01 MED ORDER — GABAPENTIN 300 MG PO CAPS: 300.0000 mg | ORAL_CAPSULE | Freq: Three times a day (TID) | ORAL | Status: DC

## 2019-10-01 MED ORDER — DOCUSATE SODIUM 100 MG PO CAPS: 100.0000 mg | ORAL_CAPSULE | Freq: Every day | ORAL | 2 refills | Status: DC | PRN

## 2019-10-01 MED ORDER — CHLORHEXIDINE GLUCONATE 0.12 % MT SOLN
15.0000 mL | Freq: Once | OROMUCOSAL | Status: AC
  Administered 2019-10-01: 15 mL via OROMUCOSAL

## 2019-10-01 MED ORDER — ORAL CARE MOUTH RINSE: 15.0000 mL | Freq: Once | OROMUCOSAL | Status: AC

## 2019-10-01 MED ORDER — PROPOFOL 10 MG/ML IV BOLUS
INTRAVENOUS | Status: DC | PRN
  Administered 2019-10-01: 150 mg via INTRAVENOUS

## 2019-10-01 MED ORDER — FENTANYL CITRATE (PF) 100 MCG/2ML IJ SOLN
INTRAMUSCULAR | Status: AC
Start: 2019-10-01 — End: 2019-10-02
  Filled 2019-10-01: qty 4

## 2019-10-01 MED ORDER — POLYETHYLENE GLYCOL 3350 17 G PO PACK: 17.0000 g | PACK | Freq: Every day | ORAL | Status: DC | PRN

## 2019-10-01 MED ORDER — MIDAZOLAM HCL 2 MG/2ML IJ SOLN
1.0000 mg | INTRAMUSCULAR | Status: DC
  Administered 2019-10-01: 1 mg via INTRAVENOUS

## 2019-10-01 MED ORDER — SODIUM CHLORIDE (PF) 0.9 % IJ SOLN
INTRAMUSCULAR | Status: AC
  Filled 2019-10-01: qty 20

## 2019-10-01 MED ORDER — ACETAMINOPHEN 500 MG PO TABS
1000.0000 mg | ORAL_TABLET | Freq: Once | ORAL | Status: AC
  Administered 2019-10-01: 1000 mg via ORAL
  Filled 2019-10-01: qty 2

## 2019-10-01 MED ORDER — PREGABALIN 75 MG PO CAPS
150.0000 mg | ORAL_CAPSULE | Freq: Two times a day (BID) | ORAL | Status: DC
  Filled 2019-10-01: qty 2

## 2019-10-01 MED ORDER — OXYCODONE HCL ER 10 MG PO T12A: 10.0000 mg | EXTENDED_RELEASE_TABLET | Freq: Two times a day (BID) | ORAL | 0 refills | Status: DC

## 2019-10-01 MED ORDER — OXYCODONE HCL 5 MG/5ML PO SOLN: 5.0000 mg | Freq: Once | ORAL | Status: DC | PRN

## 2019-10-01 MED ORDER — LORAZEPAM 2 MG/ML IJ SOLN
1.0000 mg | Freq: Once | INTRAMUSCULAR | Status: AC
  Administered 2019-10-01: 1 mg via INTRAVENOUS

## 2019-10-01 MED ORDER — EPHEDRINE SULFATE-NACL 50-0.9 MG/10ML-% IV SOSY
PREFILLED_SYRINGE | INTRAVENOUS | Status: DC | PRN
  Administered 2019-10-01: 10 mg via INTRAVENOUS
  Administered 2019-10-01 (×3): 5 mg via INTRAVENOUS

## 2019-10-01 MED ORDER — POVIDONE-IODINE 10 % EX SWAB
2.0000 "application " | Freq: Once | CUTANEOUS | Status: AC
  Administered 2019-10-01: 2 via TOPICAL

## 2019-10-01 MED ORDER — OXYCODONE-ACETAMINOPHEN 5-325 MG PO TABS: 1.0000 | ORAL_TABLET | Freq: Four times a day (QID) | ORAL | 0 refills | Status: DC | PRN

## 2019-10-01 MED ORDER — DIPHENHYDRAMINE HCL 12.5 MG/5ML PO ELIX: 25.0000 mg | ORAL_SOLUTION | ORAL | Status: DC | PRN

## 2019-10-01 MED ORDER — BUPIVACAINE HCL 0.25 % IJ SOLN
INTRAMUSCULAR | Status: DC | PRN
  Administered 2019-10-01: 20 mL

## 2019-10-01 MED ORDER — PHENOL 1.4 % MT LIQD: 1.0000 | OROMUCOSAL | Status: DC | PRN

## 2019-10-01 MED ORDER — DEXMEDETOMIDINE (PRECEDEX) IN NS 20 MCG/5ML (4 MCG/ML) IV SYRINGE
PREFILLED_SYRINGE | INTRAVENOUS | Status: AC
  Filled 2019-10-01: qty 5

## 2019-10-01 MED ORDER — DEXAMETHASONE SODIUM PHOSPHATE 10 MG/ML IJ SOLN
INTRAMUSCULAR | Status: DC | PRN
  Administered 2019-10-01: 10 mg via INTRAVENOUS

## 2019-10-01 MED ORDER — CLONIDINE HCL (ANALGESIA) 100 MCG/ML EP SOLN
EPIDURAL | Status: DC | PRN
  Administered 2019-10-01: 100 ug

## 2019-10-01 MED ORDER — LACTATED RINGERS IV SOLN: INTRAVENOUS | Status: DC

## 2019-10-01 MED ORDER — ONDANSETRON HCL 4 MG/2ML IJ SOLN: 4.0000 mg | Freq: Four times a day (QID) | INTRAMUSCULAR | Status: DC | PRN

## 2019-10-01 MED ORDER — BUPIVACAINE LIPOSOME 1.3 % IJ SUSP
INTRAMUSCULAR | Status: DC | PRN
  Administered 2019-10-01: 20 mL

## 2019-10-01 MED ORDER — 0.9 % SODIUM CHLORIDE (POUR BTL) OPTIME
TOPICAL | Status: DC | PRN
  Administered 2019-10-01: 1000 mL

## 2019-10-01 MED ORDER — BUPIVACAINE-EPINEPHRINE (PF) 0.25% -1:200000 IJ SOLN
INTRAMUSCULAR | Status: AC
  Filled 2019-10-01: qty 30

## 2019-10-01 MED ORDER — FENTANYL CITRATE (PF) 100 MCG/2ML IJ SOLN
50.0000 ug | INTRAMUSCULAR | Status: DC
  Administered 2019-10-01: 50 ug via INTRAVENOUS

## 2019-10-01 MED ORDER — PROMETHAZINE HCL 25 MG/ML IJ SOLN: 6.2500 mg | INTRAMUSCULAR | Status: DC | PRN

## 2019-10-01 MED ORDER — HYDROMORPHONE HCL 1 MG/ML IJ SOLN
0.2500 mg | INTRAMUSCULAR | Status: DC | PRN
  Administered 2019-10-01 (×4): 0.5 mg via INTRAVENOUS

## 2019-10-01 MED ORDER — LISDEXAMFETAMINE DIMESYLATE 30 MG PO CAPS: 30.0000 mg | ORAL_CAPSULE | Freq: Every day | ORAL | Status: DC

## 2019-10-01 MED ORDER — VANCOMYCIN HCL IN DEXTROSE 1-5 GM/200ML-% IV SOLN
1000.0000 mg | INTRAVENOUS | Status: AC
  Administered 2019-10-01: 1000 mg via INTRAVENOUS
  Filled 2019-10-01: qty 200

## 2019-10-01 MED ORDER — VANCOMYCIN HCL 1 G IV SOLR
INTRAVENOUS | Status: DC | PRN
  Administered 2019-10-01: 1000 mg

## 2019-10-01 MED ORDER — FENTANYL CITRATE (PF) 100 MCG/2ML IJ SOLN
25.0000 ug | INTRAMUSCULAR | Status: DC | PRN
  Administered 2019-10-01 (×3): 50 ug via INTRAVENOUS

## 2019-10-01 MED ORDER — TRANEXAMIC ACID 1000 MG/10ML IV SOLN
INTRAVENOUS | Status: DC | PRN
  Administered 2019-10-01: 2000 mg via TOPICAL

## 2019-10-01 MED ORDER — DEXAMETHASONE SODIUM PHOSPHATE 10 MG/ML IJ SOLN
10.0000 mg | Freq: Once | INTRAMUSCULAR | Status: AC
  Administered 2019-10-02: 10 mg via INTRAVENOUS
  Filled 2019-10-01: qty 1

## 2019-10-01 MED ORDER — SORBITOL 70 % SOLN
30.0000 mL | Freq: Every day | Status: DC | PRN
  Filled 2019-10-01: qty 30

## 2019-10-01 MED ORDER — DOCUSATE SODIUM 100 MG PO CAPS
100.0000 mg | ORAL_CAPSULE | Freq: Two times a day (BID) | ORAL | Status: DC
  Administered 2019-10-01: 100 mg via ORAL
  Filled 2019-10-01: qty 1

## 2019-10-01 MED ORDER — CELECOXIB 200 MG PO CAPS: 200.0000 mg | ORAL_CAPSULE | Freq: Two times a day (BID) | ORAL | Status: DC

## 2019-10-01 MED ORDER — VANCOMYCIN HCL 1000 MG IV SOLR
INTRAVENOUS | Status: AC
  Filled 2019-10-01: qty 1000

## 2019-10-01 MED ORDER — VANCOMYCIN HCL IN DEXTROSE 1-5 GM/200ML-% IV SOLN
1000.0000 mg | Freq: Once | INTRAVENOUS | Status: AC
  Administered 2019-10-02: 1000 mg via INTRAVENOUS
  Filled 2019-10-01: qty 200

## 2019-10-01 MED ORDER — STERILE WATER FOR IRRIGATION IR SOLN
Status: DC | PRN
  Administered 2019-10-01: 2000 mL

## 2019-10-01 SURGICAL SUPPLY — 69 items
BAG SPEC THK2 15X12 ZIP CLS (MISCELLANEOUS) ×1
BAG ZIPLOCK 12X15 (MISCELLANEOUS) ×2 IMPLANT
BLADE HEX COATED 2.75 (ELECTRODE) ×3 IMPLANT
BLADE SAG 18X100X1.27 (BLADE) ×2 IMPLANT
BLADE SAW SGTL 13.0X1.19X90.0M (BLADE) ×2 IMPLANT
BNDG COHESIVE 4X5 TAN STRL (GAUZE/BANDAGES/DRESSINGS) ×1 IMPLANT
BOWL SMART MIX CTS (DISPOSABLE) ×2 IMPLANT
BSPLAT TIB E 2 PG STRL KN LT (Knees) ×1 IMPLANT
COMP FEM CR PERS SZ6 LT (Joint) ×2 IMPLANT
COMPONENT FEM CR PERS SZ6 LT (Joint) IMPLANT
COMPONENT TIB 2 PEG SZ E LT KN (Knees) IMPLANT
COVER SURGICAL LIGHT HANDLE (MISCELLANEOUS) ×2 IMPLANT
COVER WAND RF STERILE (DRAPES) IMPLANT
CUFF TOURN SGL QUICK 34 (TOURNIQUET CUFF) ×2
CUFF TRNQT CYL 34X4.125X (TOURNIQUET CUFF) ×1 IMPLANT
DECANTER SPIKE VIAL GLASS SM (MISCELLANEOUS) ×4 IMPLANT
DRAPE ORTHO SPLIT 77X108 STRL (DRAPES) ×4
DRAPE SHEET LG 3/4 BI-LAMINATE (DRAPES) ×2 IMPLANT
DRAPE SURG ORHT 6 SPLT 77X108 (DRAPES) ×2 IMPLANT
DRAPE U-SHAPE 47X51 STRL (DRAPES) ×4 IMPLANT
DRSG AQUACEL AG ADV 3.5X10 (GAUZE/BANDAGES/DRESSINGS) ×1 IMPLANT
DURAPREP 26ML APPLICATOR (WOUND CARE) ×6 IMPLANT
ELECT REM PT RETURN 15FT ADLT (MISCELLANEOUS) ×2 IMPLANT
EVACUATOR 1/8 PVC DRAIN (DRAIN) IMPLANT
GAUZE SPONGE 4X4 12PLY STRL (GAUZE/BANDAGES/DRESSINGS) IMPLANT
GLOVE BIOGEL PI IND STRL 7.0 (GLOVE) ×2 IMPLANT
GLOVE BIOGEL PI INDICATOR 7.0 (GLOVE) ×2
GLOVE ECLIPSE 7.0 STRL STRAW (GLOVE) ×6 IMPLANT
GLOVE SKINSENSE NS SZ7.5 (GLOVE) ×1
GLOVE SKINSENSE STRL SZ7.5 (GLOVE) ×1 IMPLANT
GLOVE SURG SS PI 7.0 STRL IVOR (GLOVE) ×2 IMPLANT
GLOVE SURG SYN 7.5  E (GLOVE) ×6
GLOVE SURG SYN 7.5 E (GLOVE) ×3 IMPLANT
GLOVE SURG SYN 7.5 PF PI (GLOVE) ×3 IMPLANT
GOWN STRL REIN XL XLG (GOWN DISPOSABLE) ×4 IMPLANT
HANDPIECE INTERPULSE COAX TIP (DISPOSABLE) ×2
HOLDER FOLEY CATH W/STRAP (MISCELLANEOUS) IMPLANT
HOOD PEEL AWAY FLYTE STAYCOOL (MISCELLANEOUS) ×6 IMPLANT
IMPL PATELLA METAL SZ32X10 (Joint) ×1 IMPLANT
JET LAVAGE IRRISEPT WOUND (IRRIGATION / IRRIGATOR) ×2
KIT TURNOVER KIT A (KITS) ×2 IMPLANT
LAVAGE JET IRRISEPT WOUND (IRRIGATION / IRRIGATOR) ×1 IMPLANT
MARKER SKIN DUAL TIP RULER LAB (MISCELLANEOUS) ×4 IMPLANT
NDL SAFETY ECLIPSE 18X1.5 (NEEDLE) IMPLANT
NDL SPNL 18GX3.5 QUINCKE PK (NEEDLE) ×1 IMPLANT
NEEDLE HYPO 18GX1.5 SHARP (NEEDLE)
NEEDLE SPNL 18GX3.5 QUINCKE PK (NEEDLE) ×2 IMPLANT
NS IRRIG 1000ML POUR BTL (IV SOLUTION) ×4 IMPLANT
PENCIL SMOKE EVACUATOR (MISCELLANEOUS) IMPLANT
PROTECTOR NERVE ULNAR (MISCELLANEOUS) ×2 IMPLANT
SAW OSC TIP CART 19.5X105X1.3 (SAW) ×2 IMPLANT
SET HNDPC FAN SPRY TIP SCT (DISPOSABLE) ×1 IMPLANT
SET PAD KNEE POSITIONER (MISCELLANEOUS) ×2 IMPLANT
STEM MED AS PERS SZ6-7 11 (Stem) ×1 IMPLANT
STRIP CLOSURE SKIN 1/2X4 (GAUZE/BANDAGES/DRESSINGS) ×4 IMPLANT
SUT MNCRL AB 3-0 PS2 18 (SUTURE) IMPLANT
SUT MNCRL AB 4-0 PS2 18 (SUTURE) ×1 IMPLANT
SUT VIC AB 0 CT1 36 (SUTURE) ×2 IMPLANT
SUT VIC AB 1 CTX 36 (SUTURE) ×4
SUT VIC AB 1 CTX36XBRD ANBCTR (SUTURE) ×1 IMPLANT
SUT VIC AB 2-0 CT1 27 (SUTURE) ×8
SUT VIC AB 2-0 CT1 TAPERPNT 27 (SUTURE) ×3 IMPLANT
SYR 50ML LL SCALE MARK (SYRINGE) ×2 IMPLANT
TAPE STRIPS DRAPE STRL (GAUZE/BANDAGES/DRESSINGS) ×1 IMPLANT
TIBIA POR 2 PEG SZ E L KNEE (Knees) ×2 IMPLANT
TRAY FOLEY MTR SLVR 16FR STAT (SET/KITS/TRAYS/PACK) ×2 IMPLANT
WATER STERILE IRR 1000ML POUR (IV SOLUTION) ×4 IMPLANT
WRAP KNEE MAXI GEL POST OP (GAUZE/BANDAGES/DRESSINGS) ×2 IMPLANT
YANKAUER SUCT BULB TIP 10FT TU (MISCELLANEOUS) ×2 IMPLANT

## 2019-10-01 NOTE — Plan of Care (Signed)
Reviewed use of call bell for needs/safety 

## 2019-10-01 NOTE — Plan of Care (Signed)
  Problem: Education: Goal: Knowledge of General Education information will improve Description: Including pain rating scale, medication(s)/side effects and non-pharmacologic comfort measures Outcome: Progressing   Problem: Activity: Goal: Risk for activity intolerance will decrease Outcome: Progressing   Problem: Nutrition: Goal: Adequate nutrition will be maintained Outcome: Progressing   Problem: Coping: Goal: Level of anxiety will decrease Outcome: Progressing   Problem: Elimination: Goal: Will not experience complications related to bowel motility Outcome: Progressing   Problem: Pain Managment: Goal: General experience of comfort will improve Outcome: Progressing   Problem: Education: Goal: Knowledge of the prescribed therapeutic regimen will improve Outcome: Progressing   Problem: Activity: Goal: Ability to avoid complications of mobility impairment will improve Outcome: Progressing   Problem: Pain Management: Goal: Pain level will decrease with appropriate interventions Outcome: Progressing

## 2019-10-01 NOTE — Progress Notes (Signed)
Orthopedic Tech Progress Note Patient Details:  Maria Bishop 05-20-1960 [id number]  CPM Left Knee CPM Left Knee: On Left Knee Flexion (Degrees): 90 Left Knee Extension (Degrees): 0  Post Interventions Patient Tolerated: Well Instructions Provided: Care of device  [name] 10/01/2019, 4:13 PM

## 2019-10-01 NOTE — Op Note (Signed)
Total Knee Arthroplasty Procedure Note  Preoperative diagnosis: Left knee osteoarthritis  Postoperative diagnosis:same  Operative procedure: Left total knee arthroplasty. CPT [phone]  Surgeon: N. Eduard Roux, MD  Assist: Madalyn Rob, PA-C; necessary for the timely completion of procedure and due to complexity of procedure.  Anesthesia: Spinal, regional  Tourniquet time: 45 minutes  Implants used: Zimmer persona Femur: CR 6 pressfit Tibia: E pressfit Patella: 32 mm pressfit Polyethylene: 11 mm, MC  Indication: Maria Bishop is a 59 y.o. year old female with a history of knee pain. Having failed conservative management, the patient elected to proceed with a total knee arthroplasty.  We have reviewed the risk and benefits of the surgery and they elected to proceed after voicing understanding.  Procedure:  After informed consent was obtained and understanding of the risk were voiced including but not limited to bleeding, infection, damage to surrounding structures including nerves and vessels, blood clots, leg length inequality and the failure to achieve desired results, the operative extremity was marked with verbal confirmation of the patient in the holding area.   The patient was then brought to the operating room and transported to the operating room table in the supine position.  A tourniquet was applied to the operative extremity around the upper thigh. The operative limb was then prepped and draped in the usual sterile fashion and preoperative antibiotics were administered.  A time out was performed prior to the start of surgery confirming the correct extremity, preoperative antibiotic administration, as well as team members, implants and instruments available for the case. Correct surgical site was also confirmed with preoperative radiographs. The limb was then elevated for exsanguination and the tourniquet was inflated. A midline incision was made and a standard  medial parapatellar approach was performed.  The patella was prepared and sized to a 32 mm.  A cover was placed on the patella for protection from retractors.  We then turned our attention to the femur. Posterior cruciate ligament was sacrificed. Start site was drilled in the femur and the intramedullary distal femoral cutting guide was placed, set at 5 degrees valgus, taking 10 mm of distal resection. The distal cut was made. Osteophytes were then removed.  Extension gap was then checked. Next, the proximal tibial cutting guide was placed with appropriate slope, varus/valgus alignment and depth of resection. The proximal tibial cut was made. Gap blocks were then used to assess the extension gap and alignment, and appropriate soft tissue releases were performed. Attention was turned back to the femur, which was sized using the sizing guide to a size 6. Appropriate rotation of the femoral component was determined using epicondylar axis, Whiteside's line, and assessing the flexion gap under ligament tension. The appropriate size 4-in-1 cutting block was placed and cuts were made.  Posterior femoral osteophytes and uncapped bone were then removed with the curved osteotome.  Trial components were placed, and stability was checked in full extension, mid-flexion, and deep flexion. Proper tibial rotation was determined and marked.  The patella tracked well without a lateral release. Trial components were then removed and tibial preparation performed.  The tibia was sized for a size E component.  A posterior capsular injection comprising of 20 cc of 1.3% exparel, 20 cc of 0.25% bupivicaine with epi and 20 cc of normal saline was performed for postoperative pain control. The bony surfaces were irrigated with a pulse lavage and then dried.  The stability of the construct was re-evaluated throughout a range of motion and found to  be acceptable. The trial liner was removed, the knee was copiously irrigated, and the knee was  re-evaluated for any excess bone debris. The real polyethylene liner, 11 mm thick, was inserted and checked to ensure the locking mechanism had engaged appropriately. The tourniquet was deflated and hemostasis was achieved. The wound was irrigated with normal saline.  One gram of vancomycin powder was placed in the surgical bed.  Capsular closure was performed with a #1 vicryl, subcutaneous fat closed with a 0 vicryl suture, then subcutaneous tissue closed with interrupted 2.0 vicryl suture. The skin was then closed with a 3.0 monocryl and steri strips. A sterile dressing was applied.  The patient was awakened in the operating room and taken to recovery in stable condition. All sponge, needle, and instrument counts were correct at the end of the case.  Position: supine  Complications: none.  Time Out: performed   Drains/Packing: none  Estimated blood loss: minimal  Returned to Recovery Room: in good condition.   Antibiotics: yes   Mechanical VTE (DVT) Prophylaxis: sequential compression devices, TED thigh-high  Chemical VTE (DVT) Prophylaxis: aspirin  Fluid Replacement  Crystalloid: see anesthesia record Blood: none  FFP: none   Specimens Removed: 1 to pathology   Sponge and Instrument Count Correct? yes   PACU: portable radiograph - knee AP and Lateral   Plan/RTC: Return in 2 weeks for wound check.   Weight Bearing/Load Lower Extremity: full   N. Eduard Roux, MD [facility] 2:49 PM

## 2019-10-01 NOTE — Anesthesia Postprocedure Evaluation (Signed)
Anesthesia Post Note  Patient: Maria Bishop  Procedure(s) Performed: LEFT TOTAL KNEE ARTHROPLASTY (Left Knee)     Patient location during evaluation: PACU Anesthesia Type: General and Regional Level of consciousness: awake and alert Pain management: pain level controlled Vital Signs Assessment: post-procedure vital signs reviewed and stable Respiratory status: spontaneous breathing, nonlabored ventilation, respiratory function stable and patient connected to nasal cannula oxygen Cardiovascular status: blood pressure returned to baseline and stable Postop Assessment: no apparent nausea or vomiting Anesthetic complications: no   No complications documented.  Last Vitals:  Vitals:   10/01/19 1733 10/01/19 1826  BP: 106/76 98/72  Pulse: 92 91  Resp: 18 17  Temp: 36.8 C   SpO2: 100% 97%    Last Pain:  Vitals:   10/01/19 1733  TempSrc: Oral  PainSc: 4                  Maria Bishop

## 2019-10-01 NOTE — Anesthesia Procedure Notes (Signed)
Anesthesia Regional Block: Adductor canal block   Pre-Anesthetic Checklist: ,, timeout performed, Correct Patient, Correct Site, Correct Laterality, Correct Procedure, Correct Position, site marked, Risks and benefits discussed, pre-op evaluation,  At surgeon's request and post-op pain management  Laterality: Left  Prep: Maximum Sterile Barrier Precautions used, chloraprep       Needles:  Injection technique: Single-shot  Needle Type: Echogenic Stimulator Needle     Needle Length: 9cm  Needle Gauge: 22     Additional Needles:   Procedures:,,,, ultrasound used (permanent image in chart),,,,  Narrative:  Start time: 10/01/2019 12:09 PM End time: 10/01/2019 12:11 PM Injection made incrementally with aspirations every 5 mL.  Performed by: Personally  Anesthesiologist: Brennan Bailey, MD  Additional Notes: Risks, benefits, and alternative discussed. Patient gave consent for procedure. Patient prepped and draped in sterile fashion. Sedation administered, patient remains easily responsive to voice. Relevant anatomy identified with ultrasound guidance. Local anesthetic given in 5cc increments with no signs or symptoms of intravascular injection. No pain or paraesthesias with injection. Patient monitored throughout procedure with signs of LAST or immediate complications. Tolerated well. Ultrasound image placed in chart.  Tawny Asal, MD

## 2019-10-01 NOTE — Anesthesia Procedure Notes (Signed)
Procedure Name: LMA Insertion Date/Time: 10/01/2019 1:37 PM Performed by: Lavina Hamman, CRNA Pre-anesthesia Checklist: Patient identified, Emergency Drugs available, Suction available and Patient being monitored Patient Re-evaluated:Patient Re-evaluated prior to induction Oxygen Delivery Method: Circle System Utilized Preoxygenation: Pre-oxygenation with 100% oxygen Induction Type: IV induction Ventilation: Mask ventilation without difficulty LMA: LMA inserted LMA Size: 4.0 Number of attempts: 1 Airway Equipment and Method: Bite block Placement Confirmation: positive ETCO2 Tube secured with: Tape Dental Injury: Teeth and Oropharynx as per pre-operative assessment

## 2019-10-01 NOTE — Transfer of Care (Signed)
Immediate Anesthesia Transfer of Care Note  Patient: Maria Bishop  Procedure(s) Performed: LEFT TOTAL KNEE ARTHROPLASTY (Left Knee)  Patient Location: PACU  Anesthesia Type:General  Level of Consciousness: sedated  Airway & Oxygen Therapy: Patient Spontanous Breathing and Patient connected to face mask oxygen  Post-op Assessment: Report given to RN and Post -op Vital signs reviewed and stable  Post vital signs: Reviewed and stable  Last Vitals:  Vitals Value Taken Time  BP    Temp    Pulse    Resp 9 10/01/19 1534  SpO2    Vitals shown include unvalidated device data.  Last Pain:  Vitals:   10/01/19 1103  TempSrc:   PainSc: 5       Patients Stated Pain Goal: 4 (94/85/46 2703)  Complications: No complications documented.

## 2019-10-01 NOTE — Telephone Encounter (Signed)
Pharmacy called  Requesting a call back to establish which of the two rx sent in is the one to be filled  Call back: [phone]

## 2019-10-01 NOTE — Progress Notes (Signed)
Assisted Dr. Doloris Hall with Left Knee Adductor canal  block. Side rails up, monitors on throughout procedure. See vital signs in flow sheet. Tolerated Procedure well.

## 2019-10-01 NOTE — Discharge Instructions (Signed)

## 2019-10-01 NOTE — H&P (Signed)
PREOPERATIVE H&P  Chief Complaint: left knee degenerative joint disease  HPI: Maria Bishop is a 59 y.o. female who presents for surgical treatment of left knee degenerative joint disease.  She denies any changes in medical history.  Past Medical History:  Diagnosis Date  . Anxiety   . CAD (coronary artery disease)    a. NSTEMI 2013: likely spontaneous dissection/ruptured plaque in OM3 - PCI: 3 overlapping Promus DES to OM3, EF 55% b. cath 2014: patent stents  . Cervical cancer (HCC)    Cervical cancer  . Degenerative disc disease   . Depression   . GERD (gastroesophageal reflux disease)   . Headache   . Heart murmur   . MRSA infection   . Myocardial infarction ([place])    ~44yrs ago  . NSTEMI (non-ST elevated myocardial infarction) ([place]) 06/24/2011   a. NSTEMI 2013: likely spontaneous dissection/ruptured plaque in OM3 - PCI: 3 overlapping Promus DES to OM3, EF 55%   . Pneumonia    As a child   Past Surgical History:  Procedure Laterality Date  . APPENDECTOMY    . [place] bunionectomy right  February 2015  . CARDIAC CATHETERIZATION N/A 01/26/2016   Procedure: Left Heart Cath and Coronary Angiography;  Surgeon: Belva Crome, MD;  Location: [facility];  Service: Cardiovascular;  Laterality: N/A;  . CERVICAL BIOPSY    . LEFT HEART CATHETERIZATION WITH CORONARY ANGIOGRAM N/A 06/24/2011   Procedure: LEFT HEART CATHETERIZATION WITH CORONARY ANGIOGRAM;  Surgeon: Jettie Booze, MD;  Location: [facility] CATH LAB;  Service: Cardiovascular;  Laterality: N/A;  . PERCUTANEOUS CORONARY STENT INTERVENTION (PCI-S) Right 06/24/2011   Procedure: PERCUTANEOUS CORONARY STENT INTERVENTION (PCI-S);  Surgeon: Jettie Booze, MD;  Location: [facility] CATH LAB;  Service: Cardiovascular;  Laterality: Right;  . TUBAL LIGATION    . WISDOM TOOTH EXTRACTION     Social History   Socioeconomic History  . Marital status: Single    Spouse name: Not on file  . Number of children: 2  . Years of  education: Not on file  . Highest education level: Not on file  Occupational History  . Occupation: Customer service manager  Tobacco Use  . Smoking status: Former Smoker    Types: Cigarettes  . Smokeless tobacco: Never Used  . Tobacco comment: quit over 30 years ago   Vaping Use  . Vaping Use: Never used  Substance and Sexual Activity  . Alcohol use: Yes    Alcohol/week: 0.0 standard drinks    Comment: Occasional  . Drug use: No  . Sexual activity: Yes    Birth control/protection: Condom, Surgical    Comment: Tubal ligation  Other Topics Concern  . Not on file  Social History Narrative   Right handed    Social Determinants of Health   Financial Resource Strain:   . Difficulty of Paying Living Expenses: Not on file  Food Insecurity:   . Worried About Charity fundraiser in the Last Year: Not on file  . Ran Out of Food in the Last Year: Not on file  Transportation Needs:   . Lack of Transportation (Medical): Not on file  . Lack of Transportation (Non-Medical): Not on file  Physical Activity:   . Days of Exercise per Week: Not on file  . Minutes of Exercise per Session: Not on file  Stress:   . Feeling of Stress : Not on file  Social Connections:   . Frequency of Communication with Friends and Family: Not on  file  . Frequency of Social Gatherings with Friends and Family: Not on file  . Attends Religious Services: Not on file  . Active Member of Clubs or Organizations: Not on file  . Attends Archivist Meetings: Not on file  . Marital Status: Not on file   Family History  Problem Relation Age of Onset  . Alzheimer's disease Maternal Uncle   . Coronary artery disease Mother        MI in her 20s  . Early death Father   . Brain cancer Sister   . Drug abuse Son   . Asthma Daughter   . Anxiety disorder Daughter    Allergies  Allergen Reactions  . Cefdinir Swelling    Gums, tongue -- mild  . Statins     Failed Crestor 5 mg qd, Simvastatin 40 mg, and Lipitor  40 mg, and pravastatin 10 mg qd due to muscle and joint pain  . Zetia [Ezetimibe]     Depression and constipation per patient  . Mobic [Meloxicam] Rash  . Shellfish Allergy Rash    Childhood reaction   Prior to Admission medications   Medication Sig Start Date End Date Taking? Authorizing Provider  Alirocumab (PRALUENT) 75 MG/ML SOAJ Inject 1 pen into the skin every 14 (fourteen) days. Patient taking differently: Inject 75 mg into the skin every 14 (fourteen) days.  08/08/19  Yes Sherren Mocha, MD  ALPRAZolam Duanne Moron) 0.5 MG tablet TAKE 1 TABLET BY MOUTH 2 TIMES DAILY AS NEEDED Patient taking differently: Take 0.5 mg by mouth 2 (two) times daily as needed (anxiety.). TAKE 1 TABLET BY MOUTH 2 TIMES DAILY AS NEEDE 07/25/19  Yes [name], MD  Cyanocobalamin (VITAMIN B-12 PO) Take 1 tablet by mouth daily.   Yes [provider]  cyclobenzaprine (FLEXERIL) 10 MG tablet TAKE 1 TABLET BY MOUTH 3 TIMES DAILY AS NEEDED FOR MUSCLE SPASMS Patient taking differently: Take 10 mg by mouth 3 (three) times daily as needed (spasms.).  09/22/19  Yes [name], MD  FLUoxetine (PROZAC) 40 MG capsule TAKE 1 CAPSULE BY MOUTH EVERY DAY Patient taking differently: Take 40 mg by mouth daily.  09/22/19  Yes [name], MD  lisdexamfetamine (VYVANSE) 30 MG capsule Take 1 capsule (30 mg total) by mouth daily. 08/20/19  Yes [name], MD  nitroGLYCERIN (NITROSTAT) 0.4 MG SL tablet Place 1 tablet (0.4 mg total) under the tongue every 5 (five) minutes as needed. 11/26/13  Yes Sherren Mocha, MD  pantoprazole (PROTONIX) 40 MG tablet TAKE 1 TABLET BY MOUTH EVERY DAY Patient taking differently: Take 40 mg by mouth daily before breakfast.  09/22/19  Yes [name], MD  pregabalin (LYRICA) 75 MG capsule 1 PO BID, may increase to 2 PO BID if needed. Patient taking differently: Take 150 mg by mouth in the morning and at bedtime.  03/19/19  Yes Hilts, Legrand Como, MD  traMADol  (ULTRAM) 50 MG tablet TAKE 1 TABLET BY MOUTH EVERY 8 HOURS AS NEEDED Patient taking differently: Take 50 mg by mouth every 8 (eight) hours as needed (pain.).  09/22/19  Yes Hilts, Legrand Como, MD  Vitamin D3 (VITAMIN D) 25 MCG tablet Take 1,000 Units by mouth daily.   Yes [provider]  zolpidem (AMBIEN) 10 MG tablet TAKE 1 TABLET BY MOUTH NIGHTLY AT BEDTIME AS NEEDED FOR SLEEP Patient taking differently: Take 10 mg by mouth at bedtime.  07/09/19  Yes [name], MD  aspirin EC 81 MG tablet  Take 81 mg by mouth daily. Swallow whole.    [provider]  Aspirin-Salicylamide-Caffeine (BC HEADACHE POWDER PO) Take 1 packet by mouth daily as needed (pain).     [provider]  fexofenadine (ALLEGRA) 180 MG tablet Take 180 mg by mouth 3 (three) times a week. Reported on 06/02/2015    [provider]  ibuprofen (ADVIL,MOTRIN) 200 MG tablet Take 800 mg by mouth every 6 (six) hours as needed. For pain    [provider]  lidocaine (XYLOCAINE) 2 % solution Use as directed 5 mLs in the mouth or throat every 6 (six) hours as needed for mouth pain. 06/20/19   [name], MD  nabumetone (RELAFEN) 750 MG tablet Take 1 tablet (750 mg total) by mouth 2 (two) times daily as needed. Patient not taking: Reported on 09/24/2019 07/02/19   Hilts, Legrand Como, MD     Positive ROS: All other systems have been reviewed and were otherwise negative with the exception of those mentioned in the HPI and as above.  Physical Exam: General: Alert, no acute distress Cardiovascular: No pedal edema Respiratory: No cyanosis, no use of accessory musculature GI: abdomen soft Skin: No lesions in the area of chief complaint Neurologic: Sensation intact distally Psychiatric: Patient is competent for consent with normal mood and affect Lymphatic: no lymphedema  MUSCULOSKELETAL: exam stable  Assessment: left knee degenerative joint disease  Plan: Plan for Procedure(s): LEFT  TOTAL KNEE ARTHROPLASTY  The risks benefits and alternatives were discussed with the patient including but not limited to the risks of nonoperative treatment, versus surgical intervention including infection, bleeding, nerve injury,  blood clots, cardiopulmonary complications, morbidity, mortality, among others, and they were willing to proceed.   Preoperative templating of the joint replacement has been completed, documented, and submitted to the Operating Room personnel in order to optimize intra-operative equipment management.   Eduard Roux, MD 10/01/2019 11:39 AM

## 2019-10-02 DIAGNOSIS — M1712 Unilateral primary osteoarthritis, left knee: Secondary | ICD-10-CM | POA: Diagnosis not present

## 2019-10-02 LAB — BASIC METABOLIC PANEL
Anion gap: 8 (ref 5–15)
BUN: 11 mg/dL (ref 6–20)
CO2: 25 mmol/L (ref 22–32)
Calcium: 8.3 mg/dL — ABNORMAL LOW (ref 8.9–10.3)
Chloride: 101 mmol/L (ref 98–111)
Creatinine, Ser: 0.71 mg/dL (ref 0.44–1.00)
GFR calc Af Amer: >60 mL/min (ref >60)
GFR calc non Af Amer: >60 mL/min (ref >60)
Glucose, Bld: 149 mg/dL — ABNORMAL HIGH (ref 70–99)
Potassium: 4.1 mmol/L (ref 3.5–5.1)
Sodium: 134 mmol/L — ABNORMAL LOW (ref 135–145)

## 2019-10-02 LAB — CBC
HCT: 32.3 % — ABNORMAL LOW (ref 36.0–46.0)
Hemoglobin: 11 g/dL — ABNORMAL LOW (ref 12.0–15.0)
MCH: 31.6 pg (ref 26.0–34.0)
MCHC: 34.1 g/dL (ref 30.0–36.0)
MCV: 92.8 fL (ref 80.0–100.0)
Platelets: 207 10*3/uL (ref 150–400)
RBC: 3.48 MIL/uL — ABNORMAL LOW (ref 3.87–5.11)
RDW: 13.2 % (ref 11.5–15.5)
WBC: 8.4 10*3/uL (ref 4.0–10.5)
nRBC: 0 % (ref 0.0–0.2)

## 2019-10-02 NOTE — Evaluation (Signed)
Physical Therapy One Time Evaluation Patient Details Name: Maria Bishop MRN: [id number] DOB: [birth year] Today's Date: 10/02/2019   History of Present Illness  Pt is a 59 year old female s/p L TKA  Clinical Impression  Patient evaluated by Physical Therapy with no further acute PT needs identified. All education has been completed and the patient has no further questions. Pt ambulated in hallway and practiced safe stair technique.  Pt also performed LE exercises.  Pt declined HEP handout however did write down exercises in her planner.  Pt eager to d/c home today. See below for any follow-up Physical Therapy or equipment needs. PT is signing off. Thank you for this referral.   Follow Up Recommendations Follow surgeon's recommendation for DC plan and follow-up therapies    Equipment Recommendations  None recommended by PT    Recommendations for Other Services       Precautions / Restrictions Precautions Precautions: Fall;Knee Restrictions Weight Bearing Restrictions: No      Mobility  Bed Mobility Overal bed mobility: Needs Assistance Bed Mobility: Supine to Sit     Supine to sit: Supervision        Transfers Overall transfer level: Needs assistance Equipment used: Rolling walker (2 wheeled) Transfers: Sit to/from Stand Sit to Stand: Min guard         General transfer comment: verbal cues for hand placement  Ambulation/Gait Ambulation/Gait assistance: Min guard Gait Distance (Feet): 200 Feet Assistive device: Rolling walker (2 wheeled) Gait Pattern/deviations: Step-to pattern;Decreased stance time - left;Antalgic     General Gait Details: verbal cues for sequence, RW positioning, step length, knee flexion  Stairs Stairs: Yes Stairs assistance: Min guard Stair Management: Forwards;One rail Left;Step to pattern Number of Stairs: 3 General stair comments: verbal cues for safety and technique  Wheelchair Mobility    Modified Rankin (Stroke Patients  Only)       Balance                                             Pertinent Vitals/Pain Pain Assessment: 0-10 Pain Score: 3  Pain Location: left knee Pain Descriptors / Indicators: Aching;Sore Pain Intervention(s): Repositioned;Monitored during session;Ice applied    Home Living Family/patient expects to be discharged to:: Private residence Living Arrangements: Spouse/significant other   Type of Home: House Home Access: Stairs to enter Entrance Stairs-Rails: Left Entrance Stairs-Number of Steps: 3 Home Layout: Able to live on main level with bedroom/bathroom;Laundry or work area in [place]: Environmental consultant - 2 wheels      Prior Function Level of Independence: Independent               Hand Dominance        Extremity/Trunk Assessment        Lower Extremity Assessment Lower Extremity Assessment: LLE deficits/detail LLE Deficits / Details: approx -5-45* AAROM left knee, limited by pain; able to perform ankle pumps       Communication   Communication: No difficulties  Cognition Arousal/Alertness: Awake/alert Behavior During Therapy: WFL for tasks assessed/performed Overall Cognitive Status: Within Functional Limits for tasks assessed                                        General Comments      Exercises Total Joint Exercises Ankle  Circles/Pumps: AROM;Both;10 reps Quad Sets: AROM;Both;10 reps Heel Slides: AAROM;Left;10 reps Hip ABduction/ADduction: AROM;Left;10 reps Straight Leg Raises: AAROM;Left;10 reps Knee Flexion: Seated;Left;AAROM;10 reps   Assessment/Plan    PT Assessment All further PT needs can be met in the next venue of care  PT Problem List Decreased strength;Decreased mobility;Decreased activity tolerance;Decreased knowledge of use of DME;Decreased range of motion;Pain       PT Treatment Interventions      PT Goals (Current goals can be found in the Care Plan section)  Acute Rehab PT  Goals PT Goal Formulation: All assessment and education complete, DC therapy    Frequency     Barriers to discharge        Co-evaluation               AM-PAC PT "6 Clicks" Mobility  Outcome Measure Help needed turning from your back to your side while in a flat bed without using bedrails?: A Little Help needed moving from lying on your back to sitting on the side of a flat bed without using bedrails?: A Little Help needed moving to and from a bed to a chair (including a wheelchair)?: A Little Help needed standing up from a chair using your arms (e.g., wheelchair or bedside chair)?: A Little Help needed to walk in hospital room?: A Little Help needed climbing 3-5 steps with a railing? : A Little 6 Click Score: 18    End of Session Equipment Utilized During Treatment: Gait belt Activity Tolerance: Patient tolerated treatment well Patient left: in chair;with call bell/phone within reach;with chair alarm set Nurse Communication: Mobility status PT Visit Diagnosis: Other abnormalities of gait and mobility (R26.89)    Time: 8350-7573 PT Time Calculation (min) (ACUTE ONLY): 34 min   Charges:   PT Evaluation $PT Eval Low Complexity: 1 Low PT Treatments $Therapeutic Exercise: 8-22 mins      Jannette Spanner PT, DPT Acute Rehabilitation Services Pager: [phone] Office: [place] 10/02/2019, 3:54 PM

## 2019-10-02 NOTE — TOC Transition Note (Signed)
Transition of Care [facility]) - CM/SW Discharge Note   Patient Details  Name: Maria Bishop MRN: [id number] Date of Birth: 07/01/1960  Transition of Care [facility]) CM/SW Contact:  Lennart Pall, LCSW Phone Number: 10/02/2019, 10:20 AM   Clinical Narrative:    Met briefly with pt and confirming DME and [place] arrangements already in place (see below).  REady for dc.  No further TOC needs.   Final next level of care: Home w Home Health Services Barriers to Discharge: No Barriers Identified   Patient Goals and CMS Choice Patient states their goals for this hospitalization and ongoing recovery are:: go home      Discharge Placement                       Discharge Plan and Services                DME Arranged: Continuous passive motion machine DME Agency: [employer] Date DME Agency Contacted:  (ordered prior to surgery;  delivered to home)     HH Arranged: PT HH Agency: [facility] (formerly [employer]) Date [place]:  (order/ referral placed via MD office prior to surgery)      Social Determinants of Health (SDOH) Interventions     Readmission Risk Interventions No flowsheet data found.

## 2019-10-02 NOTE — Discharge Summary (Signed)
Patient ID: Maria Bishop MRN: [id number] DOB/AGE: 1960-04-26 59 y.o.  Admit date: 10/01/2019 Discharge date: 10/02/2019  Admission Diagnoses:  Principal Problem:   Primary osteoarthritis of left knee Active Problems:   Status post total left knee replacement   Discharge Diagnoses:  Same  Past Medical History:  Diagnosis Date  . Anxiety   . CAD (coronary artery disease)    a. NSTEMI 2013: likely spontaneous dissection/ruptured plaque in OM3 - PCI: 3 overlapping Promus DES to OM3, EF 55% b. cath 2014: patent stents  . Cervical cancer (HCC)    Cervical cancer  . Degenerative disc disease   . Depression   . GERD (gastroesophageal reflux disease)   . Headache   . Heart murmur   . MRSA infection   . Myocardial infarction ([place])    ~58yrs ago  . NSTEMI (non-ST elevated myocardial infarction) ([place]) 06/24/2011   a. NSTEMI 2013: likely spontaneous dissection/ruptured plaque in OM3 - PCI: 3 overlapping Promus DES to OM3, EF 55%   . Pneumonia    As a child    Surgeries: Procedure(s): LEFT TOTAL KNEE ARTHROPLASTY on 10/01/2019   Consultants:   Discharged Condition: Improved  Hospital Course: Maria Bishop is an 59 y.o. female who was admitted 10/01/2019 for operative treatment ofPrimary osteoarthritis of left knee. Patient has severe unremitting pain that affects sleep, daily activities, and work/hobbies. After pre-op clearance the patient was taken to the operating room on 10/01/2019 and underwent  Procedure(s): LEFT TOTAL KNEE ARTHROPLASTY.    Patient was given perioperative antibiotics:  Anti-infectives (From admission, onward)   Start     Dose/Rate Route Frequency Ordered Stop   10/02/19 0030  vancomycin (VANCOCIN) IVPB 1000 mg/200 mL premix        1,000 mg 200 mL/hr over 60 Minutes Intravenous  Once 10/01/19 1802 10/02/19 0122   10/01/19 1745  ceFAZolin (ANCEF) IVPB 2g/100 mL premix  Status:  Discontinued        2 g 200 mL/hr over 30 Minutes Intravenous Every 6  hours 10/01/19 1738 10/01/19 1800   10/01/19 1443  vancomycin (VANCOCIN) powder  Status:  Discontinued          As needed 10/01/19 1443 10/01/19 1726   10/01/19 1045  vancomycin (VANCOCIN) IVPB 1000 mg/200 mL premix        1,000 mg 200 mL/hr over 60 Minutes Intravenous On call to O.R. 10/01/19 1040 10/01/19 1330       Patient was given sequential compression devices, early ambulation, and chemoprophylaxis to prevent DVT.  Patient benefited maximally from hospital stay and there were no complications.    Recent vital signs:  Patient Vitals for the past 24 hrs:  BP Temp Temp src Pulse Resp SpO2 Height Weight  10/02/19 0314 90/60 98.6 F (37 C) Oral 87 18 100 % -- --  10/01/19 2353 (!) 84/55 97.8 F (36.6 C) Oral 89 14 98 % -- --  10/01/19 2114 104/75 98.4 F (36.9 C) Oral 99 17 100 % -- --  10/01/19 2031 (!) 88/63 97.9 F (36.6 C) Oral 87 16 100 % -- --  10/01/19 1826 98/72 -- -- 91 17 97 % -- --  10/01/19 1733 106/76 98.2 F (36.8 C) Oral 92 18 100 % 5\' 2"  (1.575 m) 91.7 kg  10/01/19 1715 107/76 97.8 F (36.6 C) -- 98 12 100 % -- --  10/01/19 1700 108/80 -- -- 90 11 100 % -- --  10/01/19 1645 103/81 -- -- 91 10 100 % -- --  10/01/19 1630 110/85 98 F (36.7 C) -- 91 13 100 % -- --  10/01/19 1615 106/82 -- -- 87 10 100 % -- --  10/01/19 1600 102/74 -- -- 95 (!) 9 100 % -- --  10/01/19 1545 106/80 -- -- 86 15 96 % -- --  10/01/19 1534 109/83 98.6 F (37 C) -- 81 (!) 9 96 % -- --  10/01/19 1228 113/75 -- -- 95 18 100 % -- --  10/01/19 1223 108/74 -- -- 83 11 100 % -- --  10/01/19 1218 111/80 -- -- 87 11 100 % -- --  10/01/19 1213 119/86 -- -- 81 15 100 % -- --  10/01/19 1208 116/84 -- -- 90 14 100 % -- --  10/01/19 1158 115/83 -- -- 78 10 100 % -- --  10/01/19 1153 112/87 -- -- 80 11 100 % -- --  10/01/19 1148 117/85 -- -- 82 10 100 % -- --  10/01/19 1143 109/86 -- -- 82 11 100 % -- --  10/01/19 1138 109/85 -- -- 84 10 100 % -- --  10/01/19 1133 (!) 67/48 -- -- 94 20 99 %  -- --  10/01/19 1103 -- -- -- -- -- -- 5' 2.5" (1.588 m) 89.4 kg  10/01/19 1058 135/89 98.5 F (36.9 C) Oral (!) 110 16 100 % -- --     Recent laboratory studies:  Recent Labs    09/29/19 0843 09/29/19 0843 10/02/19 0234  WBC 3.7*  --  8.4  HGB 13.6  --  11.0*  HCT 40.4  --  32.3*  PLT 231  --  207  NA 139  --  134*  K 3.9  --  4.1  CL 106  --  101  CO2 27  --  25  BUN 11  --  11  CREATININE 0.71  --  0.71  GLUCOSE 100*  --  149*  INR 1.0  --   --   CALCIUM 9.2   < > 8.3*   < > = values in this interval not displayed.     Discharge Medications:   Allergies as of 10/02/2019      Reactions   Cefdinir Swelling   Gums, tongue -- mild   Statins    Failed Crestor 5 mg qd, Simvastatin 40 mg, and Lipitor 40 mg, and pravastatin 10 mg qd due to muscle and joint pain   Zetia [ezetimibe]    Depression and constipation per patient   Mobic [meloxicam] Rash   Shellfish Allergy Rash   Childhood reaction      Medication List    STOP taking these medications   BC HEADACHE POWDER PO   ibuprofen 200 MG tablet Commonly known as: ADVIL   nabumetone 750 MG tablet Commonly known as: RELAFEN   traMADol 50 MG tablet Commonly known as: ULTRAM     TAKE these medications   ALPRAZolam 0.5 MG tablet Commonly known as: XANAX TAKE 1 TABLET BY MOUTH 2 TIMES DAILY AS NEEDED What changed: See the new instructions.   aspirin EC 81 MG tablet Take 1 tablet (81 mg total) by mouth in the morning and at bedtime. What changed:   when to take this  additional instructions   cyclobenzaprine 10 MG tablet Commonly known as: FLEXERIL TAKE 1 TABLET BY MOUTH 3 TIMES DAILY AS NEEDED FOR MUSCLE SPASMS What changed: See the new instructions.   docusate sodium 100 MG capsule Commonly known as: Colace Take 1 capsule (100 mg total)  by mouth daily as needed.   fexofenadine 180 MG tablet Commonly known as: ALLEGRA Take 180 mg by mouth 3 (three) times a week. Reported on 06/02/2015    FLUoxetine 40 MG capsule Commonly known as: PROZAC TAKE 1 CAPSULE BY MOUTH EVERY DAY What changed: how much to take   lidocaine 2 % solution Commonly known as: XYLOCAINE Use as directed 5 mLs in the mouth or throat every 6 (six) hours as needed for mouth pain.   lisdexamfetamine 30 MG capsule Commonly known as: Vyvanse Take 1 capsule (30 mg total) by mouth daily.   nitroGLYCERIN 0.4 MG SL tablet Commonly known as: NITROSTAT Place 1 tablet (0.4 mg total) under the tongue every 5 (five) minutes as needed.   ondansetron 4 MG tablet Commonly known as: Zofran Take 1 tablet (4 mg total) by mouth every 8 (eight) hours as needed for nausea or vomiting.   oxyCODONE 10 mg 12 hr tablet Commonly known as: OXYCONTIN Take 1 tablet (10 mg total) by mouth every 12 (twelve) hours.   oxyCODONE-acetaminophen 5-325 MG tablet Commonly known as: Percocet Take 1-2 tablets by mouth every 6 (six) hours as needed for severe pain.   pantoprazole 40 MG tablet Commonly known as: PROTONIX TAKE 1 TABLET BY MOUTH EVERY DAY What changed: when to take this   Praluent 75 MG/ML [place] Generic drug: Alirocumab Inject 1 pen into the skin every 14 (fourteen) days. What changed: how much to take   pregabalin 75 MG capsule Commonly known as: LYRICA 1 PO BID, may increase to 2 PO BID if needed. What changed:   how much to take  how to take this  when to take this  additional instructions   VITAMIN B-12 PO Take 1 tablet by mouth daily.   Vitamin D3 25 MCG tablet Commonly known as: Vitamin D Take 1,000 Units by mouth daily.   zolpidem 10 MG tablet Commonly known as: AMBIEN TAKE 1 TABLET BY MOUTH NIGHTLY AT BEDTIME AS NEEDED FOR SLEEP What changed: See the new instructions.            Durable Medical Equipment  (From admission, onward)         Start     Ordered   10/01/19 1739  DME Walker rolling  Once       Question:  Patient needs a walker to treat with the following condition   Answer:  Total knee replacement status   10/01/19 1738   10/01/19 1739  DME 3 n 1  Once        10/01/19 1738   10/01/19 1739  DME Bedside commode  Once       Question:  Patient needs a bedside commode to treat with the following condition  Answer:  Total knee replacement status   10/01/19 1738          Diagnostic Studies: DG Chest 2 View  Result Date: 09/29/2019 CLINICAL DATA:  Preop for left knee replacement. EXAM: CHEST - 2 VIEW COMPARISON:  January 25, 2016. FINDINGS: The heart size and mediastinal contours are within normal limits. Both lungs are clear. The visualized skeletal structures are unremarkable. IMPRESSION: No active cardiopulmonary disease. Electronically Signed   By: Marijo Conception M.D.   On: 09/29/2019 08:57   MR Knee Left w/o contrast  Result Date: 09/14/2019 CLINICAL DATA:  Posterior left knee pain for 6 months.  Swelling. EXAM: MRI OF THE LEFT KNEE WITHOUT CONTRAST TECHNIQUE: Multiplanar, multisequence MR imaging of the knee was performed.  No intravenous contrast was administered. COMPARISON:  X-ray 03/12/2018 FINDINGS: MENISCI Medial meniscus: Marked intrasubstance degeneration with extensive complex tearing including a prominent radial tear of the midbody (series 12, images 7-8; series 7, image 20). Irregular horizontal and oblique tears also involve the posterior horn. Lateral meniscus: Mild intrasubstance degeneration without focal tear. LIGAMENTS Cruciates:  Intact ACL and PCL. Collaterals: MCL is medially bowed by extruded meniscal tissue and medial compartment osteophytosis. Mild periligamentous edema associated with the MCL without discrete tear. Lateral collateral ligament complex intact. CARTILAGE Patellofemoral: Moderate to severe chondral loss of the patellofemoral compartment. Medial: Extensive full-thickness cartilage loss involving the weight-bearing medial compartment with prominent subchondral marrow signal changes. Lateral:  Mild chondral thinning without  focal defect. Joint: Moderate to large knee joint effusion. 8 mm loose body within the intercondylar notch (series 11, image 19). Popliteal Fossa: Small complex Baker's cyst. Intact popliteus tendon. Extensor Mechanism: Intact quadriceps tendon and patellar tendon. Mild distal patellar tendinosis. Bones: The medial aspect of the inferior patella is truncated suggesting sequela of remote trauma (series 7, image 15). There is no marrow edema within the patella or lateral femoral condyle. Degenerative subchondral marrow signal changes within the medial compartment. Tricompartmental joint space narrowing with prominent marginal osteophyte formation. No evidence of acute fracture. No dislocation. TT-TG distance of 11 mm. Other: Mild prepatellar subcutaneous edema. IMPRESSION: 1. Advanced tricompartmental osteoarthritis with extensive full-thickness cartilage loss involving the weight-bearing medial compartment. 2. Marked intrasubstance degeneration with extensive complex tearing of the medial meniscus including a prominent radial tear of the midbody. 3. Moderate to large knee joint effusion with 8 mm loose body within the intercondylar notch. 4. Small complex Baker's cyst. 5. Truncated morphology medial aspect of the inferior patella suggesting sequela of remote trauma, possibly from prior patellar dislocation. No marrow edema within the patella or lateral femoral condyle to suggest a recent impaction injury. Electronically Signed   By: Davina Poke D.O.   On: 09/14/2019 10:00   DG Knee Left Port  Result Date: 10/01/2019 CLINICAL DATA:  Status post total knee replacement EXAM: PORTABLE LEFT KNEE - 1-2 VIEW COMPARISON:  March 12, 2018 FINDINGS: Frontal and lateral views were obtained. Patient is status post total knee replacement with femoral, tibial, and patellar prosthetic components well seated. No fracture or dislocation. No erosive change. There is soft tissue air within the joint, an expected postoperative  finding. IMPRESSION: Status post total knee replacement with prosthetic components well-seated. No fracture or dislocation. Acute postoperative changes noted. Electronically Signed   By: Lowella Grip III M.D.   On: 10/01/2019 16:16    Disposition: Discharge disposition: 01-Home or Self Care          Follow-up Information    Leandrew Koyanagi, MD. Schedule an appointment as soon as possible for a visit in 2 week(s).   Specialty: Orthopedic Surgery Contact information: [address] [place] [address] [phone]                Signed: Aundra Dubin 10/02/2019, 7:38 AM

## 2019-10-02 NOTE — Progress Notes (Signed)
Subjective: 1 Day Post-Op Procedure(s) (LRB): LEFT TOTAL KNEE ARTHROPLASTY (Left) Patient reports pain as moderate.    Objective: Vital signs in last 24 hours: Temp:  [97.8 F (36.6 C)-98.6 F (37 C)] 98.6 F (37 C) (08/26 0314) Pulse Rate:  [78-110] 87 (08/26 0314) Resp:  [9-20] 18 (08/26 0314) BP: (67-135)/(48-89) 90/60 (08/26 0314) SpO2:  [96 %-100 %] 100 % (08/26 0314) Weight:  [89.4 kg-91.7 kg] 91.7 kg (08/25 1733)  Intake/Output from previous day: 08/25 0701 - 08/26 0700 In: 3291.6 [P.O.:480; I.V.:2611.6; IV Piggyback:200] Out: 2805 [Urine:2730; Blood:75] Intake/Output this shift: No intake/output data recorded.  Recent Labs    09/29/19 0843 10/02/19 0234  HGB 13.6 11.0*   Recent Labs    09/29/19 0843 10/02/19 0234  WBC 3.7* 8.4  RBC 4.45 3.48*  HCT 40.4 32.3*  PLT 231 207   Recent Labs    09/29/19 0843 10/02/19 0234  NA 139 134*  K 3.9 4.1  CL 106 101  CO2 27 25  BUN 11 11  CREATININE 0.71 0.71  GLUCOSE 100* 149*  CALCIUM 9.2 8.3*   Recent Labs    09/29/19 0843  INR 1.0    Neurologically intact Neurovascular intact Sensation intact distally Intact pulses distally Dorsiflexion/Plantar flexion intact Incision: scant drainage No cellulitis present   Assessment/Plan: 1 Day Post-Op Procedure(s) (LRB): LEFT TOTAL KNEE ARTHROPLASTY (Left) Advance diet Up with therapy D/C IV fluids Discharge home with home health after second PT session as long as continues to mobilize WBAT LLE ABLA- mild and stable D/c foley   Anticipated LOS equal to or greater than 2 midnights due to - Age 57 and older with one or more of the following:  - Obesity  - Expected need for hospital services (PT, OT, Nursing) required for safe  discharge  - Anticipated need for postoperative skilled nursing care or inpatient rehab  - Active co-morbidities: Heart Attack OR   - Unanticipated findings during/Post Surgery: Slow post-op progression: GI, pain control,  mobility  - Patient is a high risk of re-admission due to: None    Maria Bishop 10/02/2019, 7:37 AM

## 2019-10-03 ENCOUNTER — Encounter (HOSPITAL_COMMUNITY): Payer: Self-pay | Admitting: Orthopaedic Surgery

## 2019-10-03 NOTE — Telephone Encounter (Signed)
Called pharmacy back. They state they dont need anything. I advised if they do need something to call me back.

## 2019-10-07 ENCOUNTER — Encounter: Payer: Self-pay | Admitting: Family Medicine

## 2019-10-08 ENCOUNTER — Other Ambulatory Visit: Payer: Self-pay | Admitting: Family Medicine

## 2019-10-10 ENCOUNTER — Encounter: Payer: Self-pay | Admitting: Orthopaedic Surgery

## 2019-10-10 ENCOUNTER — Ambulatory Visit (INDEPENDENT_AMBULATORY_CARE_PROVIDER_SITE_OTHER): Payer: 59 | Admitting: Physician Assistant

## 2019-10-10 DIAGNOSIS — Z96652 Presence of left artificial knee joint: Secondary | ICD-10-CM

## 2019-10-10 MED ORDER — OXYCODONE-ACETAMINOPHEN 5-325 MG PO TABS: 1.0000 | ORAL_TABLET | Freq: Three times a day (TID) | ORAL | 0 refills | Status: DC | PRN

## 2019-10-10 NOTE — Progress Notes (Signed)
0

## 2019-10-10 NOTE — Progress Notes (Signed)
Post-Op Visit Note   Patient: Maria Bishop           Date of Birth: 20-Jun-1960           MRN: [id number] Visit Date: 10/10/2019 PCP: [name], MD   Assessment & Plan:  Chief Complaint:  Chief Complaint  Patient presents with  . Left Knee - Pain   Visit Diagnoses:  1. History of total knee replacement, left     Plan: Patient is a very pleasant 59 year old female who comes in today 9 days out left total knee replacement 10/01/2019.  She has been doing well.  She has been getting home health physical therapy.  She has been taking Percocet which does help with her pain.  She still notes pain at night as she is unable to get comfortable as well as the inability to sit in a chair due to discomfort.  She is ambulating with one crutch today.  Examination of her left knee reveals a well-healing surgical incision without complication.  She does have moderate swelling.  Calf is soft nontender.  Today, I refilled her Percocet.  We will apply a new bandage which she will continue to wear for another week.  We will extend her home health physical therapy for another 3 weeks and then transition her to an outpatient setting.  Referral for this has been sent in.  Dental prophylaxis reinforced.  Follow-up with [place] in 5 weeks time for repeat evaluation and 2 view x-rays of the left knee.  Call with concerns or questions.  Follow-Up Instructions: Return in about 5 weeks (around 11/14/2019).   Orders:  Orders Placed This Encounter  Procedures  . Ambulatory referral to Physical Therapy   Meds ordered this encounter  Medications  . oxyCODONE-acetaminophen (PERCOCET) 5-325 MG tablet    Sig: Take 1-2 tablets by mouth 3 (three) times daily as needed for severe pain.    Dispense:  40 tablet    Refill:  0    Imaging: No new imaging  PMFS History: Patient Active Problem List   Diagnosis Date Noted  . Status post total left knee replacement 10/01/2019  . Binge eating disorder 08/20/2019   . Primary osteoarthritis of left knee 07/30/2019  . Morbid obesity ([place]) 03/10/2019  . Primary osteoarthritis of both knees 03/27/2018  . Primary osteoarthritis of both hands 03/27/2018  . DDD (degenerative disc disease), lumbar 03/27/2018  . DDD (degenerative disc disease), cervical 03/27/2018  . Vitamin D deficiency 01/24/2018  . Chest pain 01/25/2016  . Lumbar radiculopathy 08/06/2015  . Memory loss 12/02/2014  . History of cervical cancer 12/02/2014  . History of MI (myocardial infarction) 12/02/2014  . Insomnia 08/31/2014  . GERD (gastroesophageal reflux disease) 07/08/2013  . Abdominal pain, epigastric 07/08/2013  . Screening for malignant neoplasm of cervix 07/19/2012  . Routine general medical examination at a health care facility 07/19/2012  . Depression with anxiety 06/06/2012  . Hyperlipidemia 09/06/2011  . CAD (coronary artery disease) 06/24/2001   Past Medical History:  Diagnosis Date  . Anxiety   . CAD (coronary artery disease)    a. NSTEMI 2013: likely spontaneous dissection/ruptured plaque in OM3 - PCI: 3 overlapping Promus DES to OM3, EF 55% b. cath 2014: patent stents  . Cervical cancer (HCC)    Cervical cancer  . Degenerative disc disease   . Depression   . GERD (gastroesophageal reflux disease)   . Headache   . Heart murmur   . MRSA infection   . Myocardial  infarction [facility])    ~10yrs ago  . NSTEMI (non-ST elevated myocardial infarction) ([place]) 06/24/2011   a. NSTEMI 2013: likely spontaneous dissection/ruptured plaque in OM3 - PCI: 3 overlapping Promus DES to OM3, EF 55%   . Pneumonia    As a child    Family History  Problem Relation Age of Onset  . Alzheimer's disease Maternal Uncle   . Coronary artery disease Mother        MI in her 73s  . Early death Father   . Brain cancer Sister   . Drug abuse Son   . Asthma Daughter   . Anxiety disorder Daughter     Past Surgical History:  Procedure Laterality Date  . APPENDECTOMY    . [place]  bunionectomy right  February 2015  . CARDIAC CATHETERIZATION N/A 01/26/2016   Procedure: Left Heart Cath and Coronary Angiography;  Surgeon: Belva Crome, MD;  Location: [facility];  Service: Cardiovascular;  Laterality: N/A;  . CERVICAL BIOPSY    . LEFT HEART CATHETERIZATION WITH CORONARY ANGIOGRAM N/A 06/24/2011   Procedure: LEFT HEART CATHETERIZATION WITH CORONARY ANGIOGRAM;  Surgeon: Jettie Booze, MD;  Location: [facility] CATH LAB;  Service: Cardiovascular;  Laterality: N/A;  . PERCUTANEOUS CORONARY STENT INTERVENTION (PCI-S) Right 06/24/2011   Procedure: PERCUTANEOUS CORONARY STENT INTERVENTION (PCI-S);  Surgeon: Jettie Booze, MD;  Location: [facility] CATH LAB;  Service: Cardiovascular;  Laterality: Right;  . TOTAL KNEE ARTHROPLASTY Left 10/01/2019   Procedure: LEFT TOTAL KNEE ARTHROPLASTY;  Surgeon: Leandrew Koyanagi, MD;  Location: [facility] ORS;  Service: Orthopedics;  Laterality: Left;  . TUBAL LIGATION    . WISDOM TOOTH EXTRACTION     Social History   Occupational History  . Occupation: Customer service manager  Tobacco Use  . Smoking status: Former Smoker    Types: Cigarettes  . Smokeless tobacco: Never Used  . Tobacco comment: quit over 30 years ago   Vaping Use  . Vaping Use: Never used  Substance and Sexual Activity  . Alcohol use: Yes    Alcohol/week: 0.0 standard drinks    Comment: Occasional  . Drug use: No  . Sexual activity: Yes    Birth control/protection: Condom, Surgical    Comment: Tubal ligation

## 2019-10-14 ENCOUNTER — Telehealth: Payer: Self-pay

## 2019-10-14 ENCOUNTER — Encounter: Payer: Self-pay | Admitting: Orthopaedic Surgery

## 2019-10-14 NOTE — Telephone Encounter (Signed)
Verbal given 

## 2019-10-14 NOTE — Telephone Encounter (Signed)
Jarrett Soho called from [facility] stating Maria Bishop is requesting another week for home health therapy she cant drive at the moment and will give [place] a call regarding next appointment   Call back:[phone]

## 2019-10-17 ENCOUNTER — Encounter: Payer: Self-pay | Admitting: Family Medicine

## 2019-10-20 ENCOUNTER — Other Ambulatory Visit: Payer: Self-pay | Admitting: Family Medicine

## 2019-10-20 NOTE — Telephone Encounter (Signed)
Last OV 08/20/19 Alprazolam last filled 07/25/19 #30 with 3

## 2019-10-25 ENCOUNTER — Other Ambulatory Visit: Payer: Self-pay | Admitting: Family Medicine

## 2019-10-26 ENCOUNTER — Encounter: Payer: Self-pay | Admitting: Family Medicine

## 2019-10-27 ENCOUNTER — Telehealth: Payer: Self-pay | Admitting: Family Medicine

## 2019-10-27 ENCOUNTER — Encounter: Payer: Self-pay | Admitting: General Practice

## 2019-10-27 NOTE — Telephone Encounter (Signed)
Please advise 

## 2019-10-27 NOTE — Telephone Encounter (Signed)
Wanted to confirm if a PA for Pantoprazole 40 mg was received on Friday 9/17.    Will get pharmacy to fax over as well.

## 2019-10-27 NOTE — Telephone Encounter (Signed)
The 30mg  dose was only to take for 2 weeks and then you were to increase to the 50mg  daily dose.  This prescription was sent at the same time as the 30mg  dose and should be available for pick up.

## 2019-10-27 NOTE — Telephone Encounter (Signed)
Last OV 08/20/19 Vyvanse last filled 08/20/19 #14 with 0

## 2019-10-28 NOTE — Telephone Encounter (Signed)
PA was never received.

## 2019-10-28 NOTE — Telephone Encounter (Signed)
PA began in covermymeds

## 2019-10-29 ENCOUNTER — Other Ambulatory Visit: Payer: Self-pay

## 2019-10-29 ENCOUNTER — Ambulatory Visit (INDEPENDENT_AMBULATORY_CARE_PROVIDER_SITE_OTHER): Payer: 59 | Admitting: Family Medicine

## 2019-10-29 ENCOUNTER — Encounter: Payer: Self-pay | Admitting: Family Medicine

## 2019-10-29 VITALS — BP 125/80 | HR 100 | Temp 98.7°F | Resp 16 | Ht 63.0 in | Wt 197.2 lb

## 2019-10-29 DIAGNOSIS — R000 Tachycardia, unspecified: Secondary | ICD-10-CM | POA: Diagnosis not present

## 2019-10-29 DIAGNOSIS — F5081 Binge eating disorder: Secondary | ICD-10-CM

## 2019-10-29 DIAGNOSIS — N898 Other specified noninflammatory disorders of vagina: Secondary | ICD-10-CM | POA: Diagnosis not present

## 2019-10-29 LAB — CBC WITH DIFFERENTIAL/PLATELET
Basophils Absolute: 0 10*3/uL (ref 0.0–0.1)
Basophils Relative: 0.5 % (ref 0.0–3.0)
Eosinophils Absolute: 0.2 10*3/uL (ref 0.0–0.7)
Eosinophils Relative: 4.9 % (ref 0.0–5.0)
HCT: 34.2 % — ABNORMAL LOW (ref 36.0–46.0)
Hemoglobin: 11.8 g/dL — ABNORMAL LOW (ref 12.0–15.0)
Lymphocytes Relative: 32.2 % (ref 12.0–46.0)
Lymphs Abs: 1.2 10*3/uL (ref 0.7–4.0)
MCHC: 34.4 g/dL (ref 30.0–36.0)
MCV: 89.5 fl (ref 78.0–100.0)
Monocytes Absolute: 0.3 10*3/uL (ref 0.1–1.0)
Monocytes Relative: 7.5 % (ref 3.0–12.0)
Neutro Abs: 2.1 10*3/uL (ref 1.4–7.7)
Neutrophils Relative %: 54.9 % (ref 43.0–77.0)
Platelets: 257 10*3/uL (ref 150.0–400.0)
RBC: 3.82 Mil/uL — ABNORMAL LOW (ref 3.87–5.11)
RDW: 13.7 % (ref 11.5–15.5)
WBC: 3.8 10*3/uL — ABNORMAL LOW (ref 4.0–10.5)

## 2019-10-29 LAB — BASIC METABOLIC PANEL
BUN: 8 mg/dL (ref 6–23)
CO2: 29 mEq/L (ref 19–32)
Calcium: 9.1 mg/dL (ref 8.4–10.5)
Chloride: 103 mEq/L (ref 96–112)
Creatinine, Ser: 0.73 mg/dL (ref 0.40–1.20)
GFR: 82.2 mL/min (ref >60.00)
Glucose, Bld: 75 mg/dL (ref 70–99)
Potassium: 3.7 mEq/L (ref 3.5–5.1)
Sodium: 140 mEq/L (ref 135–145)

## 2019-10-29 LAB — TSH: TSH: 1.43 u[IU]/mL (ref 0.35–4.50)

## 2019-10-29 NOTE — Progress Notes (Signed)
   Subjective:    Patient ID: Maria Bishop, female    DOB: August 17, 1960, 59 y.o.   MRN: [id number]  HPI Tachycardia- pt reports HR 'has been elevated since June or July'.  HH PT (after TKR) noted elevated HR.  Pt reports the only time BP is below 100 is 'when I take a Xanax'.  Even with Xanax HR is in the upper 80s.  Pt states she doesn't feel palpitations but she does have increased fatigue recently.  No associated HTN.  Tachycardia predated starting Vyvanse  'blood bumps' on my vagina- initially thought it was a tick but looked in the mirror and noted a cluster on each side.  Not painful.  No drainage.  No recent intercourse.  Binge eating- pt reports she is doing very well on the Vyvanse.  Feels 'it really does help me' both with binge eating and concentration.  Down 5 lbs   Review of Systems For ROS see HPI   This visit occurred during the SARS-CoV-2 public health emergency.  Safety protocols were in place, including screening questions prior to the visit, additional usage of staff PPE, and extensive cleaning of exam room while observing appropriate contact time as indicated for disinfecting solutions.       Objective:   Physical Exam Vitals reviewed.  Constitutional:      General: She is not in acute distress.    Appearance: Normal appearance. She is well-developed.  HENT:     Head: Normocephalic and atraumatic.  Eyes:     Conjunctiva/sclera: Conjunctivae normal.     Pupils: Pupils are equal, round, and reactive to light.  Neck:     Thyroid: No thyromegaly.  Cardiovascular:     Rate and Rhythm: Normal rate and regular rhythm.     Heart sounds: Normal heart sounds. No murmur heard.   Pulmonary:     Effort: Pulmonary effort is normal. No respiratory distress.     Breath sounds: Normal breath sounds.  Abdominal:     General: There is no distension.     Palpations: Abdomen is soft.     Tenderness: There is no abdominal tenderness.  Genitourinary:    Labia:         Right: Lesion (~0.5cm bluish firm lesion, no TTP) present.        Left: No lesion (scattered angiomas).     Musculoskeletal:     Cervical back: Normal range of motion and neck supple.  Lymphadenopathy:     Cervical: No cervical adenopathy.  Skin:    General: Skin is warm and dry.  Neurological:     Mental Status: She is alert and oriented to person, place, and time.  Psychiatric:        Behavior: Behavior normal.           Assessment & Plan:  Vaginal lesion- new.  Pt has bluish lesion on R labia.  No TTP.  Suspect this may be a blue nevus but will refer to GYN to assess.  Pt expressed understanding and is in agreement w/ plan.

## 2019-10-29 NOTE — Assessment & Plan Note (Signed)
Improved since starting Vyvanse.  Pt is very pleased with the results.  Her tachycardia predated the Vyvanse so this is not likely to be the cause.  Will follow.

## 2019-10-29 NOTE — Patient Instructions (Signed)
Follow up as needed or as scheduled We'll notify you of your lab results and make any changes if needed We'll call you with your GYN appt to assess that 1 larger spot I'm SO glad the Vyvanse is working! Call with any questions or concerns Hang in there!!

## 2019-10-29 NOTE — Assessment & Plan Note (Signed)
New.  Pt's HR has been hovering around 100 (sometimes higher, sometimes lower) since at least May.  EKG w/o obvious abnormality or arrhythmia.  Pt is not interested in starting beta blocker at this time.  Will check labs to r/o underlying cause and if no causes found, will refer to back to Cards.  Pt expressed understanding and is in agreement w/ plan.

## 2019-10-30 ENCOUNTER — Encounter: Payer: Self-pay | Admitting: Orthopaedic Surgery

## 2019-10-30 ENCOUNTER — Other Ambulatory Visit: Payer: Self-pay | Admitting: General Practice

## 2019-10-30 DIAGNOSIS — I471 Supraventricular tachycardia, unspecified: Secondary | ICD-10-CM

## 2019-10-31 ENCOUNTER — Other Ambulatory Visit: Payer: Self-pay | Admitting: Family Medicine

## 2019-10-31 NOTE — Telephone Encounter (Signed)
Patient called and said that she will need this medication to be called in today - She will be out of the medication this weekend -

## 2019-10-31 NOTE — Telephone Encounter (Signed)
Told patient that rx was sent in to [facility].

## 2019-11-01 ENCOUNTER — Encounter: Payer: Self-pay | Admitting: Family Medicine

## 2019-11-07 ENCOUNTER — Encounter: Payer: Self-pay | Admitting: Obstetrics and Gynecology

## 2019-11-07 ENCOUNTER — Ambulatory Visit (INDEPENDENT_AMBULATORY_CARE_PROVIDER_SITE_OTHER): Payer: 59 | Admitting: Obstetrics and Gynecology

## 2019-11-07 ENCOUNTER — Other Ambulatory Visit: Payer: Self-pay

## 2019-11-07 ENCOUNTER — Other Ambulatory Visit (HOSPITAL_COMMUNITY)
Admission: RE | Admit: 2019-11-07 | Discharge: 2019-11-07 | Disposition: A | Discharge status: Home / Self Care | Payer: 59 | Source: Ambulatory Visit | Attending: Obstetrics and Gynecology | Admitting: Obstetrics and Gynecology

## 2019-11-07 VITALS — BP 118/62 | HR 108 | Resp 14 | Ht 61.5 in | Wt 197.1 lb

## 2019-11-07 DIAGNOSIS — N9089 Other specified noninflammatory disorders of vulva and perineum: Secondary | ICD-10-CM

## 2019-11-07 NOTE — Patient Instructions (Signed)

## 2019-11-07 NOTE — Progress Notes (Signed)
59 y.o. G2P2 Single White or Caucasian Not Hispanic or Latino female here for new patient visit from Dr Birdie Riddle for evaluation of a vulvar lesion. The lesion was noted on routine exam. The patient denies, pain, irritation or itching. The lesion is new.    Patient's last menstrual period was 07/05/2012.          Sexually active: No.  The current method of family planning is post menopausal status.    Exercising: Yes.    physical therapy for knee Smoker:  No- former smoker  Health Maintenance: Pap:  01-24-18 negative, HR HPV negative            04-24-14 negative  History of abnormal Pap:  yes MMG:  10-11-17 density B/BIRADS 1 negative BMD:  none Colonoscopy: 07-02-15 Cologuard negative  TDaP:  05-27-14  Gardasil: none   reports that she has quit smoking. Her smoking use included cigarettes. She has never used smokeless tobacco. She reports current alcohol use. She reports that she does not use drugs. She works as an Primary school teacher.   Past Medical History:  Diagnosis Date  . Anxiety   . CAD (coronary artery disease)    a. NSTEMI 2013: likely spontaneous dissection/ruptured plaque in OM3 - PCI: 3 overlapping Promus DES to OM3, EF 55% b. cath 2014: patent stents  . Cervical cancer (HCC)    Cervical cancer  . Degenerative disc disease   . Depression   . GERD (gastroesophageal reflux disease)   . Headache   . Heart murmur   . MRSA infection   . Myocardial infarction ([place])    ~69yrs ago  . NSTEMI (non-ST elevated myocardial infarction) ([place]) 06/24/2011   a. NSTEMI 2013: likely spontaneous dissection/ruptured plaque in OM3 - PCI: 3 overlapping Promus DES to OM3, EF 55%   . Pneumonia    As a child    Past Surgical History:  Procedure Laterality Date  . APPENDECTOMY    . [place] bunionectomy right  February 2015  . CARDIAC CATHETERIZATION N/A 01/26/2016   Procedure: Left Heart Cath and Coronary Angiography;  Surgeon: Belva Crome, MD;  Location: [place] CV LAB;  Service:  Cardiovascular;  Laterality: N/A;  . CERVICAL BIOPSY    . LEFT HEART CATHETERIZATION WITH CORONARY ANGIOGRAM N/A 06/24/2011   Procedure: LEFT HEART CATHETERIZATION WITH CORONARY ANGIOGRAM;  Surgeon: Jettie Booze, MD;  Location: [facility] CATH LAB;  Service: Cardiovascular;  Laterality: N/A;  . PERCUTANEOUS CORONARY STENT INTERVENTION (PCI-S) Right 06/24/2011   Procedure: PERCUTANEOUS CORONARY STENT INTERVENTION (PCI-S);  Surgeon: Jettie Booze, MD;  Location: [facility] CATH LAB;  Service: Cardiovascular;  Laterality: Right;  . TOTAL KNEE ARTHROPLASTY Left 10/01/2019   Procedure: LEFT TOTAL KNEE ARTHROPLASTY;  Surgeon: Leandrew Koyanagi, MD;  Location: [facility] ORS;  Service: Orthopedics;  Laterality: Left;  . TUBAL LIGATION    . WISDOM TOOTH EXTRACTION      Current Outpatient Medications  Medication Sig Dispense Refill  . Alirocumab (PRALUENT) 75 MG/ML SOAJ Inject 1 pen into the skin every 14 (fourteen) days. (Patient taking differently: Inject 75 mg into the skin every 14 (fourteen) days. ) 2 pen 11  . ALPRAZolam (XANAX) 0.5 MG tablet TAKE 1 TABLET BY MOUTH 2 TIMES DAILY AS NEEDED 30 tablet 3  . aspirin EC 81 MG tablet Take 1 tablet (81 mg total) by mouth in the morning and at bedtime. 84 tablet 0  . Cyanocobalamin (VITAMIN B-12 PO) Take 1 tablet by mouth daily.    . cyclobenzaprine (  FLEXERIL) 10 MG tablet TAKE 1 TABLET BY MOUTH 3 TIMES DAILY AS NEEDED FOR MUSCLE SPASMS 30 tablet 0  . docusate sodium (COLACE) 100 MG capsule Take 1 capsule (100 mg total) by mouth daily as needed. 30 capsule 2  . fexofenadine (ALLEGRA) 180 MG tablet Take 180 mg by mouth 3 (three) times a week. Reported on 06/02/2015    . FLUoxetine (PROZAC) 40 MG capsule TAKE 1 CAPSULE BY MOUTH EVERY DAY 90 capsule 0  . lidocaine (XYLOCAINE) 2 % solution Use as directed 5 mLs in the mouth or throat every 6 (six) hours as needed for mouth pain. 100 mL 0  . nitroGLYCERIN (NITROSTAT) 0.4 MG SL tablet Place 1 tablet (0.4 mg total) under the  tongue every 5 (five) minutes as needed. 25 tablet 3  . ondansetron (ZOFRAN) 4 MG tablet Take 1 tablet (4 mg total) by mouth every 8 (eight) hours as needed for nausea or vomiting. 40 tablet 0  . pantoprazole (PROTONIX) 40 MG tablet TAKE 1 TABLET BY MOUTH EVERY DAY 90 tablet 0  . pregabalin (LYRICA) 75 MG capsule Take 2 capsules (150 mg total) by mouth in the morning and at bedtime. 120 capsule 3  . traMADol (ULTRAM) 50 MG tablet Take 1 tablet (50 mg total) by mouth every 8 (eight) hours as needed (pain.). 30 tablet 0  . Vitamin D3 (VITAMIN D) 25 MCG tablet Take 1,000 Units by mouth daily.    Marland Kitchen zolpidem (AMBIEN) 10 MG tablet TAKE 1 TABLET BY MOUTH NIGHTLY AT BEDTIME AS NEEDED FOR SLEEP (Patient taking differently: Take 10 mg by mouth at bedtime. ) 30 tablet 3   No current facility-administered medications for this visit.    Family History  Problem Relation Age of Onset  . Alzheimer's disease Maternal Uncle   . Coronary artery disease Mother        MI in her 13s  . Hypertension Mother   . Early death Father   . Brain cancer Sister   . Drug abuse Son   . Asthma Daughter   . Anxiety disorder Daughter   . Breast cancer Maternal Grandmother     Review of Systems  Genitourinary:       Vaginal lesion- no itching or irritation  All other systems reviewed and are negative.   Exam:   BP 118/62 (BP Location: Right Arm, Patient Position: Sitting, Cuff Size: Normal)   Pulse (!) 108   Resp 14   Ht 5' 1.5" (1.562 m)   Wt 197 lb 1.6 oz (89.4 kg)   LMP 07/05/2012   BMI 36.64 kg/m   Weight change: @WEIGHTCHANGE @ Height:   Height: 5' 1.5" (156.2 cm)  Ht Readings from Last 3 Encounters:  11/07/19 5' 1.5" (1.562 m)  10/29/19 5\' 3"  (1.6 m)  10/01/19 5\' 2"  (1.575 m)    General appearance: alert, cooperative and appears stated age    Pelvic: External genitalia: there are a few cherry angiomas on the vulva, in addition there is a small, ~3 mm, raised, black lesion on the right labia majora.  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               Urethra:  normal appearing urethra with no masses, tenderness or lesions              Bartholins and Skenes: normal     The risks of the procedure were reviewed with the patient and a consent was signed. The area was cleansed with Hibiclens and injected  with 1% lidocaine. A 4 mm punch biopsy was used to remove a circular piece of tissue. The defect was closed with 4-0 vicryl. The patient tolerated the procedure well.                 Karmen Bongo chaperoned for the exam.  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

## 2019-11-10 ENCOUNTER — Other Ambulatory Visit: Payer: Self-pay

## 2019-11-12 LAB — SURGICAL PATHOLOGY

## 2019-11-14 ENCOUNTER — Ambulatory Visit (INDEPENDENT_AMBULATORY_CARE_PROVIDER_SITE_OTHER): Payer: 59 | Admitting: Orthopaedic Surgery

## 2019-11-14 ENCOUNTER — Other Ambulatory Visit: Payer: Self-pay

## 2019-11-14 ENCOUNTER — Encounter: Payer: Self-pay | Admitting: Orthopaedic Surgery

## 2019-11-14 ENCOUNTER — Encounter: Payer: Self-pay | Admitting: Family Medicine

## 2019-11-14 ENCOUNTER — Ambulatory Visit: Payer: Self-pay

## 2019-11-14 DIAGNOSIS — Z96652 Presence of left artificial knee joint: Secondary | ICD-10-CM

## 2019-11-14 NOTE — Progress Notes (Signed)
Post-Op Visit Note   Patient: Maria Bishop           Date of Birth: 04/11/1960           MRN: [id number] Visit Date: 11/14/2019 PCP: [name], MD   Assessment & Plan:  Chief Complaint:  Chief Complaint  Patient presents with  . Left Knee - Pain   Visit Diagnoses:  1. History of total knee replacement, left     Plan: Patient is a 6-week status post left total knee replacement uncemented.  She is doing well and just has the only complaint of some start up stiffness.  She is doing home exercises at this point as physical therapy was too expensive.  She is currently working part-time.  She is overall doing very well and very pleased with her outcome thus far.  Surgical scar is fully healed.  Range of motion is 0 to 101 degrees.  No signs of infection.  Mild swelling.  From my standpoint she is doing very well and I think it is fine for her to return back to work on October 23rd without restrictions.  She will continue with home exercises to improve her flexion.  Dental prophylaxis reinforced.  She will drop back to her original dose of baby aspirin before surgery.  Follow-up in 6 weeks with two-view x-rays of the left knee.  Follow-Up Instructions: Return in about 6 weeks (around 12/26/2019).   Orders:  Orders Placed This Encounter  Procedures  . XR Knee 1-2 Views Left   No orders of the defined types were placed in this encounter.   Imaging: XR Knee 1-2 Views Left  Result Date: 11/14/2019 Stable total knee replacement in good alignment.    PMFS History: Patient Active Problem List   Diagnosis Date Noted  . Tachycardia 10/29/2019  . Status post total left knee replacement 10/01/2019  . Binge eating disorder 08/20/2019  . Primary osteoarthritis of left knee 07/30/2019  . Morbid obesity ([place]) 03/10/2019  . Primary osteoarthritis of both knees 03/27/2018  . Primary osteoarthritis of both hands 03/27/2018  . DDD (degenerative disc disease), lumbar  03/27/2018  . DDD (degenerative disc disease), cervical 03/27/2018  . Vitamin D deficiency 01/24/2018  . Chest pain 01/25/2016  . Lumbar radiculopathy 08/06/2015  . Memory loss 12/02/2014  . History of cervical cancer 12/02/2014  . History of MI (myocardial infarction) 12/02/2014  . Insomnia 08/31/2014  . GERD (gastroesophageal reflux disease) 07/08/2013  . Abdominal pain, epigastric 07/08/2013  . Screening for malignant neoplasm of cervix 07/19/2012  . Routine general medical examination at a health care facility 07/19/2012  . Depression with anxiety 06/06/2012  . Hyperlipidemia 09/06/2011  . CAD (coronary artery disease) 06/24/2001   Past Medical History:  Diagnosis Date  . Anxiety   . CAD (coronary artery disease)    a. NSTEMI 2013: likely spontaneous dissection/ruptured plaque in OM3 - PCI: 3 overlapping Promus DES to OM3, EF 55% b. cath 2014: patent stents  . Cervical cancer (HCC)    Cervical cancer  . Degenerative disc disease   . Depression   . GERD (gastroesophageal reflux disease)   . Headache   . Heart murmur   . MRSA infection   . Myocardial infarction ([place])    ~49yrs ago  . NSTEMI (non-ST elevated myocardial infarction) ([place]) 06/24/2011   a. NSTEMI 2013: likely spontaneous dissection/ruptured plaque in OM3 - PCI: 3 overlapping Promus DES to OM3, EF 55%   . Pneumonia    As a  child    Family History  Problem Relation Age of Onset  . Alzheimer's disease Maternal Uncle   . Coronary artery disease Mother        MI in her 60s  . Hypertension Mother   . Early death Father   . Brain cancer Sister   . Drug abuse Son   . Asthma Daughter   . Anxiety disorder Daughter   . Breast cancer Maternal Grandmother     Past Surgical History:  Procedure Laterality Date  . APPENDECTOMY    . [place] bunionectomy right  February 2015  . CARDIAC CATHETERIZATION N/A 01/26/2016   Procedure: Left Heart Cath and Coronary Angiography;  Surgeon: Belva Crome, MD;  Location: [place] CV LAB;  Service: Cardiovascular;  Laterality: N/A;  . CERVICAL BIOPSY    . LEFT HEART CATHETERIZATION WITH CORONARY ANGIOGRAM N/A 06/24/2011   Procedure: LEFT HEART CATHETERIZATION WITH CORONARY ANGIOGRAM;  Surgeon: Jettie Booze, MD;  Location: [facility] CATH LAB;  Service: Cardiovascular;  Laterality: N/A;  . PERCUTANEOUS CORONARY STENT INTERVENTION (PCI-S) Right 06/24/2011   Procedure: PERCUTANEOUS CORONARY STENT INTERVENTION (PCI-S);  Surgeon: Jettie Booze, MD;  Location: [facility] CATH LAB;  Service: Cardiovascular;  Laterality: Right;  . TOTAL KNEE ARTHROPLASTY Left 10/01/2019   Procedure: LEFT TOTAL KNEE ARTHROPLASTY;  Surgeon: Leandrew Koyanagi, MD;  Location: [facility] OR[facility];  Service: Orthopedics;  Laterality: Left;  . TUBAL LIGATION    . WISDOM TOOTH EXTRACTION     Social History   Occupational History  . Occupation: Customer service manager  Tobacco Use  . Smoking status: Former Smoker    Types: Cigarettes  . Smokeless tobacco: Never Used  . Tobacco comment: quit over 30 years ago   Vaping Use  . Vaping Use: Never used  Substance and Sexual Activity  . Alcohol use: Yes    Alcohol/week: 0.0 standard drinks    Comment: Occasional  . Drug use: No  . Sexual activity: Not Currently    Birth control/protection: Condom, Surgical, Post-menopausal    Comment: Tubal ligation

## 2019-11-17 ENCOUNTER — Other Ambulatory Visit: Payer: Self-pay | Admitting: Family Medicine

## 2019-11-17 MED ORDER — AMPHETAMINE-DEXTROAMPHET ER 30 MG PO CP24: 30.0000 mg | ORAL_CAPSULE | ORAL | 0 refills | Status: DC

## 2019-11-26 ENCOUNTER — Other Ambulatory Visit: Payer: Self-pay | Admitting: Family Medicine

## 2019-11-26 DIAGNOSIS — F5101 Primary insomnia: Secondary | ICD-10-CM

## 2019-11-26 NOTE — Telephone Encounter (Signed)
LFD 07/09/19 #30 with 3 refills LOV 10/29/19 NOV none

## 2019-11-27 ENCOUNTER — Ambulatory Visit (INDEPENDENT_AMBULATORY_CARE_PROVIDER_SITE_OTHER): Payer: 59 | Admitting: Internal Medicine

## 2019-11-27 ENCOUNTER — Telehealth: Payer: Self-pay | Admitting: Radiology

## 2019-11-27 ENCOUNTER — Other Ambulatory Visit: Payer: Self-pay

## 2019-11-27 ENCOUNTER — Encounter: Payer: Self-pay | Admitting: Internal Medicine

## 2019-11-27 VITALS — BP 122/64 | HR 104 | Resp 12 | Ht 62.0 in | Wt 197.0 lb

## 2019-11-27 DIAGNOSIS — I2510 Atherosclerotic heart disease of native coronary artery without angina pectoris: Secondary | ICD-10-CM | POA: Diagnosis not present

## 2019-11-27 DIAGNOSIS — I471 Supraventricular tachycardia: Secondary | ICD-10-CM | POA: Diagnosis not present

## 2019-11-27 DIAGNOSIS — E7800 Pure hypercholesterolemia, unspecified: Secondary | ICD-10-CM | POA: Diagnosis not present

## 2019-11-27 DIAGNOSIS — D649 Anemia, unspecified: Secondary | ICD-10-CM

## 2019-11-27 LAB — CBC
Hematocrit: 39.1 % (ref 34.0–46.6)
Hemoglobin: 13 g/dL (ref 11.1–15.9)
MCH: 29 pg (ref 26.6–33.0)
MCHC: 33.2 g/dL (ref 31.5–35.7)
MCV: 87 fL (ref 79–97)
Platelets: 273 10*3/uL (ref 150–450)
RBC: 4.48 x10E6/uL (ref 3.77–5.28)
RDW: 14.7 % (ref 11.7–15.4)
WBC: 4.6 10*3/uL (ref 3.4–10.8)

## 2019-11-27 NOTE — Patient Instructions (Addendum)
Medication Instructions:  Your physician recommends that you continue on your current medications as directed. Please refer to the Current Medication list given to you today.  *If you need a refill on your cardiac medications before your next appointment, please call your pharmacy*   Lab Work: Lab work to be done today--CBC If you have labs (blood work) drawn today and your tests are completely normal, you will receive your results only by: Marland Kitchen MyChart Message (if you have MyChart) OR . A paper copy in the mail If you have any lab test that is abnormal or we need to change your treatment, we will call you to review the results.   Testing/Procedures: Your physician has requested that you have an echocardiogram. Echocardiography is a painless test that uses sound waves to create images of your heart. It provides your doctor with information about the size and shape of your heart and how well your heart's chambers and valves are working. This procedure takes approximately one hour. There are no restrictions for this procedure.  Your physician has recommended that you wear a holter monitor. Holter monitors are medical devices that record the heart's electrical activity. Doctors most often use these monitors to diagnose arrhythmias. Arrhythmias are problems with the speed or rhythm of the heartbeat. The monitor is a small, portable device. You can wear one while you do your normal daily activities. This is usually used to diagnose what is causing palpitations/syncope (passing out).  14 day Zio    Follow-Up: At [facility], you and your health needs are our priority.  As part of our continuing mission to provide you with exceptional heart care, we have created designated Provider Care Teams.  These Care Teams include your primary Cardiologist (physician) and Advanced Practice Providers (APPs -  Physician Assistants and Nurse Practitioners) who all work together to provide you with the care you need,  when you need it.  We recommend signing up for the patient portal called "MyChart".  Sign up information is provided on this After Visit Summary.  MyChart is used to connect with patients for Virtual Visits (Telemedicine).  Patients are able to view lab/test results, encounter notes, upcoming appointments, etc.  Non-urgent messages can be sent to your provider as well.   To learn more about what you can do with MyChart, go to [email or web address].    Your next appointment:   3-4 months  The format for your next appointment:   In Person  Provider:   Rudean Haskell, MD   Other Instructions  ZIO XT- Long Term Monitor Instructions   Your physician has requested you wear your ZIO patch monitor_______days.   This is a single patch monitor.  [employer] supplies one patch monitor per enrollment.  Additional stickers are not available.   Please do not apply patch if you will be having a Nuclear Stress Test, Echocardiogram, Cardiac CT, MRI, or Chest Xray during the time frame you would be wearing the monitor. The patch cannot be worn during these tests.  You cannot remove and re-apply the ZIO XT patch monitor.   Your ZIO patch monitor will be sent USPS Priority mail from [facility] directly to your home address. The monitor may also be mailed to a PO BOX if home delivery is not available.   It may take 3-5 days to receive your monitor after you have been enrolled.   Once you have received you monitor, please review enclosed instructions.  Your monitor has already been registered assigning a specific  monitor serial # to you.   Applying the monitor   Shave hair from upper left chest.   Hold abrader disc by orange tab.  Rub abrader in 40 strokes over left upper chest as indicated in your monitor instructions.   Clean area with 4 enclosed alcohol pads .  Use all pads to assure are is cleaned thoroughly.  Let dry.   Apply patch as indicated in monitor instructions.  Patch will  be place under collarbone on left side of chest with arrow pointing upward.   Rub patch adhesive wings for 2 minutes.Remove white label marked "1".  Remove white label marked "2".  Rub patch adhesive wings for 2 additional minutes.   While looking in a mirror, press and release button in center of patch.  A small green light will flash 3-4 times .  This will be your only indicator the monitor has been turned on.     Do not shower for the first 24 hours.  You may shower after the first 24 hours.   Press button if you feel a symptom. You will hear a small click.  Record Date, Time and Symptom in the Patient Log Book.   When you are ready to remove patch, follow instructions on last 2 pages of Patient Log Book.  Stick patch monitor onto last page of Patient Log Book.   Place Patient Log Book in [place] box.  Use locking tab on box and tape box closed securely.  The [employer] has [employer] on it.  Please place in mailbox as soon as possible.  Your physician should have your test results approximately 7 days after the monitor has been mailed back to [facility].   Call [place] at [phone] if you have questions regarding your ZIO XT patch monitor.  Call them immediately if you see an orange light blinking on your monitor.   If your monitor falls off in less than 4 days contact our Monitor department at [phone].  If your monitor becomes loose or falls off after 4 days call [employer] at [phone] for suggestions on securing your monitor.

## 2019-11-27 NOTE — Telephone Encounter (Signed)
Enrolled patient for a 14 day Zio XT  monitor to be mailed to patients home  °

## 2019-11-27 NOTE — Progress Notes (Signed)
Cardiology Office Note:    Date:  11/27/2019   ID:  Maria Bishop, DOB August 22, 1960, MRN [id number]  PCP:  [name], MD   Referring MD: [name], MD   CC: Tachycardia Consulted for the evaluation of SVT at the behest of [name], MD Seen By Dr. Burt Knack   History of Present Illness:    Maria Bishop is a 59 y.o. female with a hx of CAD with unlikely SCAD in 2013 s/p OM3, HLD on Praulent who presents with new tachycardia syndrome.  Patient notes that her insurance company is no longer covering her PCSK9i for secondary prevention. Terrible nausea and myalgias with statins and zetia  Around the first of the year has had elevated heart rate.  Has always had fast heart rates, but with rehab after her knee this is as fast at 135 bpm.  When the tachycardia occurs, she feels harder to breath.  Felt short of breath most of the day.  No chest pain.  No weight gain since beginning of symptoms.  No PND or or othopnea. Notes generalized fatigue.  Recently started adderal.  Past Medical History:  Diagnosis Date   Anxiety    CAD (coronary artery disease)    a. NSTEMI 2013: likely spontaneous dissection/ruptured plaque in OM3 - PCI: 3 overlapping Promus DES to OM3, EF 55% b. cath 2014: patent stents   Cervical cancer ([place])    Cervical cancer   Degenerative disc disease    Depression    GERD (gastroesophageal reflux disease)    Headache    Heart murmur    MRSA infection    Myocardial infarction ([place])    ~22yrs ago   NSTEMI (non-ST elevated myocardial infarction) ([place]) 06/24/2011   a. NSTEMI 2013: likely spontaneous dissection/ruptured plaque in OM3 - PCI: 3 overlapping Promus DES to OM3, EF 55%    Pneumonia    As a child    Past Surgical History:  Procedure Laterality Date   APPENDECTOMY     [place] bunionectomy right  February 2015   CARDIAC CATHETERIZATION N/A 01/26/2016   Procedure: Left Heart Cath and Coronary Angiography;   Surgeon: Belva Crome, MD;  Location: [facility];  Service: Cardiovascular;  Laterality: N/A;   CERVICAL BIOPSY     LEFT HEART CATHETERIZATION WITH CORONARY ANGIOGRAM N/A 06/24/2011   Procedure: LEFT HEART CATHETERIZATION WITH CORONARY ANGIOGRAM;  Surgeon: Jettie Booze, MD;  Location: [facility] CATH [facility];  Service: Cardiovascular;  Laterality: N/A;   PERCUTANEOUS CORONARY STENT INTERVENTION (PCI-S) Right 06/24/2011   Procedure: PERCUTANEOUS CORONARY STENT INTERVENTION (PCI-S);  Surgeon: Jettie Booze, MD;  Location: [facility] CATH LAB;  Service: Cardiovascular;  Laterality: Right;   TOTAL KNEE ARTHROPLASTY Left 10/01/2019   Procedure: LEFT TOTAL KNEE ARTHROPLASTY;  Surgeon: Leandrew Koyanagi, MD;  Location: [facility];  Service: Orthopedics;  Laterality: Left;   TUBAL LIGATION     WISDOM TOOTH EXTRACTION      Current Medications: Current Meds  Medication Sig   ALPRAZolam (XANAX) 0.5 MG tablet TAKE 1 TABLET BY MOUTH 2 TIMES DAILY AS NEEDED   amphetamine-dextroamphetamine (ADDERALL XR) 30 MG 24 hr capsule Take 1 capsule (30 mg total) by mouth every morning.   aspirin EC 81 MG tablet Take 1 tablet (81 mg total) by mouth in the morning and at bedtime.   Cyanocobalamin (VITAMIN B-12 PO) Take 1 tablet by mouth daily.   cyclobenzaprine (FLEXERIL) 10 MG tablet TAKE 1 TABLET BY MOUTH 3 TIMES DAILY AS  NEEDED FOR MUSCLE SPASMS   fexofenadine (ALLEGRA) 180 MG tablet Take 180 mg by mouth 3 (three) times a week. Reported on 06/02/2015   FLUoxetine (PROZAC) 40 MG capsule TAKE 1 CAPSULE BY MOUTH EVERY DAY   lidocaine (XYLOCAINE) 2 % solution Use as directed 5 mLs in the mouth or throat every 6 (six) hours as needed for mouth pain.   nitroGLYCERIN (NITROSTAT) 0.4 MG SL tablet Place 1 tablet (0.4 mg total) under the tongue every 5 (five) minutes as needed.   ondansetron (ZOFRAN) 4 MG tablet Take 1 tablet (4 mg total) by mouth every 8 (eight) hours as needed for nausea or vomiting.   pantoprazole  (PROTONIX) 40 MG tablet TAKE 1 TABLET BY MOUTH EVERY DAY   pregabalin (LYRICA) 75 MG capsule Take 2 capsules (150 mg total) by mouth in the morning and at bedtime.   traMADol (ULTRAM) 50 MG tablet Take 1 tablet (50 mg total) by mouth every 8 (eight) hours as needed (pain.).   Vitamin D3 (VITAMIN D) 25 MCG tablet Take 1,000 Units by mouth daily.   zolpidem (AMBIEN) 10 MG tablet TAKE 1 TABLET BY MOUTH NIGHTLY AT BEDTIME AS NEEDED FOR SLEEP (Patient taking differently: Take 10 mg by mouth at bedtime. )     Allergies:   Cefdinir, Statins, Zetia [ezetimibe], Mobic [meloxicam], and Shellfish allergy   Social History   Socioeconomic History   Marital status: Single    Spouse name: Not on file   Number of children: 2   Years of education: Not on file   Highest education level: Not on file  Occupational History   Occupation: Customer service manager  Tobacco Use   Smoking status: Former Smoker    Types: Cigarettes   Smokeless tobacco: Never Used   Tobacco comment: quit over 30 years ago   Vaping Use   Vaping Use: Never used  Substance and Sexual Activity   Alcohol use: Yes    Alcohol/week: 0.0 standard drinks    Comment: Occasional   Drug use: No   Sexual activity: Not Currently    Birth control/protection: Condom, Surgical, Post-menopausal    Comment: Tubal ligation  Other Topics Concern   Not on file  Social History Narrative   Right handed    Social Determinants of Health   Financial Resource Strain:    Difficulty of Paying Living Expenses: Not on file  Food Insecurity:    Worried About [place] in the Last Year: Not on file   Ran Out of Food in the Last Year: Not on file  Transportation Needs:    Lack of Transportation (Medical): Not on file   Lack of Transportation (Non-Medical): Not on file  Physical Activity:    Days of Exercise per Week: Not on file   Minutes of Exercise per Session: Not on file  Stress:    Feeling of Stress : Not on  file  Social Connections:    Frequency of Communication with Friends and Family: Not on file   Frequency of Social Gatherings with Friends and Family: Not on file   Attends Religious Services: Not on file   Active Member of Clubs or Organizations: Not on file   Attends Archivist Meetings: Not on file   Marital Status: Not on file     Family History: The patient's family history includes Alzheimer's disease in her maternal uncle; Anxiety disorder in her daughter; Asthma in her daughter; Brain cancer in her sister; Breast cancer in her maternal  grandmother; Coronary artery disease in her mother; Drug abuse in her son; Early death in her father; Hypertension in her mother.  ROS:   Please see the history of present illness.    All other systems reviewed and are negative.  EKGs/Labs/Other Studies Reviewed:    The following studies were reviewed today:  EKG:  SR rate 91, borderline criteria for anterior infarct present  Recent Labs: 09/29/2019: ALT 16 10/29/2019: BUN 8; Creatinine, Ser 0.73; Hemoglobin 11.8; Platelets 257.0; Potassium 3.7; Sodium 140; TSH 1.43  Recent Lipid Panel    Component Value Date/Time   CHOL 167 02/05/2019 1516   CHOL 123 06/01/2017 0808   TRIG 128 02/05/2019 1516   HDL 43 (L) 02/05/2019 1516   HDL 38 (L) 06/01/2017 0808   CHOLHDL 3.9 02/05/2019 1516   VLDL 31.0 01/24/2018 1455   LDLCALC 102 (H) 02/05/2019 1516   LHC 2017- Report  Widely patent coronary arteries. Right dominant coronary circulation. No evidence of obstructive coronary disease.  Widely patent overlapping stents from the midportion of the third obtuse marginal into the mid circumflex. No evidence of restenosis.  Low normal LV function with EF 50%. Normal filling pressure  Physical Exam:    VS:  BP 122/64    Pulse (!) 104    Resp 12    Ht 5\' 2"  (1.575 m)    Wt 197 lb (89.4 kg)    LMP 07/05/2012    BMI 36.03 kg/m     Wt Readings from Last 3 Encounters:  11/27/19 197 lb  (89.4 kg)  11/07/19 197 lb 1.6 oz (89.4 kg)  10/29/19 197 lb 4 oz (89.5 kg)     GEN: Well nourished, well developed in no acute distress HEENT: Normal NECK: No JVD; No carotid bruits LYMPHATICS: No lymphadenopathy CARDIAC: RRR, no murmurs, rubs, gallops RESPIRATORY:  Clear to auscultation without rales, wheezing or rhonchi  ABDOMEN: Soft, non-tender, non-distended MUSCULOSKELETAL:  No edema; No deformity  SKIN: Warm and dry NEUROLOGIC:  Alert and oriented x 3 PSYCHIATRIC:  Normal affect   ASSESSMENT:    1. SVT (supraventricular tachycardia) ([place])   2. Anemia, unspecified type   3. Pure hypercholesterolemia   4. Coronary artery disease involving native coronary artery of native heart without angina pectoris    PLAN:    In order of problems listed above:  Suprventricular Tachycardia vs inappropriate sinus Microcyctic Anemia - Ziopatch- 14 day non- live - will obtain TTE. - will get CBC  Coronary Artery Disease; Obstructive- possible SCAD HLD - asymptomatic  - anatomy: 1 v CAD - continue ASA 81 mg - unable to tolerate Zetia or statin; will see what resources we have to assist with PCSKI - Will resend PCKSI inhibitor; if we cannot help with cost will reach out to lipid clinic  Will see in 3-4 months to specifically address SCAD risks/benefits of further therapy unless new symptoms or abnormal test results warranting change in plan  Would be reasonable for  Virtual Follow up Would be reasonable for  APP Follow up   Medication Adjustments/Labs and Tests Ordered: Current medicines are reviewed at length with the patient today.  Concerns regarding medicines are outlined above.  No orders of the defined types were placed in this encounter.  No orders of the defined types were placed in this encounter.   There are no Patient Instructions on file for this visit.   Signed, Werner Lean, MD  11/27/2019 8:35 AM    [place]

## 2019-11-28 ENCOUNTER — Other Ambulatory Visit: Payer: Self-pay

## 2019-11-28 ENCOUNTER — Telehealth: Payer: Self-pay | Admitting: Cardiovascular Disease

## 2019-11-28 DIAGNOSIS — E7800 Pure hypercholesterolemia, unspecified: Secondary | ICD-10-CM

## 2019-11-28 MED ORDER — REPATHA SURECLICK 140 MG/ML ~~LOC~~ SOAJ: 140.0000 mg | SUBCUTANEOUS | 11 refills | Status: DC

## 2019-11-28 NOTE — Telephone Encounter (Signed)
Called and lmomed the pt to switch from praluent to repatha due to her insurance unable to approve the praluent, [facility], pt instructed to apply for a copay card and to complete fasting lipid labs after 4th dose. Orders were placed. Also told the pt to call us if they have any questions

## 2019-11-28 NOTE — Telephone Encounter (Signed)
    Pt c/o medication issue:  1. Name of Medication: Alirocumab ([place]) 75 MG/ML SOAJ  2. How are you currently taking this medication (dosage and times per day)? Inject 1 pen into the skin every 14 (fourteen) days.Patient not taking: Reported on 11/27/2019  3. Are you having a reaction (difficulty breathing--STAT)?   4. What is your medication issue? [facility] called, they said they faxed prior authorization request Praluent  And they are following up.

## 2019-12-02 ENCOUNTER — Other Ambulatory Visit (INDEPENDENT_AMBULATORY_CARE_PROVIDER_SITE_OTHER): Payer: 59

## 2019-12-02 DIAGNOSIS — I471 Supraventricular tachycardia, unspecified: Secondary | ICD-10-CM

## 2019-12-07 ENCOUNTER — Encounter: Payer: Self-pay | Admitting: Family Medicine

## 2019-12-12 ENCOUNTER — Other Ambulatory Visit (HOSPITAL_COMMUNITY): Payer: 59

## 2019-12-17 ENCOUNTER — Other Ambulatory Visit: Payer: Self-pay | Admitting: Family Medicine

## 2019-12-18 ENCOUNTER — Other Ambulatory Visit: Payer: Self-pay | Admitting: Family Medicine

## 2019-12-18 MED ORDER — AMPHETAMINE-DEXTROAMPHET ER 30 MG PO CP24
30.0000 mg | ORAL_CAPSULE | ORAL | 0 refills | Status: DC
Start: 2019-12-18 — End: 2020-01-18

## 2019-12-18 NOTE — Telephone Encounter (Signed)
Last OV 11/17/19 Last refill 11/17/19      30 caps with 0 refill

## 2019-12-18 NOTE — Telephone Encounter (Signed)
Patient would like the following  Flexeril 10mg   30 tabs 0 refills  Last refill 11/17/2019

## 2019-12-26 ENCOUNTER — Encounter: Payer: Self-pay | Admitting: Orthopaedic Surgery

## 2019-12-26 ENCOUNTER — Other Ambulatory Visit: Payer: Self-pay

## 2019-12-26 ENCOUNTER — Ambulatory Visit (INDEPENDENT_AMBULATORY_CARE_PROVIDER_SITE_OTHER): Payer: 59 | Admitting: Orthopaedic Surgery

## 2019-12-26 ENCOUNTER — Ambulatory Visit (INDEPENDENT_AMBULATORY_CARE_PROVIDER_SITE_OTHER): Payer: 59

## 2019-12-26 DIAGNOSIS — Z96652 Presence of left artificial knee joint: Secondary | ICD-10-CM

## 2019-12-26 NOTE — Progress Notes (Signed)
Post-Op Visit Note   Patient: Maria Bishop           Date of Birth: 22-Nov-1960           MRN: [id number] Visit Date: 12/26/2019 PCP: [name], MD   Assessment & Plan:  Chief Complaint:  Chief Complaint  Patient presents with  . Left Knee - Pain   Visit Diagnoses:  1. Status post total left knee replacement     Plan:  Patient is approximately 27-month status post left total knee replacement.  She is doing well and has no complaints.  Feels a little bit of a rubbing sensation on the anterior lateral aspect of the knee towards the end of the day.  This does not cause any pain.  Denies any swelling.  Surgical scar is fully healed.  Excellent range of motion.  Stable to varus valgus.  X-rays demonstrate stable total knee replacement without complications.  She is doing very well for her 71-month visit.  Dental prophylaxis reinforced.  Continue to increase activity as tolerated.  She is return back to her job.  Recheck in 9 months with two-view x-rays of the left knee.  Follow-Up Instructions: Return in about 9 months (around 09/24/2020).   Orders:  Orders Placed This Encounter  Procedures  . XR KNEE 3 VIEW LEFT   No orders of the defined types were placed in this encounter.   Imaging: XR KNEE 3 VIEW LEFT  Result Date: 12/26/2019 Stable total knee replacement without complication   PMFS History: Patient Active Problem List   Diagnosis Date Noted  . SVT (supraventricular tachycardia) ([place]) 11/27/2019  . Anemia 11/27/2019  . Pure hypercholesterolemia 11/27/2019  . Tachycardia 10/29/2019  . Status post total left knee replacement 10/01/2019  . Binge eating disorder 08/20/2019  . Primary osteoarthritis of left knee 07/30/2019  . Morbid obesity ([place]) 03/10/2019  . Primary osteoarthritis of both knees 03/27/2018  . Primary osteoarthritis of both hands 03/27/2018  . DDD (degenerative disc disease), lumbar 03/27/2018  . DDD (degenerative disc disease),  cervical 03/27/2018  . Vitamin D deficiency 01/24/2018  . Chest pain 01/25/2016  . Lumbar radiculopathy 08/06/2015  . Memory loss 12/02/2014  . History of cervical cancer 12/02/2014  . History of MI (myocardial infarction) 12/02/2014  . Insomnia 08/31/2014  . GERD (gastroesophageal reflux disease) 07/08/2013  . Abdominal pain, epigastric 07/08/2013  . Screening for malignant neoplasm of cervix 07/19/2012  . Routine general medical examination at a health care facility 07/19/2012  . Depression with anxiety 06/06/2012  . Hyperlipidemia 09/06/2011  . Coronary artery disease involving native coronary artery of native heart without angina pectoris 06/24/2001   Past Medical History:  Diagnosis Date  . Anxiety   . CAD (coronary artery disease)    a. NSTEMI 2013: likely spontaneous dissection/ruptured plaque in OM3 - PCI: 3 overlapping Promus DES to OM3, EF 55% b. cath 2014: patent stents  . Cervical cancer ([facility])    Cervical cancer  . Degenerative disc disease   . Depression   . GERD (gastroesophageal reflux disease)   . Headache   . Heart murmur   . MRSA infection   . Myocardial infarction ([place])    ~79yrs ago  . NSTEMI (non-ST elevated myocardial infarction) ([place]) 06/24/2011   a. NSTEMI 2013: likely spontaneous dissection/ruptured plaque in OM3 - PCI: 3 overlapping Promus DES to OM3, EF 55%   . Pneumonia    As a child    Family History  Problem Relation Age of  Onset  . Alzheimer's disease Maternal Uncle   . Coronary artery disease Mother        MI in her 4s  . Hypertension Mother   . Early death Father   . Brain cancer Sister   . Drug abuse Son   . Asthma Daughter   . Anxiety disorder Daughter   . Breast cancer Maternal Grandmother     Past Surgical History:  Procedure Laterality Date  . APPENDECTOMY    . [place] bunionectomy right  February 2015  . CARDIAC CATHETERIZATION N/A 01/26/2016   Procedure: Left Heart Cath and Coronary Angiography;  Surgeon: Belva Crome, MD;   Location: [place] [facility];  Service: Cardiovascular;  Laterality: N/A;  . CERVICAL BIOPSY    . LEFT HEART CATHETERIZATION WITH CORONARY ANGIOGRAM N/A 06/24/2011   Procedure: LEFT HEART CATHETERIZATION WITH CORONARY ANGIOGRAM;  Surgeon: Jettie Booze, MD;  Location: [facility] CATH LAB;  Service: Cardiovascular;  Laterality: N/A;  . PERCUTANEOUS CORONARY STENT INTERVENTION (PCI-S) Right 06/24/2011   Procedure: PERCUTANEOUS CORONARY STENT INTERVENTION (PCI-S);  Surgeon: Jettie Booze, MD;  Location: [facility] CATH LAB;  Service: Cardiovascular;  Laterality: Right;  . TOTAL KNEE ARTHROPLASTY Left 10/01/2019   Procedure: LEFT TOTAL KNEE ARTHROPLASTY;  Surgeon: Leandrew Koyanagi, MD;  [facility];  Service: Orthopedics;  Laterality: Left;  . TUBAL LIGATION    . WISDOM TOOTH EXTRACTION     Social History   Occupational History  . Occupation: Customer service manager  Tobacco Use  . Smoking status: Former Smoker    Types: Cigarettes  . Smokeless tobacco: Never Used  . Tobacco comment: quit over 30 years ago   Vaping Use  . Vaping Use: Never used  Substance and Sexual Activity  . Alcohol use: Yes    Alcohol/week: 0.0 standard drinks    Comment: Occasional  . Drug use: No  . Sexual activity: Not Currently    Birth control/protection: Condom, Surgical, Post-menopausal    Comment: Tubal ligation

## 2019-12-29 ENCOUNTER — Telehealth: Payer: Self-pay | Admitting: Internal Medicine

## 2019-12-29 NOTE — Telephone Encounter (Signed)
Left the pt a message to call back and request to speak with any triage nurse, to receive monitor results and recommendations per Dr. Gasper Sells.       LONG TERM MONITOR (3-14 DAYS): Result Notes  Werner Lean, MD  12/28/2019 4:59 PM EST     Results: Short runs of PACs and PVCs, occasional SVT (not symptom triggered) Plan: Offering metoprolol tartrate 12.5 mg PO BID  Werner Lean, MD

## 2019-12-29 NOTE — Telephone Encounter (Signed)
Patient states she received a mychart message about her monitor results. She would like to know if she is going to be put on any medications and if so, what they are.

## 2019-12-30 MED ORDER — METOPROLOL TARTRATE 25 MG PO TABS: 12.5000 mg | ORAL_TABLET | Freq: Two times a day (BID) | ORAL | 3 refills | Status: DC

## 2019-12-30 NOTE — Telephone Encounter (Signed)
Pt verbalized understanding of her monitor report and agrees to try the metoprolol and will keep her appt for her Echo 01/06/20.

## 2020-01-06 ENCOUNTER — Other Ambulatory Visit: Payer: Self-pay

## 2020-01-06 ENCOUNTER — Ambulatory Visit (HOSPITAL_COMMUNITY): Payer: 59 | Attending: Cardiology

## 2020-01-06 DIAGNOSIS — I471 Supraventricular tachycardia: Secondary | ICD-10-CM | POA: Diagnosis not present

## 2020-01-06 DIAGNOSIS — I2510 Atherosclerotic heart disease of native coronary artery without angina pectoris: Secondary | ICD-10-CM

## 2020-01-06 LAB — ECHOCARDIOGRAM COMPLETE
Area-P 1/2: 4.89 cm2
S' Lateral: 2.8 cm

## 2020-01-17 ENCOUNTER — Other Ambulatory Visit: Payer: Self-pay | Admitting: Family Medicine

## 2020-01-18 ENCOUNTER — Other Ambulatory Visit: Payer: Self-pay | Admitting: Family Medicine

## 2020-01-19 ENCOUNTER — Other Ambulatory Visit: Payer: Self-pay | Admitting: Family Medicine

## 2020-01-19 MED ORDER — TRAMADOL HCL 50 MG PO TABS: 50.0000 mg | ORAL_TABLET | Freq: Three times a day (TID) | ORAL | 0 refills | Status: DC | PRN

## 2020-01-19 MED ORDER — CYCLOBENZAPRINE HCL 10 MG PO TABS: 10.0000 mg | ORAL_TABLET | Freq: Three times a day (TID) | ORAL | 0 refills | Status: DC | PRN

## 2020-01-19 MED ORDER — AMPHETAMINE-DEXTROAMPHET ER 30 MG PO CP24
30.0000 mg | ORAL_CAPSULE | ORAL | 0 refills | Status: DC
Start: 2020-01-19 — End: 2020-02-24

## 2020-01-19 NOTE — Telephone Encounter (Signed)
Please advise 

## 2020-01-22 ENCOUNTER — Encounter: Payer: Self-pay | Admitting: Family Medicine

## 2020-01-22 ENCOUNTER — Telehealth (INDEPENDENT_AMBULATORY_CARE_PROVIDER_SITE_OTHER): Payer: 59 | Admitting: Family Medicine

## 2020-01-22 DIAGNOSIS — J069 Acute upper respiratory infection, unspecified: Secondary | ICD-10-CM | POA: Diagnosis not present

## 2020-01-22 MED ORDER — ALBUTEROL SULFATE HFA 108 (90 BASE) MCG/ACT IN AERS: 2.0000 | INHALATION_SPRAY | RESPIRATORY_TRACT | 0 refills | Status: AC | PRN

## 2020-01-22 MED ORDER — GUAIFENESIN-CODEINE 100-10 MG/5ML PO SYRP
10.0000 mL | ORAL_SOLUTION | Freq: Three times a day (TID) | ORAL | 0 refills | Status: DC | PRN
Start: 2020-01-22 — End: 2020-06-28

## 2020-01-22 NOTE — Progress Notes (Signed)
Virtual Visit via Video   I connected with patient on 01/22/20 at  1:30 PM EST by a video enabled telemedicine application and verified that I am speaking with the correct person using two identifiers.  Location patient: Home Location provider: [facility], Office Persons participating in the virtual visit: Patient, Provider, [place] (Sabrina M)  I discussed the limitations of evaluation and management by telemedicine and the availability of in person appointments. The patient expressed understanding and agreed to proceed.  Subjective:   HPI:   URI- pt reports sxs started Monday/Tuesday w/ congestion.  Taking Mucinex w/ some relief.  But now cough is productive of thick sputum- white/clear sputum.  No fevers.  Some sinus pressure.  + ear pain, R>L.  Friend was recently sick w/ same sxs.  She was negative for COVID.  + fatigue.  Cough will come in fits and when that happens she will get SOB and chest tightness.  ROS:   See pertinent positives and negatives per HPI.  Patient Active Problem List   Diagnosis Date Noted  . SVT (supraventricular tachycardia) ([place]) 11/27/2019  . Anemia 11/27/2019  . Pure hypercholesterolemia 11/27/2019  . Tachycardia 10/29/2019  . Status post total left knee replacement 10/01/2019  . Binge eating disorder 08/20/2019  . Primary osteoarthritis of left knee 07/30/2019  . Morbid obesity ([place]) 03/10/2019  . Primary osteoarthritis of both knees 03/27/2018  . Primary osteoarthritis of both hands 03/27/2018  . DDD (degenerative disc disease), lumbar 03/27/2018  . DDD (degenerative disc disease), cervical 03/27/2018  . Vitamin D deficiency 01/24/2018  . Chest pain 01/25/2016  . Lumbar radiculopathy 08/06/2015  . Memory loss 12/02/2014  . History of cervical cancer 12/02/2014  . History of MI (myocardial infarction) 12/02/2014  . Insomnia 08/31/2014  . GERD (gastroesophageal reflux disease) 07/08/2013  . Abdominal pain, epigastric 07/08/2013  .  Screening for malignant neoplasm of cervix 07/19/2012  . Routine general medical examination at a health care facility 07/19/2012  . Depression with anxiety 06/06/2012  . Hyperlipidemia 09/06/2011  . Coronary artery disease involving native coronary artery of native heart without angina pectoris 06/24/2001    Social History   Tobacco Use  . Smoking status: Former Smoker    Types: Cigarettes  . Smokeless tobacco: Never Used  . Tobacco comment: quit over 30 years ago   Substance Use Topics  . Alcohol use: Yes    Alcohol/week: 0.0 standard drinks    Comment: Occasional    Current Outpatient Medications:  .  ALPRAZolam (XANAX) 0.5 MG tablet, TAKE 1 TABLET BY MOUTH 2 TIMES DAILY AS NEEDED, Disp: 30 tablet, Rfl: 3 .  amphetamine-dextroamphetamine (ADDERALL XR) 30 MG 24 hr capsule, Take 1 capsule (30 mg total) by mouth every morning., Disp: 30 capsule, Rfl: 0 .  aspirin EC 81 MG tablet, Take 1 tablet (81 mg total) by mouth in the morning and at bedtime., Disp: 84 tablet, Rfl: 0 .  COVID-19 Specimen Collection KIT, See admin instructions. for testing, Disp: , Rfl:  .  Cyanocobalamin (VITAMIN B-12 PO), Take 1 tablet by mouth daily., Disp: , Rfl:  .  cyclobenzaprine (FLEXERIL) 10 MG tablet, TAKE 1 TABLET BY MOUTH 3 TIMES DAILY AS NEEDED FOR MUSCLE SPASMS, Disp: 30 tablet, Rfl: 0 .  cyclobenzaprine (FLEXERIL) 10 MG tablet, Take 1 tablet (10 mg total) by mouth 3 (three) times daily as needed for muscle spasms., Disp: 30 tablet, Rfl: 0 .  Evolocumab (REPATHA SURECLICK) 320 MG/ML SOAJ, Inject 140 mg into the skin  every 14 (fourteen) days., Disp: 2 mL, Rfl: 11 .  fexofenadine (ALLEGRA) 180 MG tablet, Take 180 mg by mouth 3 (three) times a week. Reported on 06/02/2015, Disp: , Rfl:  .  FLUoxetine (PROZAC) 40 MG capsule, TAKE 1 CAPSULE BY MOUTH EVERY DAY, Disp: 90 capsule, Rfl: 0 .  metoprolol tartrate (LOPRESSOR) 25 MG tablet, Take 0.5 tablets (12.5 mg total) by mouth 2 (two) times daily., Disp: 90  tablet, Rfl: 3 .  pantoprazole (PROTONIX) 40 MG tablet, TAKE 1 TABLET BY MOUTH EVERY DAY, Disp: 90 tablet, Rfl: 0 .  traMADol (ULTRAM) 50 MG tablet, Take 1 tablet (50 mg total) by mouth every 8 (eight) hours as needed., Disp: 30 tablet, Rfl: 0 .  Vitamin D3 (VITAMIN D) 25 MCG tablet, Take 1,000 Units by mouth daily., Disp: , Rfl:  .  zolpidem (AMBIEN) 10 MG tablet, TAKE 1 TABLET BY MOUTH AT BEDTIME AS NEEDED FOR SLEEP, Disp: 30 tablet, Rfl: 3 .  lidocaine (XYLOCAINE) 2 % solution, Use as directed 5 mLs in the mouth or throat every 6 (six) hours as needed for mouth pain. (Patient not taking: Reported on 01/22/2020), Disp: 100 mL, Rfl: 0 .  nitroGLYCERIN (NITROSTAT) 0.4 MG SL tablet, Place 1 tablet (0.4 mg total) under the tongue every 5 (five) minutes as needed. (Patient not taking: Reported on 01/22/2020), Disp: 25 tablet, Rfl: 3 .  ondansetron (ZOFRAN) 4 MG tablet, Take 1 tablet (4 mg total) by mouth every 8 (eight) hours as needed for nausea or vomiting. (Patient not taking: Reported on 01/22/2020), Disp: 40 tablet, Rfl: 0 .  pregabalin (LYRICA) 75 MG capsule, Take 2 capsules (150 mg total) by mouth in the morning and at bedtime. (Patient not taking: Reported on 01/22/2020), Disp: 120 capsule, Rfl: 3  Allergies  Allergen Reactions  . Cefdinir Swelling    Gums, tongue -- mild  . Statins     Failed Crestor 5 mg qd, Simvastatin 40 mg, and Lipitor 40 mg, and pravastatin 10 mg qd due to muscle and joint pain  . Zetia [Ezetimibe]     Depression and constipation per patient  . Mobic [Meloxicam] Rash  . Shellfish Allergy Rash    Childhood reaction    Objective:   LMP 07/05/2012  AAOx3, NAD NCAT, EOMI No obvious CN deficits + laryngitis w/ obvious nasal congestion Pt is able to speak clearly, coherently without shortness of breath or increased work of breathing.  Thought process is linear.  Mood is appropriate.   Assessment and Plan:   Viral URI- new.  Pt has sick contact w/ similar sxs.   Reviewed that with viral illness abx will not be effective.  Continue supportive care.  Add Albuterol inhaler and cough syrup.  Reviewed red flags that should prompt return.  Pt expressed understanding and is in agreement w/ plan.   Annye Asa, MD 01/22/2020

## 2020-01-22 NOTE — Progress Notes (Signed)
I connected with  Maria Bishop on 01/22/20 by a video enabled telemedicine application and verified that I am speaking with the correct person using two identifiers.   I discussed the limitations of evaluation and management by telemedicine. The patient expressed understanding and agreed to proceed.

## 2020-01-23 ENCOUNTER — Telehealth: Payer: Self-pay

## 2020-01-23 NOTE — Telephone Encounter (Signed)
Spoke with [facility] in regards to a prior auth for Ventolin HFA.. Insurance would not cover a specific amount and pharmacy tech put in another amount for it to be covered by insurance. Spoke with Patient to let her know and pt understood. Rx filled.

## 2020-01-30 ENCOUNTER — Other Ambulatory Visit: Payer: Self-pay | Admitting: Family Medicine

## 2020-02-11 ENCOUNTER — Other Ambulatory Visit: Payer: Self-pay | Admitting: Family Medicine

## 2020-02-12 NOTE — Telephone Encounter (Signed)
Xanax last rx 10/20/19 #30 3 RF LOV: 01/22/20 URI No upcoming appointment

## 2020-02-24 ENCOUNTER — Other Ambulatory Visit: Payer: Self-pay | Admitting: Family Medicine

## 2020-02-24 NOTE — Telephone Encounter (Signed)
Requesting:Adderall XR 30mg  24hr Contract: UDS: Last Visit:01/22/20 video visit Next Visit:n/a Last Refill:01/19/20  Please Advise

## 2020-02-25 ENCOUNTER — Other Ambulatory Visit: Payer: Self-pay | Admitting: Family Medicine

## 2020-02-25 NOTE — Telephone Encounter (Signed)
Requesting:Adderall XR 30mg  Contract: UDS: Last Visit:01/22/20 Next Visit:n/a Last Refill:01/19/20  Please Advise This maybe a duplicate

## 2020-02-26 MED ORDER — AMPHETAMINE-DEXTROAMPHET ER 30 MG PO CP24: 30.0000 mg | ORAL_CAPSULE | ORAL | 0 refills | Status: DC

## 2020-02-27 ENCOUNTER — Encounter: Payer: Self-pay | Admitting: Family Medicine

## 2020-03-01 ENCOUNTER — Ambulatory Visit (INDEPENDENT_AMBULATORY_CARE_PROVIDER_SITE_OTHER): Payer: 59 | Admitting: Internal Medicine

## 2020-03-01 ENCOUNTER — Encounter: Payer: Self-pay | Admitting: Internal Medicine

## 2020-03-01 ENCOUNTER — Other Ambulatory Visit: Payer: Self-pay

## 2020-03-01 VITALS — BP 102/70 | HR 82 | Ht 62.0 in | Wt 200.0 lb

## 2020-03-01 DIAGNOSIS — E7800 Pure hypercholesterolemia, unspecified: Secondary | ICD-10-CM | POA: Diagnosis not present

## 2020-03-01 DIAGNOSIS — I471 Supraventricular tachycardia: Secondary | ICD-10-CM | POA: Diagnosis not present

## 2020-03-01 DIAGNOSIS — I2510 Atherosclerotic heart disease of native coronary artery without angina pectoris: Secondary | ICD-10-CM

## 2020-03-01 DIAGNOSIS — I2542 Coronary artery dissection: Secondary | ICD-10-CM | POA: Diagnosis not present

## 2020-03-01 NOTE — Patient Instructions (Signed)
Medication Instructions:  Discontinue Metoprolol   *If you need a refill on your cardiac medications before your next appointment, please call your pharmacy*   Lab Work: None ordered   If you have labs (blood work) drawn today and your tests are completely normal, you will receive your results only by: Marland Kitchen MyChart Message (if you have MyChart) OR . A paper copy in the mail If you have any lab test that is abnormal or we need to change your treatment, we will call you to review the results.   Testing/Procedures: None ordered    Follow-Up: At [facility], you and your health needs are our priority.  As part of our continuing mission to provide you with exceptional heart care, we have created designated Provider Care Teams.  These Care Teams include your primary Cardiologist (physician) and Advanced Practice Providers (APPs -  Physician Assistants and Nurse Practitioners) who all work together to provide you with the care you need, when you need it.  We recommend signing up for the patient portal called "MyChart".  Sign up information is provided on this After Visit Summary.  MyChart is used to connect with patients for Virtual Visits (Telemedicine).  Patients are able to view lab/test results, encounter notes, upcoming appointments, etc.  Non-urgent messages can be sent to your provider as well.   To learn more about what you can do with MyChart, go to [email or web address].    Your next appointment:   6 month(s)  The format for your next appointment:   In Person  Provider:   You may see Werner Lean, MD or one of the following Advanced Practice Providers on your designated Care Team:    Melina Copa, PA-C  Ermalinda Barrios, PA-C    Other Instructions None

## 2020-03-01 NOTE — Progress Notes (Signed)
Cardiology Office Note:    Date:  03/01/2020   ID:  Maria Bishop, DOB September 15, 1960, MRN [id number]  PCP:  [name], MD   Referring MD: [name], MD   CC: follow up SVT  History of Present Illness:    Maria Bishop is a 60 y.o. female with a hx of CAD with unlikely SCAD in 2013 s/p OM3, HLD on Praulent who presented with SVT 11/27/19.  In interval found to have occasional SVT without triggered symptoms on monitor; trial of low dose BB started.  Patient notes that she is doing ok, but feels tired and sluggish since starting the metoprolol.  Since last visit notes that she had some of those funny heart beats that come and go.  With the metoprolol, her heart rate is in the 80s.  Notes that she had had dry mouth and dry eyes and has new cyst on her eye.    No chest pain or pressure.  No SOB/DOE and no PND/Orthopnea.  No weight gain or leg swelling.  Still notes palpitations without improvement; no syncope.   Past Medical History:  Diagnosis Date  . Anxiety   . CAD (coronary artery disease)    a. NSTEMI 2013: likely spontaneous dissection/ruptured plaque in OM3 - PCI: 3 overlapping Promus DES to OM3, EF 55% b. cath 2014: patent stents  . Cervical cancer (HCC)    Cervical cancer  . Degenerative disc disease   . Depression   . GERD (gastroesophageal reflux disease)   . Headache   . Heart murmur   . MRSA infection   . Myocardial infarction ([place])    ~16yr ago  . NSTEMI (non-ST elevated myocardial infarction) [place] 06/24[place]   a. NSTEMI 2013: likely spontaneous dissection/ruptured plaque in OM3 - PCI: 3 overlapping Promus DES to OM3, EF 55%   . Pneumonia    As a child    Past Surgical History:  Procedure Laterality Date  . APPENDECTOMY    . [place] bunionectomy right  February 2015  . CARDIAC CATHETERIZATION N/A 01/26/2016   Procedure: Left Heart Cath and Coronary Angiography;  Surgeon: HBelva Crome MD;  Location: [facility];  Service:  Cardiovascular;  Laterality: N/A;  . CERVICAL BIOPSY    . JOINT REPLACEMENT  09/2019   total left knee replacement  . LEFT HEART CATHETERIZATION WITH CORONARY ANGIOGRAM N/A 06/24/2011   Procedure: LEFT HEART CATHETERIZATION WITH CORONARY ANGIOGRAM;  Surgeon: JJettie Booze MD;  [facility];  Service: Cardiovascular;  Laterality: N/A;  . PERCUTANEOUS CORONARY STENT INTERVENTION (PCI-S) Right 06/24/2011   Procedure: PERCUTANEOUS CORONARY STENT INTERVENTION (PCI-S);  Surgeon: JJettie Booze MD;  [facility];  Service: Cardiovascular;  Laterality: Right;  . TOTAL KNEE ARTHROPLASTY Left 10/01/2019   Procedure: LEFT TOTAL KNEE ARTHROPLASTY;  Surgeon: XLeandrew Koyanagi MD;  Location: [facility];  Service: Orthopedics;  Laterality: Left;  . TUBAL LIGATION    . WISDOM TOOTH EXTRACTION      Current Medications: Current Meds  Medication Sig  . albuterol (VENTOLIN HFA) 108 (90 Base) MCG/ACT inhaler Inhale 2 puffs into the lungs every 4 (four) hours as needed for wheezing or shortness of breath.  . ALPRAZolam (XANAX) 0.5 MG tablet TAKE 1 TABLET BY MOUTH 2 TIMES DAILY AS NEEDED  . amphetamine-dextroamphetamine (ADDERALL XR) 30 MG 24 hr capsule Take 1 capsule (30 mg total) by mouth every morning.  .Marland Kitchenaspirin 81 MG EC tablet Take 81 mg by mouth daily. Swallow  whole.  Marland Kitchen COVID-19 Specimen Collection KIT See admin instructions. for testing  . Cyanocobalamin (VITAMIN B-12 PO) Take 1 tablet by mouth daily.  . cyclobenzaprine (FLEXERIL) 10 MG tablet TAKE 1 TABLET BY MOUTH 3 TIMES DAILY AS NEEDED FOR MUSCLE SPASMS  . Evolocumab (REPATHA SURECLICK) 665 MG/ML SOAJ Inject 140 mg into the skin every 14 (fourteen) days.  . fexofenadine (ALLEGRA) 180 MG tablet Take 180 mg by mouth 3 (three) times a week. Reported on 06/02/2015  . FLUoxetine (PROZAC) 40 MG capsule TAKE 1 CAPSULE BY MOUTH EVERY DAY  . guaiFENesin-codeine (ROBITUSSIN AC) 100-10 MG/5ML syrup Take 10 mLs by mouth 3 (three) times daily  as needed for cough.  . lidocaine (XYLOCAINE) 2 % solution Use as directed 5 mLs in the mouth or throat every 6 (six) hours as needed for mouth pain.  Marland Kitchen loteprednol (LOTEMAX) 0.5 % ophthalmic suspension Place 1 drop into the left eye 4 (four) times daily.  Marland Kitchen neomycin-polymyxin b-dexamethasone (MAXITROL) 3.5-10000-0.1 SUSP Place 1 drop into the left eye 4 (four) times daily.  . nitroGLYCERIN (NITROSTAT) 0.4 MG SL tablet Place 1 tablet (0.4 mg total) under the tongue every 5 (five) minutes as needed.  . ondansetron (ZOFRAN) 4 MG tablet Take 1 tablet (4 mg total) by mouth every 8 (eight) hours as needed for nausea or vomiting.  . pantoprazole (PROTONIX) 40 MG tablet TAKE 1 TABLET BY MOUTH EVERY DAY  . pregabalin (LYRICA) 75 MG capsule Take 2 capsules (150 mg total) by mouth in the morning and at bedtime.  . traMADol (ULTRAM) 50 MG tablet Take 1 tablet (50 mg total) by mouth every 8 (eight) hours as needed.  . Vitamin D3 (VITAMIN D) 25 MCG tablet Take 1,000 Units by mouth daily.  Marland Kitchen zolpidem (AMBIEN) 10 MG tablet TAKE 1 TABLET BY MOUTH AT BEDTIME AS NEEDED FOR SLEEP  . [DISCONTINUED] metoprolol tartrate (LOPRESSOR) 25 MG tablet Take 0.5 tablets (12.5 mg total) by mouth 2 (two) times daily.     Allergies:   Cefdinir, Statins, Zetia [ezetimibe], Mobic [meloxicam], and Shellfish allergy   Social History   Socioeconomic History  . Marital status: Single    Spouse name: Not on file  . Number of children: 2  . Years of education: Not on file  . Highest education level: Not on file  Occupational History  . Occupation: Customer service manager  Tobacco Use  . Smoking status: Former Smoker    Types: Cigarettes  . Smokeless tobacco: Never Used  . Tobacco comment: quit over 30 years ago   Vaping Use  . Vaping Use: Never used  Substance and Sexual Activity  . Alcohol use: Yes    Alcohol/week: 0.0 standard drinks    Comment: Occasional  . Drug use: No  . Sexual activity: Not Currently    Birth  control/protection: Condom, Surgical, Post-menopausal    Comment: Tubal ligation  Other Topics Concern  . Not on file  Social History Narrative   Right handed    Social Determinants of Health   Financial Resource Strain: Not on file  Food Insecurity: Not on file  Transportation Needs: Not on file  Physical Activity: Not on file  Stress: Not on file  Social Connections: Not on file    Social:  Will have a joke ready for her at next visit.  Family History: The patient's family history includes Alzheimer's disease in her maternal uncle; Anxiety disorder in her daughter; Asthma in her daughter; Brain cancer in her sister; Breast cancer in  her maternal grandmother; Coronary artery disease in her mother; Drug abuse in her son; Early death in her father; Hypertension in her mother. History of coronary artery disease notable for mother. History of heart failure notable for no members. History of arrhythmia notable for in aunt.  ROS:   Please see the history of present illness.    All other systems reviewed and are negative.  EKGs/Labs/Other Studies Reviewed:    The following studies were reviewed today:  EKG:  11/27/19: SR rate 91, borderline criteria for anterior infarct present  Cardiac Event Monitoring: Date: 12/26/2019 Personally Reviewed Results:  Patient had a min HR of 71 bpm, max HR of 210 bpm, and avg HR of 101 bpm.  Predominant underlying rhythm was sinus rhythm.  Twenty runs of supraventricular tachycardia occurred with the longest lasting 18 beats and with the fastest with a max rate of 210 bpm.  Isolated PACs were rare (<1.0%), with rare couplets and triplets present.  Isolated PVCs were rare (<1.0%), with rare couplets.  No evidence of complete heart block.  Triggered and diary events associated with sinus rhythm, PACs, and PVCs.   Rare, symptomatic PACs and PVCs. Rare, asymptomatic runs of SVT.  Transthoracic Echocardiogram: Date: 01/06/20 Results:  normal BiV function Mild MR IMPRESSIONS  1. Left ventricular ejection fraction, by estimation, is 65 to 70%. The  left ventricle has normal function. The left ventricle has no regional  wall motion abnormalities. Left ventricular diastolic parameters are  consistent with Grade I diastolic  dysfunction (impaired relaxation).  2. Right ventricular systolic function is normal. The right ventricular  size is normal.  3. Left atrial size was mildly dilated.  4. The mitral valve is normal in structure. Mild mitral valve  regurgitation. No evidence of mitral stenosis.  5. The aortic valve is normal in structure. Aortic valve regurgitation is  not visualized. No aortic stenosis is present.  6. The inferior vena cava is normal in size with greater than 50%  respiratory variability, suggesting right atrial pressure of 3 mmHg.   Left/Right Heart Catheterizations: Date: 01/26/16 Results:  Widely patent coronary arteries. Right dominant coronary circulation. No evidence of obstructive coronary disease.  Widely patent overlapping stents from the midportion of the third obtuse marginal into the mid circumflex. No evidence of restenosis.  Low normal LV function with EF 50%. Normal filling pressure  Recent Labs: 09/29/2019: ALT 16 10/29/2019: BUN 8; Creatinine, Ser 0.73; Potassium 3.7; Sodium 140; TSH 1.43 11/27/2019: Hemoglobin 13.0; Platelets 273  Recent Lipid Panel    Component Value Date/Time   CHOL 167 02/05/2019 1516   CHOL 123 06/01/2017 0808   TRIG 128 02/05/2019 1516   HDL 43 (L) 02/05/2019 1516   HDL 38 (L) 06/01/2017 0808   CHOLHDL 3.9 02/05/2019 1516   VLDL 31.0 01/24/2018 1455   LDLCALC 102 (H) 02/05/2019 1516    Physical Exam:    VS:  BP 102/70   Pulse 82   Ht _0  (1.575 m)   Wt 200 lb (90.7 kg)   LMP 07/05/2012   SpO2 98%   BMI 36.58 kg/m     Wt Readings from Last 3 Encounters:  03/01/20 200 lb (90.7 kg)  11/27/19 197 lb (89.4 kg)  11/07/19 197 lb 1.6 oz  (89.4 kg)    GEN: Well nourished, well developed in no acute distress HEENT: Normal NECK: No JVD; No carotid bruits LYMPHATICS: No lymphadenopathy CARDIAC: RRR, no murmurs, rubs, gallops RESPIRATORY:  Clear to auscultation without rales, wheezing or  rhonchi  ABDOMEN: Soft, non-tender, non-distended no abdominal bruits MUSCULOSKELETAL:  No edema; No deformity  SKIN: Warm and dry NEUROLOGIC:  Alert and oriented x 3 PSYCHIATRIC:  Normal affect   ASSESSMENT:    1. SVT (supraventricular tachycardia) ([place])   2. Coronary artery disease involving native coronary artery of native heart without angina pectoris   3. Spontaneous dissection of coronary artery   4. Pure hypercholesterolemia    PLAN:    In order of problems listed above:  SVT - stopping metoprolol 12.5 mg PO BID because of associated fatigue; her SVT was not associated with symptoms on the patch, no malignant arrhythmias seen  Coronary Artery Disease with OM3 intervention in the past HLD - asymptomatic  - anatomy: 1 v CAD OM3 - continue ASA 81 mg - unable to tolerate Zetia or statin; on repatha with no issues  Spontaneous Coroanry Artery Dissection (possible) - without family history of arteriopathy - no bruits on exam - On ASA, BB as above Brain to Pelvis Screening is recommended per guideline including - Renal Artery Duplex - Bilateral Carotid Artery Duplex - MRA Brain - Chest CT Angiography Chest - SHARED DECISION MAKING:  Will defer at this point given unclear intervention per the guidelines if we finding asymptomatic disease.  Low threshold for CTA if new dilation in aorta on follow up echo.  Will re-evaluate at subsequent meetings  Six months follow up unless new symptoms or abnormal test results warranting change in plan  Would be reasonable for Virtual Follow up  Would be reasonable for APP Follow up   Medication Adjustments/Labs and Tests Ordered: Current medicines are reviewed at length with the  patient today.  Concerns regarding medicines are outlined above.  No orders of the defined types were placed in this encounter.  No orders of the defined types were placed in this encounter.   Patient Instructions  Medication Instructions:  Discontinue Metoprolol   *If you need a refill on your cardiac medications before your next appointment, please call your pharmacy*   Lab Work: None ordered   If you have labs (blood work) drawn today and your tests are completely normal, you will receive your results only by: Marland Kitchen MyChart Message (if you have MyChart) OR . A paper copy in the mail If you have any lab test that is abnormal or we need to change your treatment, we will call you to review the results.   Testing/Procedures: None ordered    Follow-Up: At [facility], you and your health needs are our priority.  As part of our continuing mission to provide you with exceptional heart care, we have created designated Provider Care Teams.  These Care Teams include your primary Cardiologist (physician) and Advanced Practice Providers (APPs -  Physician Assistants and Nurse Practitioners) who all work together to provide you with the care you need, when you need it.  We recommend signing up for the patient portal called "MyChart".  Sign up information is provided on this After Visit Summary.  MyChart is used to connect with patients for Virtual Visits (Telemedicine).  Patients are able to view lab/test results, encounter notes, upcoming appointments, etc.  Non-urgent messages can be sent to your provider as well.   To learn more about what you can do with MyChart, go to [email or web address].    Your next appointment:   6 month(s)  The format for your next appointment:   In Person  Provider:   You may see Werner Lean, MD or  one of the following Advanced Practice Providers on your designated Care Team:    Melina Copa, PA-C  Ermalinda Barrios, PA-C    Other  Instructions None      Signed, Werner Lean, MD  03/01/2020 4:07 PM    [facility]

## 2020-03-02 ENCOUNTER — Other Ambulatory Visit: Payer: Self-pay | Admitting: Family Medicine

## 2020-03-02 DIAGNOSIS — F5101 Primary insomnia: Secondary | ICD-10-CM

## 2020-03-02 NOTE — Telephone Encounter (Signed)
Requesting:Ambien 10mg  Contract: UDS: Last Visit:01/22/20 v/v Next Visit:n/a Last Refill:11/27/19 30 tabs 3 refills Please Advise This may be a dupilcate

## 2020-03-29 ENCOUNTER — Other Ambulatory Visit: Payer: Self-pay | Admitting: Family Medicine

## 2020-04-18 ENCOUNTER — Encounter: Payer: Self-pay | Admitting: Family Medicine

## 2020-04-20 ENCOUNTER — Other Ambulatory Visit: Payer: Self-pay | Admitting: Family Medicine

## 2020-04-24 ENCOUNTER — Other Ambulatory Visit: Payer: Self-pay | Admitting: Family Medicine

## 2020-04-26 MED ORDER — FLUOXETINE HCL 40 MG PO CAPS: 40.0000 mg | ORAL_CAPSULE | Freq: Every day | ORAL | 0 refills | Status: DC

## 2020-04-26 MED ORDER — PANTOPRAZOLE SODIUM 40 MG PO TBEC: 40.0000 mg | DELAYED_RELEASE_TABLET | Freq: Every day | ORAL | 0 refills | Status: DC

## 2020-04-26 NOTE — Telephone Encounter (Signed)
Please schedule an appointment with Dr.Tabori for further refills. 

## 2020-05-12 ENCOUNTER — Ambulatory Visit: Payer: 59 | Admitting: Family Medicine

## 2020-05-17 ENCOUNTER — Other Ambulatory Visit: Payer: Self-pay | Admitting: Family Medicine

## 2020-05-17 NOTE — Telephone Encounter (Signed)
Requesting:Flexeril 10mg  Contract: UDS: Last Visit:01/22/20 v/v Next Visit:n/a Last Refill:04/20/20 30 tabs 0 refills  Xanax 0.5mg  LR 02/12/20 30 tabs 3 refills   Please Advise

## 2020-06-09 ENCOUNTER — Other Ambulatory Visit: Payer: Self-pay | Admitting: Family Medicine

## 2020-06-09 ENCOUNTER — Encounter: Payer: Self-pay | Admitting: Family Medicine

## 2020-06-10 ENCOUNTER — Other Ambulatory Visit: Payer: Self-pay

## 2020-06-10 MED ORDER — TRAMADOL HCL 50 MG PO TABS: 50.0000 mg | ORAL_TABLET | Freq: Three times a day (TID) | ORAL | 3 refills | Status: DC | PRN

## 2020-06-10 NOTE — Telephone Encounter (Signed)
LFD 03/02/20 #30 with 3 refills LOV 01/22/20 NOV 06/28/20 Looks like patient not due until 06/30/20

## 2020-06-28 ENCOUNTER — Encounter: Payer: Self-pay | Admitting: Family Medicine

## 2020-06-28 ENCOUNTER — Other Ambulatory Visit: Payer: Self-pay

## 2020-06-28 ENCOUNTER — Ambulatory Visit (INDEPENDENT_AMBULATORY_CARE_PROVIDER_SITE_OTHER): Payer: 59 | Admitting: Family Medicine

## 2020-06-28 VITALS — BP 132/80 | HR 112 | Temp 98.5°F | Resp 18 | Ht 62.0 in | Wt 199.2 lb

## 2020-06-28 DIAGNOSIS — Z0000 Encounter for general adult medical examination without abnormal findings: Secondary | ICD-10-CM

## 2020-06-28 DIAGNOSIS — F418 Other specified anxiety disorders: Secondary | ICD-10-CM

## 2020-06-28 DIAGNOSIS — R911 Solitary pulmonary nodule: Secondary | ICD-10-CM

## 2020-06-28 DIAGNOSIS — E559 Vitamin D deficiency, unspecified: Secondary | ICD-10-CM

## 2020-06-28 DIAGNOSIS — Z1231 Encounter for screening mammogram for malignant neoplasm of breast: Secondary | ICD-10-CM | POA: Diagnosis not present

## 2020-06-28 DIAGNOSIS — Z1211 Encounter for screening for malignant neoplasm of colon: Secondary | ICD-10-CM | POA: Diagnosis not present

## 2020-06-28 DIAGNOSIS — R142 Eructation: Secondary | ICD-10-CM | POA: Insufficient documentation

## 2020-06-28 DIAGNOSIS — R141 Gas pain: Secondary | ICD-10-CM | POA: Insufficient documentation

## 2020-06-28 DIAGNOSIS — R1111 Vomiting without nausea: Secondary | ICD-10-CM | POA: Insufficient documentation

## 2020-06-28 LAB — HEPATIC FUNCTION PANEL
ALT: 12 U/L (ref 0–35)
AST: 15 U/L (ref 0–37)
Albumin: 4.4 g/dL (ref 3.5–5.2)
Alkaline Phosphatase: 82 U/L (ref 39–117)
Bilirubin, Direct: 0.1 mg/dL (ref 0.0–0.3)
Total Bilirubin: 0.6 mg/dL (ref 0.2–1.2)
Total Protein: 6.7 g/dL (ref 6.0–8.3)

## 2020-06-28 LAB — CBC WITH DIFFERENTIAL/PLATELET
Basophils Absolute: 0 10*3/uL (ref 0.0–0.1)
Basophils Relative: 0.5 % (ref 0.0–3.0)
Eosinophils Absolute: 0.1 10*3/uL (ref 0.0–0.7)
Eosinophils Relative: 1.9 % (ref 0.0–5.0)
HCT: 39 % (ref 36.0–46.0)
Hemoglobin: 13.6 g/dL (ref 12.0–15.0)
Lymphocytes Relative: 27 % (ref 12.0–46.0)
Lymphs Abs: 1 10*3/uL (ref 0.7–4.0)
MCHC: 34.7 g/dL (ref 30.0–36.0)
MCV: 88.7 fl (ref 78.0–100.0)
Monocytes Absolute: 0.3 10*3/uL (ref 0.1–1.0)
Monocytes Relative: 7.3 % (ref 3.0–12.0)
Neutro Abs: 2.3 10*3/uL (ref 1.4–7.7)
Neutrophils Relative %: 63.3 % (ref 43.0–77.0)
Platelets: 255 10*3/uL (ref 150.0–400.0)
RBC: 4.4 Mil/uL (ref 3.87–5.11)
RDW: 14.4 % (ref 11.5–15.5)
WBC: 3.7 10*3/uL — ABNORMAL LOW (ref 4.0–10.5)

## 2020-06-28 LAB — VITAMIN D 25 HYDROXY (VIT D DEFICIENCY, FRACTURES): VITD: 32.91 ng/mL (ref 30.00–100.00)

## 2020-06-28 LAB — LIPID PANEL
Cholesterol: 134 mg/dL (ref 0–200)
HDL: 47.3 mg/dL (ref >39.00)
LDL Cholesterol: 68 mg/dL (ref 0–99)
NonHDL: 86.73
Total CHOL/HDL Ratio: 3
Triglycerides: 93 mg/dL (ref 0.0–149.0)
VLDL: 18.6 mg/dL (ref 0.0–40.0)

## 2020-06-28 LAB — BASIC METABOLIC PANEL
BUN: 17 mg/dL (ref 6–23)
CO2: 25 mEq/L (ref 19–32)
Calcium: 9.2 mg/dL (ref 8.4–10.5)
Chloride: 104 mEq/L (ref 96–112)
Creatinine, Ser: 0.69 mg/dL (ref 0.40–1.20)
GFR: 96.24 mL/min (ref >60.00)
Glucose, Bld: 90 mg/dL (ref 70–99)
Potassium: 4.3 mEq/L (ref 3.5–5.1)
Sodium: 139 mEq/L (ref 135–145)

## 2020-06-28 LAB — TSH: TSH: 2.12 u[IU]/mL (ref 0.35–4.50)

## 2020-06-28 MED ORDER — BUPROPION HCL ER (XL) 150 MG PO TB24: 150.0000 mg | ORAL_TABLET | Freq: Every day | ORAL | 3 refills | Status: DC

## 2020-06-28 MED ORDER — CLOTRIMAZOLE-BETAMETHASONE 1-0.05 % EX CREA: 1.0000 "application " | TOPICAL_CREAM | Freq: Every day | CUTANEOUS | 1 refills | Status: AC

## 2020-06-28 NOTE — Patient Instructions (Addendum)
Follow up in 1 month to recheck mood We'll notify you of your lab results and make any changes if needed ADD the Wellbutrin once daily for mood USE the Lotrisone combo cream twice daily on the leg Complete and return the Cologuard as directed We'll call you to schedule your mammogram and CT scan If the chest pressure changes or worsens- call Cards immediately If the diarrhea returns, let me know so we can refer to GI Call with any questions or concerns Hang in there!!!

## 2020-06-28 NOTE — Assessment & Plan Note (Signed)
Pt's PE unchanged from previous w/ exception of apparent ring worm on L lower leg.  She has multiple items that are + on ROS.  She follows w/ cardiology and they are managing her chest pressure, palpitations, and SOB.  She declines GI referral today for dysphagia.  She is due to mammo and cologuard- both ordered.  Previous imaging showed a 5 mm lung nodule that is due for repeat imaging to ensure stability (pt has hx of smoking).  Check labs.  Anticipatory guidance provided.

## 2020-06-28 NOTE — Progress Notes (Signed)
Subjective:    Patient ID: Maria Bishop, female    DOB: [birth year], 60 y.o.   MRN: [id number]  HPI CPE- due for cologuard and mammo.  UTD on Tdap, COVID, pap  Reviewed past medical, surgical, family and social histories.   Patient Care Team    Relationship Specialty Notifications Start End  [name], MD PCP - General Family Medicine  07/31/14   Werner Lean, MD PCP - Cardiology Cardiology  11/27/19   Cameron Sprang, MD Consulting Physician Neurology  06/16/19     Health Maintenance  Topic Date Due  . Fecal DNA (Cologuard)  07/02/2018  . MAMMOGRAM  10/12/2018  . COVID-19 Vaccine (3 - Booster for Pfizer series) 07/14/2020 (Originally 09/28/2019)  . Hepatitis C Screening  08/19/2020 (Originally 12/02/1980)  . HIV Screening  08/19/2020 (Originally 12/02/1977)  . INFLUENZA VACCINE  09/06/2020  . PAP SMEAR-Modifier  01/24/2021  . TETANUS/TDAP  06/05/2024  . HPV VACCINES  Aged Out      Review of Systems Patient reports no vision/ hearing changes, adenopathy,fever, weight change,  persistant/recurrent hoarseness, palpitations, edema, persistant/recurrent cough, hemoptysis, gastrointestinal bleeding (melena, rectal bleeding), abdominal pain, significant heartburn, bowel changes, GU symptoms (dysuria, hematuria, incontinence), Gyn symptoms (abnormal  bleeding, pain),  syncope, focal weakness, memory loss, hair/nail changes, abnormal bruising or bleeding  + skin change L lower leg  + dysphagia- solid food, 'I have to be careful' but 'i'm fine'.  Not interested in GI referral  + chest pressure- cardiology aware  + numbness hands  + SOB- typically w/ SVT  + depression- PHQ=23, 'my whole life has changed'.  Husband 'kicked me out'- had to find a place to live.  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  'life in general sucks'.  Pt is hesitant to adjust meds b/c she had brain fog previously w/ different medications.  Not interested in  counseling- 'I just don't like being around people anymore'.  Doesn't feel it would be helpful.  This visit occurred during the SARS-CoV-2 public health emergency.  Safety protocols were in place, including screening questions prior to the visit, additional usage of staff PPE, and extensive cleaning of exam room while observing appropriate contact time as indicated for disinfecting solutions.       Objective:   Physical Exam General Appearance:    Alert, cooperative, no distress, appears stated age, obese  Head:    Normocephalic, without obvious abnormality, atraumatic  Eyes:    PERRL, conjunctiva/corneas clear, EOM's intact, fundi    benign, both eyes  Ears:    Normal TM's and external ear canals, both ears  Nose:   Deferred due to COVID  Throat:   Neck:   Supple, symmetrical, trachea midline, no adenopathy;    Thyroid: no enlargement/tenderness/nodules  Back:     Symmetric, no curvature, ROM normal, no CVA tenderness  Lungs:     Clear to auscultation bilaterally, respirations unlabored  Chest Wall:    No tenderness or deformity   Heart:    Regular rate and rhythm, S1 and S2 normal, no murmur, rub   or gallop  Breast Exam:    Deferred to mammo  Abdomen:     Soft, non-tender, bowel sounds active all four quadrants,    no masses, no organomegaly  Genitalia:    Deferred to GYN  Rectal:    Extremities:   Extremities normal, atraumatic, no cyanosis or edema  Pulses:   2+ and symmetric all extremities  Skin:  Skin color, texture, turgor normal, apparent ring worm on L lower leg  Lymph nodes:   Cervical, supraclavicular, and axillary nodes normal  Neurologic:   CNII-XII intact, normal strength, sensation and reflexes    throughout          Assessment & Plan:

## 2020-06-28 NOTE — Assessment & Plan Note (Signed)
Deteriorated.  Pt scored 23 on PHQ despite being on Fluoxetine.  She is very unhappy with work, her long term relationship ended w/ no reason given and she had to find a new place to live.  She admits to thoughts of self harm but doesn't have a plan and states she will not act on them.  She is not interested in counseling.  At this time, will restart Wellbutrin and monitor mood closely.  Pt expressed understanding and is in agreement w/ plan.

## 2020-06-28 NOTE — Assessment & Plan Note (Signed)
Pt's BMI is 36.43.  Given her other medical issues, this qualifies as morbidly obese.  Encouraged healthy diet and regular exercise.  Check labs to risk stratify.  Will follow.

## 2020-06-28 NOTE — Assessment & Plan Note (Signed)
Check labs and replete prn. 

## 2020-07-01 ENCOUNTER — Other Ambulatory Visit: Payer: Self-pay | Admitting: Family Medicine

## 2020-07-01 DIAGNOSIS — F5101 Primary insomnia: Secondary | ICD-10-CM

## 2020-07-01 NOTE — Telephone Encounter (Signed)
LFD for zolpidem 03/02/20 #30 with 3 refills LFD for cyclobenzaprine 06/09/20 #30 with no refills LOV 06/28/20 NOV none

## 2020-07-07 ENCOUNTER — Other Ambulatory Visit: Payer: Self-pay | Admitting: Family Medicine

## 2020-07-12 ENCOUNTER — Telehealth: Payer: Self-pay

## 2020-07-12 NOTE — Telephone Encounter (Signed)
Was on hold for ~20 minutes before call was disconnected.  Will try again tomorrow

## 2020-07-12 NOTE — Telephone Encounter (Signed)
Informed patient of pcp information. She voiced understanding and would like to be informed of the outcome.

## 2020-07-12 NOTE — Telephone Encounter (Signed)
Patient called in stating she had received a letter from her insurance company in reference to the CT Dr. Birdie Riddle ordered.    Wanted to know if there was anything she could supply to [employer].  I informed her that it looks like her insurance company denied the CT and that we are waiting on a possible Peer to Peer to be completed.   Please advise.

## 2020-07-12 NOTE — Telephone Encounter (Signed)
Attempted to call patient but mailbox is full.  

## 2020-07-12 NOTE — Telephone Encounter (Signed)
I was going to attempt to get it approved w/ a peer to peer this afternoon

## 2020-07-13 ENCOUNTER — Ambulatory Visit
Admission: RE | Admit: 2020-07-13 | Discharge: 2020-07-13 | Disposition: A | Discharge status: Home / Self Care | Payer: 59 | Source: Ambulatory Visit | Attending: Family Medicine | Admitting: Family Medicine

## 2020-07-13 ENCOUNTER — Other Ambulatory Visit: Payer: Self-pay

## 2020-07-13 DIAGNOSIS — Z1231 Encounter for screening mammogram for malignant neoplasm of breast: Secondary | ICD-10-CM

## 2020-07-20 ENCOUNTER — Encounter: Payer: Self-pay | Admitting: Family Medicine

## 2020-07-21 ENCOUNTER — Telehealth: Payer: Self-pay

## 2020-07-21 MED ORDER — ALPRAZOLAM 0.5 MG PO TABS: ORAL_TABLET | ORAL | 3 refills | Status: DC

## 2020-07-21 NOTE — Telephone Encounter (Signed)
Spoke with pharmacy and was notified that an override had to be done in order to fill the Xanax because the patient is already taking tramadol and there is an interaction. They wanted you to be aware that she was on both.

## 2020-07-21 NOTE — Telephone Encounter (Signed)
Yes, i'm aware.  She is only to take them as needed and not at the same time

## 2020-07-23 NOTE — Telephone Encounter (Signed)
Ever able to get thru? Please advise

## 2020-07-30 ENCOUNTER — Encounter: Payer: Self-pay | Admitting: Orthopaedic Surgery

## 2020-07-30 NOTE — Telephone Encounter (Signed)
Next week

## 2020-07-30 NOTE — Telephone Encounter (Signed)
Might be a good idea for [place] to take a look in person

## 2020-08-03 ENCOUNTER — Ambulatory Visit: Payer: Self-pay

## 2020-08-03 ENCOUNTER — Encounter: Payer: Self-pay | Admitting: Orthopaedic Surgery

## 2020-08-03 ENCOUNTER — Ambulatory Visit (INDEPENDENT_AMBULATORY_CARE_PROVIDER_SITE_OTHER): Payer: 59 | Admitting: Orthopaedic Surgery

## 2020-08-03 DIAGNOSIS — Z96652 Presence of left artificial knee joint: Secondary | ICD-10-CM | POA: Diagnosis not present

## 2020-08-03 NOTE — Progress Notes (Signed)
Post-Op Visit Note   Patient: Maria Bishop           Date of Birth: 04/07/1960           MRN: [id number] Visit Date: 08/03/2020 PCP: [name], MD   Assessment & Plan:  Chief Complaint:  Chief Complaint  Patient presents with   Left Knee - Pain   Visit Diagnoses:  1. Status post total left knee replacement     Plan: Ms. Harlow Mares is status post left total knee replacement on 10/01/2019.  She has had a few falls in the last couple weeks that is unrelated to the left knee.  She feels like there is some swelling and some throbbing aching discomfort.  She is back to work.  She has noticed a bruise across the side of the left knee.  Denies any instability to the knee.  Left knee shows a fully healed surgical scar without any evidence of infection.  Stable to varus valgus stress and anterior posterior drawer.  Trace joint effusion.  Range of motion is at baseline.  X-rays unremarkable.  Based on findings my impression is overuse and inflammation of the left knee status post replacement about 10 months ago.  I have recommended a relative rest and compression knee brace during activity especially during the day and at work.  Ibuprofen works well for her which she will continue to use.  I do not recommend aspirating the knee unless the effusion becomes larger or more symptomatic.  We will plan on seeing her back at her 1 year visit in 2 months with two-view x-rays of the left knee.  Follow-Up Instructions: Return in about 2 months (around 10/03/2020).   Orders:  Orders Placed This Encounter  Procedures   XR KNEE 3 VIEW LEFT   No orders of the defined types were placed in this encounter.   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/01/2020   SVT  (supraventricular tachycardia) ([place]) 11/27/2019   Anemia 11/27/2019   Pure hypercholesterolemia 11/27/2019   Tachycardia 10/29/2019   Status post total left knee replacement 10/01/2019   Binge eating disorder 08/20/2019   Primary osteoarthritis of left knee 07/30/2019   Morbid obesity ([place]) 03/10/2019   Primary osteoarthritis of both knees 03/27/2018   Primary osteoarthritis of both hands 03/27/2018   DDD (degenerative disc disease), lumbar 03/27/2018   DDD (degenerative disc disease), cervical 03/27/2018   Vitamin D deficiency 01/24/2018   Chest pain 01/25/2016   Lumbar radiculopathy 08/06/2015   Memory loss 12/02/2014   History of cervical cancer 12/02/2014   History of MI (myocardial infarction) 12/02/2014   Insomnia 08/31/2014   GERD (gastroesophageal reflux disease) 07/08/2013   Abdominal pain, epigastric 07/08/2013   Screening for malignant neoplasm of cervix 07/19/2012   Routine general medical examination at a health care facility 07/19/2012   Depression with anxiety 06/06/2012   Hyperlipidemia 09/06/2011   Coronary artery disease involving native coronary artery of native heart without angina pectoris 06/24/2001   Past Medical History:  Diagnosis Date   Anxiety    CAD (coronary artery disease)    a. NSTEMI 2013: likely spontaneous dissection/ruptured plaque in OM3 - PCI: 3 overlapping Promus DES to OM3, EF 55% b. cath 2014: patent stents   Cervical cancer ([place])  Cervical cancer   Degenerative disc disease    Depression    GERD (gastroesophageal reflux disease)    Headache    Heart murmur    MRSA infection    Myocardial infarction [facility])    ~54yrs ago   NSTEMI (non-ST elevated myocardial infarction) ([place]) 06/24/2011   a. NSTEMI 2013: likely spontaneous dissection/ruptured plaque in OM3 - PCI: 3 overlapping Promus DES to OM3, EF 55%    Pneumonia    As a child    Family History  Problem Relation Age of Onset   Alzheimer's disease Maternal Uncle    Coronary  artery disease Mother        MI in her 63s   Hypertension Mother    Early death Father    Brain cancer Sister    Drug abuse Son    Asthma Daughter    Anxiety disorder Daughter    Breast cancer Maternal Grandmother     Past Surgical History:  Procedure Laterality Date   APPENDECTOMY     [place] bunionectomy right  February 2015   CARDIAC CATHETERIZATION N/A 01/26/2016   Procedure: Left Heart Cath and Coronary Angiography;  Surgeon: Belva Crome, MD;  Location: [place] [facility];  Service: Cardiovascular;  Laterality: N/A;   CERVICAL BIOPSY     JOINT REPLACEMENT  09/2019   total left knee replacement   LEFT HEART CATHETERIZATION WITH CORONARY ANGIOGRAM N/A 06/24/2011   Procedure: LEFT HEART CATHETERIZATION WITH CORONARY ANGIOGRAM;  Surgeon: Jettie Booze, MD;  [facility] CATH LAB;  Service: Cardiovascular;  Laterality: N/A;   PERCUTANEOUS CORONARY STENT INTERVENTION (PCI-S) Right 06/24/2011   Procedure: PERCUTANEOUS CORONARY STENT INTERVENTION (PCI-S);  Surgeon: Jettie Booze, MD;  Location: [facility] CATH LAB;  Service: Cardiovascular;  Laterality: Right;   TOTAL KNEE ARTHROPLASTY Left 10/01/2019   Procedure: LEFT TOTAL KNEE ARTHROPLASTY;  Surgeon: Leandrew Koyanagi, MD;  Location: [facility];  Service: Orthopedics;  Laterality: Left;   TUBAL LIGATION     WISDOM TOOTH EXTRACTION     Social History   Occupational History   Occupation: Customer service manager  Tobacco Use   Smoking status: Former    Pack years: 0.00    Types: Cigarettes   Smokeless tobacco: Never   Tobacco comments:    quit over 30 years ago   Vaping Use   Vaping Use: Never used  Substance and Sexual Activity   Alcohol use: Yes    Alcohol/week: 0.0 standard drinks    Comment: Occasional   Drug use: No   Sexual activity: Not Currently    Birth control/protection: Condom, Surgical, Post-menopausal    Comment: Tubal ligation

## 2020-08-04 ENCOUNTER — Encounter: Payer: Self-pay | Admitting: *Deleted

## 2020-08-06 ENCOUNTER — Encounter: Payer: Self-pay | Admitting: Family Medicine

## 2020-08-06 ENCOUNTER — Other Ambulatory Visit: Payer: Self-pay | Admitting: Family Medicine

## 2020-08-06 MED ORDER — TRAMADOL HCL 50 MG PO TABS: 50.0000 mg | ORAL_TABLET | Freq: Three times a day (TID) | ORAL | 0 refills | Status: DC | PRN

## 2020-08-11 ENCOUNTER — Other Ambulatory Visit: Payer: Self-pay

## 2020-08-11 DIAGNOSIS — Z1211 Encounter for screening for malignant neoplasm of colon: Secondary | ICD-10-CM

## 2020-08-18 ENCOUNTER — Other Ambulatory Visit: Payer: Self-pay | Admitting: Family Medicine

## 2020-08-21 ENCOUNTER — Other Ambulatory Visit: Payer: Self-pay | Admitting: Family Medicine

## 2020-08-23 ENCOUNTER — Telehealth: Payer: 59 | Admitting: Family Medicine

## 2020-08-23 ENCOUNTER — Telehealth: Payer: Self-pay

## 2020-08-23 NOTE — Telephone Encounter (Signed)
Pt definitely needs a video or telehealth visit before something can be prescribed.  If there are truly ZERO [facility] appts this week she can either do a [facility] visit or go to UC.  But please double check [facility] availability

## 2020-08-23 NOTE — Telephone Encounter (Signed)
[facility] for patient to be seen by [facility] video visit this week?

## 2020-08-23 NOTE — Telephone Encounter (Signed)
Pt appt made with Dr Elease Hashimoto at [facility] tomorrow at 3:00

## 2020-08-23 NOTE — Telephone Encounter (Signed)
Pt has taken three Covid test all negative. Pt is very congested, coughing, aching, green stool,and sore throat.  Pt has taken musinex and Ibuprofen. I do not have any slots at no [facility] office this week I can put her in for Mychart is there something that can be prescribed or will she needs to go to Urgent Care?   Pt call back [phone]

## 2020-08-24 ENCOUNTER — Other Ambulatory Visit: Payer: Self-pay

## 2020-08-24 ENCOUNTER — Telehealth (INDEPENDENT_AMBULATORY_CARE_PROVIDER_SITE_OTHER): Payer: 59 | Admitting: Family Medicine

## 2020-08-24 DIAGNOSIS — J069 Acute upper respiratory infection, unspecified: Secondary | ICD-10-CM

## 2020-08-24 NOTE — Telephone Encounter (Signed)
Last filled 08/19/20 #30 with no refills Last visit 06/28/20 Next visit 08/24/20

## 2020-08-24 NOTE — Progress Notes (Signed)
Patient ID: Maria Bishop, female   DOB: Jan 24, 1961, 60 y.o.   MRN: [id number]   This visit type was conducted due to national recommendations for restrictions regarding the COVID-19 pandemic in an effort to limit this patient's exposure and mitigate transmission in our community.   Virtual Visit via Video Note  I connected with Maria Bishop  on 08/24/20 at  2:45 PM EDT by a video enabled telemedicine application and verified that I am speaking with the correct person using two identifiers.  Location patient: home Location provider:work or home office Persons participating in the virtual visit: patient, provider  I discussed the limitations of evaluation and management by telemedicine and the availability of in person appointments. The patient expressed understanding and agreed to proceed.   HPI: Ms. Slape developed symptoms of diarrhea last Thursday.  This lasted about 3 days but has since resolved.  No nausea or vomiting.  No fever.  Saturday she developed sore throat.  She then developed some nasal congestion.  She started some leftover doxycycline on Saturday along with Mucinex and Robitussin.  Cough has been dry.  She has actually done 4 COVID tests including 3 at home and 1 at [facility] and they have all been negative.  She had occasional chills but no documented fever.  No dyspnea.  Out of work since last Thursday.   ROS: See pertinent positives and negatives per HPI.  Past Medical History:  Diagnosis Date   Anxiety    CAD (coronary artery disease)    a. NSTEMI 2013: likely spontaneous dissection/ruptured plaque in OM3 - PCI: 3 overlapping Promus DES to OM3, EF 55% b. cath 2014: patent stents   Cervical cancer ([place])    Cervical cancer   Degenerative disc disease    Depression    GERD (gastroesophageal reflux disease)    Headache    Heart murmur    MRSA infection    Myocardial infarction ([place])    ~31yr ago   NSTEMI (non-ST elevated myocardial infarction) ([place]  06/24/2011   a. NSTEMI 2013: likely spontaneous dissection/ruptured plaque in OM3 - PCI: 3 overlapping Promus DES to OM3, EF 55%    Pneumonia    As a child    Past Surgical History:  Procedure Laterality Date   APPENDECTOMY     [place] bunionectomy right  February 2015   CARDIAC CATHETERIZATION N/A 01/26/2016   Procedure: Left Heart Cath and Coronary Angiography;  Surgeon: HBelva Crome MD;  [place];  Service: Cardiovascular;  Laterality: N/A;   CERVICAL BIOPSY     JOINT REPLACEMENT  09/2019   total left knee replacement   LEFT HEART CATHETERIZATION WITH CORONARY ANGIOGRAM N/A 06/24/2011   Procedure: LEFT HEART CATHETERIZATION WITH CORONARY ANGIOGRAM;  Surgeon: JJettie Booze MD;  [facility];  Service: Cardiovascular;  Laterality: N/A;   PERCUTANEOUS CORONARY STENT INTERVENTION (PCI-S) Right 06/24/2011   Procedure: PERCUTANEOUS CORONARY STENT INTERVENTION (PCI-S);  Surgeon: JJettie Booze MD;  [facility];  Service: Cardiovascular;  Laterality: Right;   TOTAL KNEE ARTHROPLASTY Left 10/01/2019   Procedure: LEFT TOTAL KNEE ARTHROPLASTY;  Surgeon: XLeandrew Koyanagi MD;  [facility];  Service: Orthopedics;  Laterality: Left;   TUBAL LIGATION     WISDOM TOOTH EXTRACTION      Family History  Problem Relation Age of Onset   Alzheimer's disease Maternal Uncle    Coronary artery disease Mother        MI in her 457s  Hypertension Mother    Early death Father    Brain cancer Sister    Drug abuse Son    Asthma Daughter    Anxiety disorder Daughter    Breast cancer Maternal Grandmother     SOCIAL HX:   Non-smoker   Current Outpatient Medications:    albuterol (VENTOLIN HFA) 108 (90 Base) MCG/ACT inhaler, Inhale 2 puffs into the lungs every 4 (four) hours as needed for wheezing or shortness of breath., Disp: 18 g, Rfl: 0   ALPRAZolam (XANAX) 0.5 MG tablet, TAKE 1 TABLET BY MOUTH 2 TIMES DAILY AS NEEDED, Disp: 30 tablet, Rfl: 3    aspirin 81 MG EC tablet, Take 81 mg by mouth daily. Swallow whole., Disp: , Rfl:    clotrimazole-betamethasone (LOTRISONE) cream, Apply 1 application topically daily., Disp: 45 g, Rfl: 1   COVID-19 Specimen Collection KIT, See admin instructions. for testing, Disp: , Rfl:    Cyanocobalamin (VITAMIN B-12 PO), Take 1 tablet by mouth daily., Disp: , Rfl:    Evolocumab (REPATHA SURECLICK) 188 MG/ML SOAJ, Inject 140 mg into the skin every 14 (fourteen) days., Disp: 2 mL, Rfl: 11   fexofenadine (ALLEGRA) 180 MG tablet, Take 180 mg by mouth 3 (three) times a week. Reported on 06/02/2015, Disp: , Rfl:    FLUoxetine (PROZAC) 40 MG capsule, TAKE 1 CAPSULE BY MOUTH EVERY DAY, Disp: 90 capsule, Rfl: 0   lidocaine (XYLOCAINE) 2 % solution, Use as directed 5 mLs in the mouth or throat every 6 (six) hours as needed for mouth pain., Disp: 100 mL, Rfl: 0   nitroGLYCERIN (NITROSTAT) 0.4 MG SL tablet, Place 1 tablet (0.4 mg total) under the tongue every 5 (five) minutes as needed., Disp: 25 tablet, Rfl: 3   ondansetron (ZOFRAN) 4 MG tablet, Take 1 tablet (4 mg total) by mouth every 8 (eight) hours as needed for nausea or vomiting., Disp: 40 tablet, Rfl: 0   pantoprazole (PROTONIX) 40 MG tablet, TAKE 1 TABLET BY MOUTH EVERY DAY, Disp: 90 tablet, Rfl: 0   traMADol (ULTRAM) 50 MG tablet, Take 1 tablet (50 mg total) by mouth every 8 (eight) hours as needed., Disp: 30 tablet, Rfl: 0   Vitamin D3 (VITAMIN D) 25 MCG tablet, Take 1,000 Units by mouth daily., Disp: , Rfl:    zolpidem (AMBIEN) 10 MG tablet, TAKE 1 TABLET BY MOUTH NIGHTLY AT BEDTIME AS NEEDED FOR SLEEP, Disp: 30 tablet, Rfl: 3   cyclobenzaprine (FLEXERIL) 10 MG tablet, TAKE 1 TABLET BY MOUTH 3 TIMES DAILY AS NEEDED MUSCLE SPASMS, Disp: 30 tablet, Rfl: 0  EXAM:  VITALS per patient if applicable:  GENERAL: alert, oriented, appears well and in no acute distress  HEENT: atraumatic, conjunttiva clear, no obvious abnormalities on inspection of external nose and  ears  NECK: normal movements of the head and neck  LUNGS: on inspection no signs of respiratory distress, breathing rate appears normal, no obvious gross SOB, gasping or wheezing  CV: no obvious cyanosis  MS: moves all visible extremities without noticeable abnormality  PSYCH/NEURO: pleasant and cooperative, no obvious depression or anxiety, speech and thought processing grossly intact  ASSESSMENT AND PLAN:  Discussed the following assessment and plan:  URI with cough.  COVID testing x4 negative.  Patient in no respiratory distress.  -Continue plenty fluids and rest -Work note written from 08-19-2020 through 08-26-2020 -Follow-up immediately for any fever, dyspnea, or any other concerns -Continue over-the-counter medications as needed for symptomatic relief.     I discussed the assessment  and treatment plan with the patient. The patient was provided an opportunity to ask questions and all were answered. The patient agreed with the plan and demonstrated an understanding of the instructions.   The patient was advised to call back or seek an in-person evaluation if the symptoms worsen or if the condition fails to improve as anticipated.     Carolann Littler, MD

## 2020-08-25 ENCOUNTER — Other Ambulatory Visit: Payer: Self-pay | Admitting: *Deleted

## 2020-08-25 DIAGNOSIS — Z1211 Encounter for screening for malignant neoplasm of colon: Secondary | ICD-10-CM

## 2020-08-27 ENCOUNTER — Encounter: Payer: Self-pay | Admitting: Family Medicine

## 2020-08-30 IMAGING — MR MR KNEE*L* W/O CM
7 series · 40 of 40 positions shown · non-contrast
Comparison: X-ray 03/12/2018

CLINICAL DATA: Posterior left knee pain for 6 months.  Swelling.

EXAM:
MRI OF THE LEFT KNEE WITHOUT CONTRAST
TECHNIQUE: Multiplanar, multisequence MR imaging of the knee was performed. No
intravenous contrast was administered.

[Series 7: T2 fat-sat · axial · 4.0mm · 0.53mm/px · z∈[-99,+71]mm · 6 of 35 slices shown (1 of 3)]
[im 1/35]
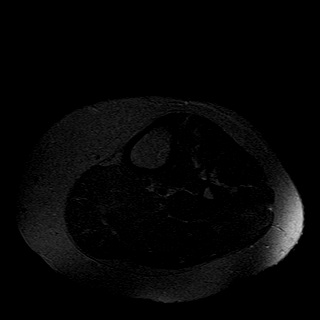
[im 7/35]
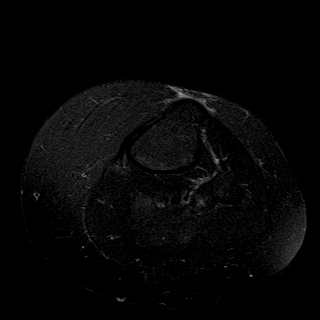
[im 14/35]
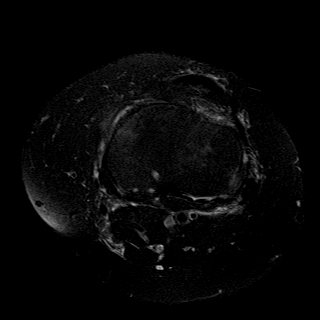
[im 21/35]
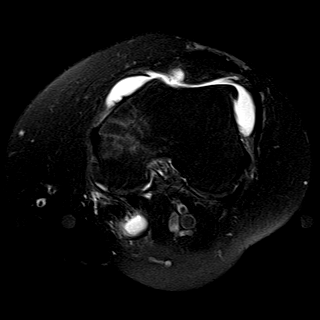
[im 28/35]
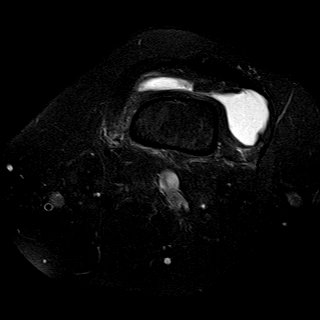
[im 35/35]
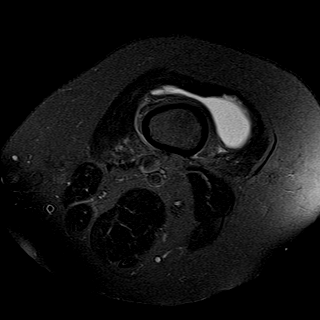

[Series 8: T1 · coronal · 4.0mm · 0.62mm/px · 6 of 29 slices shown]
[im 1/29]
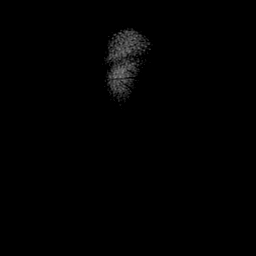
[im 6/29]
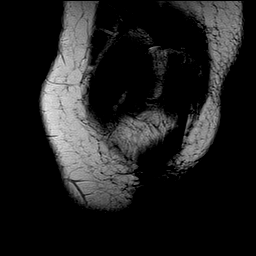
[im 12/29]
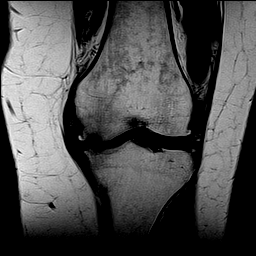
[im 17/29]
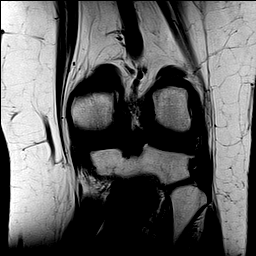
[im 23/29]
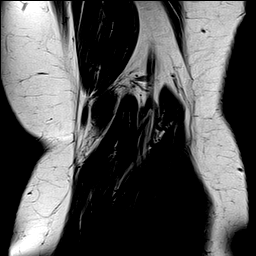
[im 29/29]
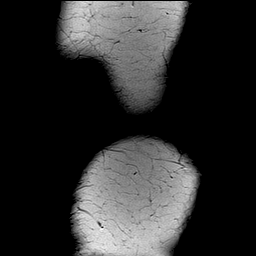

[Series 9: T2 fat-sat · coronal · 4.0mm · 0.62mm/px · 6 of 29 slices shown (2 of 3)]
[im 1/29]
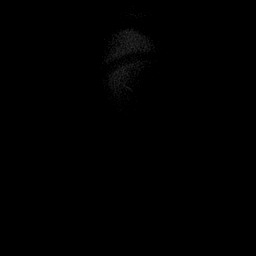
[im 6/29]
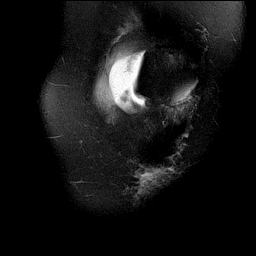
[im 12/29]
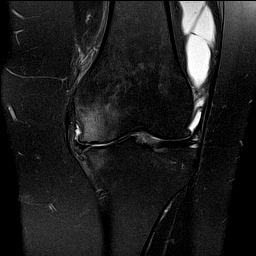
[im 17/29]
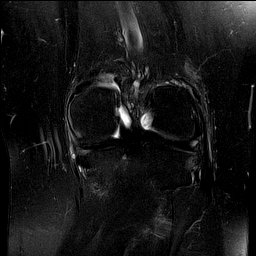
[im 23/29]
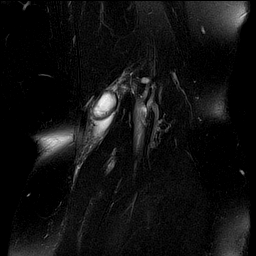
[im 29/29]
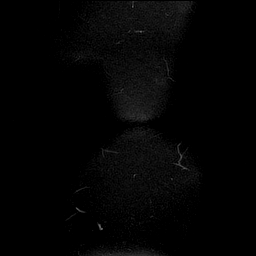

[Series 10: PD fat-sat · coronal · 4.0mm · 0.62mm/px · 6 of 29 slices shown (1 of 3)]
[im 1/29]
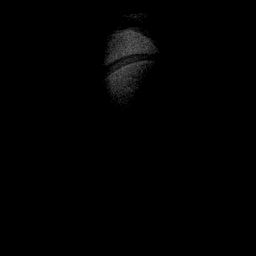
[im 6/29]
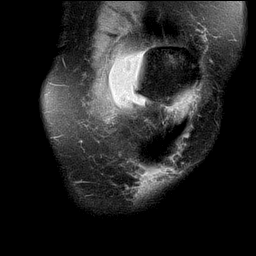
[im 12/29]
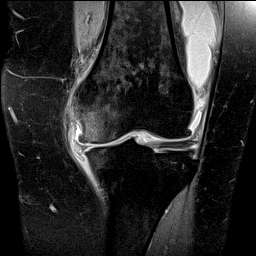
[im 17/29]
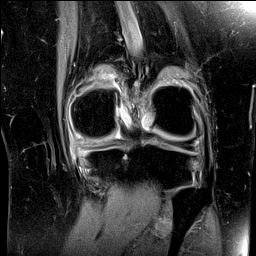
[im 23/29]
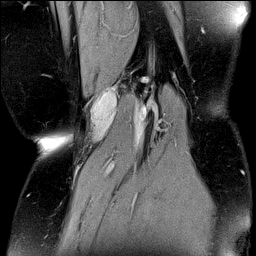
[im 29/29]
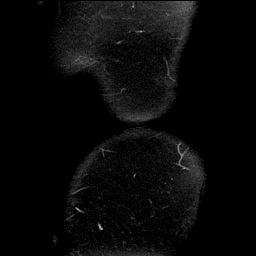

[Series 11: PD fat-sat · sagittal · 3.0mm · 0.62mm/px · 6 of 30 slices shown (2 of 3)]
[im 1/30]
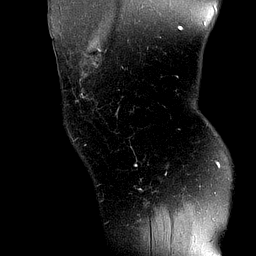
[im 6/30]
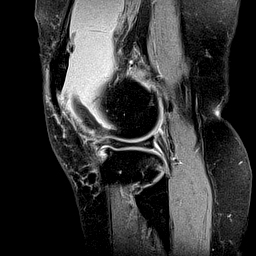
[im 12/30]
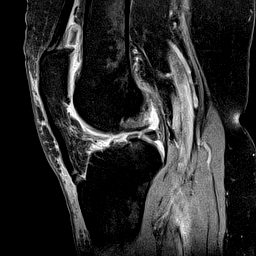
[im 18/30]
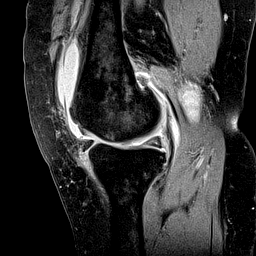
[im 24/30]
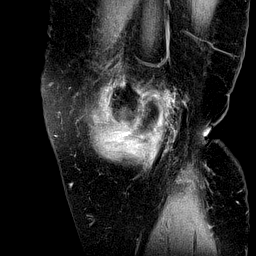
[im 30/30]
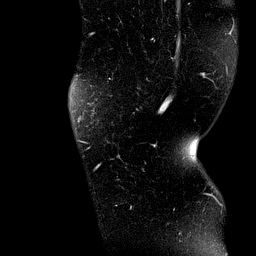

[Series 12: T2 fat-sat · sagittal · 3.0mm · 0.62mm/px · 6 of 30 slices shown (3 of 3)]
[im 1/30]
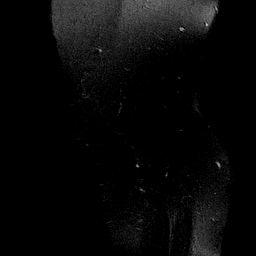
[im 6/30]
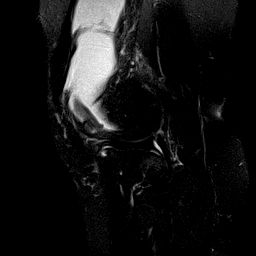
[im 12/30]
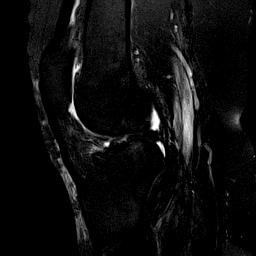
[im 18/30]
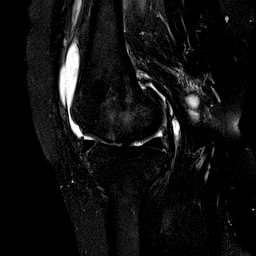
[im 24/30]
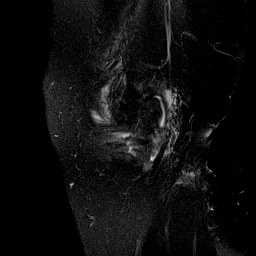
[im 30/30]
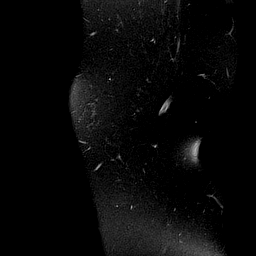

[Series 13: PD fat-sat · oblique · 2.0mm · 0.62mm/px · 4 of 19 slices shown (3 of 3)]
[im 1/19]
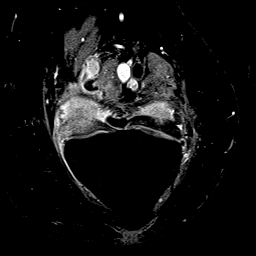
[im 7/19]
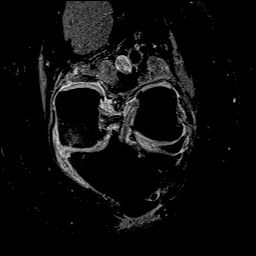
[im 13/19]
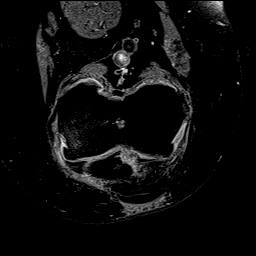
[im 19/19]
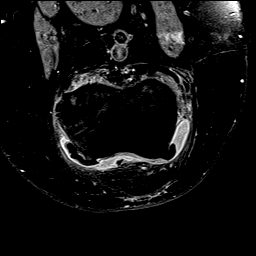

[40 of 40 positions shown; findings below may reference images not displayed]

FINDINGS: MENISCI

Medial meniscus: Marked intrasubstance degeneration with extensive
complex tearing including a prominent radial tear of the midbody
(series 12, images 7-8; series 7, image 20). Irregular horizontal
and oblique tears also involve the posterior horn.

Lateral meniscus: Mild intrasubstance degeneration without focal
tear.

LIGAMENTS

Cruciates:  Intact ACL and PCL.

Collaterals: MCL is medially bowed by extruded meniscal tissue and
medial compartment osteophytosis. Mild periligamentous edema
associated with the MCL without discrete tear. Lateral collateral
ligament complex intact.

CARTILAGE

Patellofemoral: Moderate to severe chondral loss of the
patellofemoral compartment.

Medial: Extensive full-thickness cartilage loss involving the
weight-bearing medial compartment with prominent subchondral marrow
signal changes.

Lateral:  Mild chondral thinning without focal defect.

Joint: Moderate to large knee joint effusion. 8 mm loose body within
the intercondylar notch (series 11, image 19).

Popliteal Fossa: Small complex Baker's cyst. Intact popliteus
tendon.

Extensor Mechanism: Intact quadriceps tendon and patellar tendon.
Mild distal patellar tendinosis.

Bones: The medial aspect of the inferior patella is truncated
suggesting sequela of remote trauma (series 7, image 15). There is
no marrow edema within the patella or lateral femoral condyle.
Degenerative subchondral marrow signal changes within the medial
compartment. Tricompartmental joint space narrowing with prominent
marginal osteophyte formation. No evidence of acute fracture. No
dislocation. TT-TG distance of 11 mm.

Other: Mild prepatellar subcutaneous edema.
IMPRESSION: 1. Advanced tricompartmental osteoarthritis with extensive
full-thickness cartilage loss involving the weight-bearing medial
compartment.
2. Marked intrasubstance degeneration with extensive complex tearing
of the medial meniscus including a prominent radial tear of the
midbody.
3. Moderate to large knee joint effusion with 8 mm loose body within
the intercondylar notch.
4. Small complex Baker's cyst.
5. Truncated morphology medial aspect of the inferior patella
suggesting sequela of remote trauma, possibly from prior patellar
dislocation. No marrow edema within the patella or lateral femoral
condyle to suggest a recent impaction injury.

## 2020-09-01 NOTE — Progress Notes (Signed)
Office Visit Note  Patient: Maria Bishop             Date of Birth: 02/12/1960           MRN: [id number]             PCP: [name], MD Referring: [name], MD Visit Date: 09/15/2020 Occupation: '@GUAROCC'$ @  Subjective:  Pain in multiple joints.   History of Present Illness: Maria Bishop is a 60 y.o. female returns today after her last visit couple of years ago.  She states she had left total knee replacement in August 2021 by Dr. Erlinda Hong.  She had good results and does not have much discomfort in her left knee.  Right knee joint continues to hurt.  She states she has been having increased pain and discomfort in her bilateral hands.  She also has been experiencing some tingling in her hands and feet.  She gives history of lower back pain radiating to her right lower extremity for the last 5 months.  Activities of Daily Living:  Patient reports morning stiffness for all day. Patient Denies nocturnal pain.  Difficulty dressing/grooming: Reports Difficulty climbing stairs: Reports Difficulty getting out of chair: Reports Difficulty using hands for taps, buttons, cutlery, and/or writing: Reports  Review of Systems  Constitutional:  Positive for fatigue.  HENT:  Positive for mouth dryness. Negative for mouth sores and nose dryness.   Eyes:  Negative for pain, itching and dryness.  Respiratory:  Negative for shortness of breath and difficulty breathing.   Cardiovascular:  Positive for palpitations. Negative for chest pain.  Gastrointestinal:  Negative for blood in stool, constipation and diarrhea.  Endocrine: Negative for increased urination.  Genitourinary:  Negative for difficulty urinating.  Musculoskeletal:  Positive for joint pain, joint pain, myalgias, morning stiffness and myalgias. Negative for joint swelling and muscle tenderness.  Skin:  Negative for color change, rash, redness and sensitivity to sunlight.  Allergic/Immunologic: Negative for  susceptible to infections.  Neurological:  Positive for dizziness, numbness, headaches, parasthesias, memory loss and weakness.  Hematological:  Positive for bruising/bleeding tendency. Negative for swollen glands.  Psychiatric/Behavioral:  Positive for depressed mood. The patient is nervous/anxious.    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   SVT (supraventricular tachycardia) ([place]) 11/27/2019   Anemia 11/27/2019   Pure hypercholesterolemia 11/27/2019   Tachycardia 10/29/2019   Status post total left knee replacement 10/01/2019   Binge eating disorder 08/20/2019   Primary osteoarthritis of left knee 07/30/2019   Morbid obesity ([place]) 03/10/2019   Primary osteoarthritis of both knees 03/27/2018   Primary osteoarthritis of both hands 03/27/2018   DDD (degenerative disc disease), lumbar 03/27/2018   DDD (degenerative disc disease), cervical 03/27/2018   Vitamin D deficiency 01/24/2018   Chest pain 01/25/2016   Lumbar radiculopathy 08/06/2015   Memory loss 12/02/2014   History of cervical cancer 12/02/2014   History of MI (myocardial infarction) 12/02/2014   Insomnia 08/31/2014   GERD (gastroesophageal reflux disease) 07/08/2013   Abdominal pain, epigastric 07/08/2013   Screening for malignant neoplasm of cervix 07/19/2012   Routine general medical examination at a health care facility 07/19/2012   Depression with anxiety 06/06/2012   Hyperlipidemia 09/06/2011   Coronary artery disease involving native coronary artery of native heart without angina pectoris 06/24/2001    Past Medical History:  Diagnosis Date   Anxiety  CAD (coronary artery disease)    a. NSTEMI 2013: likely spontaneous dissection/ruptured plaque in OM3 - PCI: 3 overlapping Promus DES to OM3, EF 55% b. cath 2014: patent stents   Cervical cancer ([place])    Cervical cancer    Degenerative disc disease    Depression    GERD (gastroesophageal reflux disease)    Headache    Heart murmur    MRSA infection    Myocardial infarction ([place])    ~39yr ago   NSTEMI (non-ST elevated myocardial infarction) [place] 06/24/2011   a. NSTEMI 2013: likely spontaneous dissection/ruptured plaque in OM3 - PCI: 3 overlapping Promus DES to OM3, EF 55%    Pneumonia    As a child    Family History  Problem Relation Age of Onset   Alzheimer's disease Maternal Uncle    Coronary artery disease Mother        MI in her 466s  Hypertension Mother    Early death Father    Brain cancer Sister    Drug abuse Son    Asthma Daughter    Anxiety disorder Daughter    Breast cancer Maternal Grandmother    Past Surgical History:  Procedure Laterality Date   APPENDECTOMY     [place] bunionectomy right  February 2015   CARDIAC CATHETERIZATION N/A 01/26/2016   Procedure: Left Heart Cath and Coronary Angiography;  Surgeon: HBelva Crome MD;  [facility];  Service: Cardiovascular;  Laterality: N/A;   CERVICAL BIOPSY     JOINT REPLACEMENT  09/2019   total left knee replacement   LEFT HEART CATHETERIZATION WITH CORONARY ANGIOGRAM N/A 06/24/2011   Procedure: LEFT HEART CATHETERIZATION WITH CORONARY ANGIOGRAM;  Surgeon: JJettie Booze MD;  [facility];  Service: Cardiovascular;  Laterality: N/A;   PERCUTANEOUS CORONARY STENT INTERVENTION (PCI-S) Right 06/24/2011   Procedure: PERCUTANEOUS CORONARY STENT INTERVENTION (PCI-S);  Surgeon: JJettie Booze MD;  [facility];  Service: Cardiovascular;  Laterality: Right;   TOTAL KNEE ARTHROPLASTY Left 10/01/2019   Procedure: LEFT TOTAL KNEE ARTHROPLASTY;  Surgeon: XLeandrew Koyanagi MD;  Location: [facility];  Service: Orthopedics;  Laterality: Left;   TUBAL LIGATION     WISDOM TOOTH EXTRACTION     Social History   Social History Narrative   Right handed    Immunization History  Administered Date(s) Administered    Influenza,inj,Quad PF,6+ Mos 01/24/2018, 02/05/2019   PFIZER(Purple Top)SARS-COV-2 Vaccination 04/07/2019, 04/28/2019   Tdap 06/06/2014     Objective: Vital Signs: BP 125/86 (BP Location: Left Arm, Patient Position: Sitting, Cuff Size: Normal)   Pulse (!) 109   Ht '5\' 2"'$  (1.575 m)   Wt 206 lb (93.4 kg)   LMP 07/05/2012   BMI 37.68 kg/m    Physical Exam Vitals and nursing note reviewed.  Constitutional:      Appearance: She is well-developed.  HENT:     Head: Normocephalic and atraumatic.  Eyes:     Conjunctiva/sclera: Conjunctivae normal.  Cardiovascular:     Rate and Rhythm: Normal rate and regular rhythm.     Heart sounds: Normal heart sounds.  Pulmonary:     Effort: Pulmonary effort is normal.     Breath sounds: Normal breath sounds.  Abdominal:     General: Bowel sounds are normal.     Palpations: Abdomen is soft.  Musculoskeletal:     Cervical back: Normal range of motion.  Lymphadenopathy:     Cervical: No cervical adenopathy.  Skin:  General: Skin is warm and dry.     Capillary Refill: Capillary refill takes less than 2 seconds.  Neurological:     Mental Status: She is alert and oriented to person, place, and time.  Psychiatric:        Behavior: Behavior normal.     Musculoskeletal Exam: C-spine was in good range of motion with some crepitus.  She had discomfort range of motion of her lumbar spine.  There was no point tenderness.  Shoulder joints, elbow joints, wrist joints with good range of motion.  She had bilateral CMC PIP and DIP thickening with no synovitis.  Hip joints with good range of motion.  Left knee joint is replaced with full extension.  Right knee joint had limited extension without any warmth or swelling.  There was no tenderness over ankles or MTPs.  CDAI Exam: CDAI Score: -- Patient Global: --; Provider Global: -- Swollen: --; Tender: -- Joint Exam 09/15/2020   No joint exam has been documented for this visit   There is currently no  information documented on the homunculus. Go to the Rheumatology activity and complete the homunculus joint exam.  Investigation: No additional findings.  Imaging: No results found.  Recent Labs: Lab Results  Component Value Date   WBC 3.7 (L) 06/28/2020   HGB 13.6 06/28/2020   PLT 255.0 06/28/2020   NA 139 06/28/2020   K 4.3 06/28/2020   CL 104 06/28/2020   CO2 25 06/28/2020   GLUCOSE 90 06/28/2020   BUN 17 06/28/2020   CREATININE 0.69 06/28/2020   BILITOT 0.6 06/28/2020   ALKPHOS 82 06/28/2020   AST 15 06/28/2020   ALT 12 06/28/2020   PROT 6.7 06/28/2020   ALBUMIN 4.4 06/28/2020   CALCIUM 9.2 06/28/2020   GFRAA >60 10/02/2019    Speciality Comments: No specialty comments available.  Procedures:  No procedures performed Allergies: Cefdinir, Statins, Zetia [ezetimibe], Mobic [meloxicam], and Shellfish allergy   Assessment / Plan:     Visit Diagnoses: DDD (degenerative disc disease), cervical-she has good range of motion without radiculopathy.  She had some crepitus.  DDD (degenerative disc disease), lumbar - Mild spondylosis and facet joint arthropathy: She continues to have some lower back pain and discomfort.  She gives history of intermittent radiculopathy to the right lower extremity.  I gave her a handout on back exercises.  I also offered physical therapy.  She will be moving to [place] area.  I advised her to get physical therapy there.  Primary osteoarthritis of both hands-he continues to have pain and stiffness in her bilateral hands.  No warmth swelling or effusion was noted.  She had bilateral CMC PIP and DIP thickening.  I gave a prescription for right CMC brace.  A handout on hand exercises was given.  Primary osteoarthritis of both knees -she continues to have some discomfort in her right knee joint with some limitation with extension.  Status post left total knee replacement-she had left total knee replacement by Dr. Erlinda Hong in August 2021.  She had  excellent results.  Positive ANA (antinuclear antibody) - ANA 1: 320 homogeneous, ENA negative, C3-C4 normal.  She denies any history of oral ulcers, nasal ulcers, malar rash, photosensitivity, Raynaud's phenomenon.  She has mild sicca symptoms.  No further work-up is needed at this point.  Advised her to contact me in case she develops new symptoms.  Other medical symptoms are listed as follows:  History of MRSA infection  Depression with anxiety  History of cervical  cancer  History of hyperlipidemia  History of gastroesophageal reflux (GERD)  Vitamin D deficiency  History of coronary artery disease  Orders: No orders of the defined types were placed in this encounter.  No orders of the defined types were placed in this encounter.    Follow-Up Instructions: Return if symptoms worsen or fail to improve, for Osteoarthritis.   Bo Merino, MD  Note - This record has been created using Editor, commissioning.  Chart creation errors have been sought, but may not always  have been located. Such creation errors do not reflect on  the standard of medical care.

## 2020-09-15 ENCOUNTER — Encounter: Payer: Self-pay | Admitting: Rheumatology

## 2020-09-15 ENCOUNTER — Ambulatory Visit: Payer: 59 | Admitting: Rheumatology

## 2020-09-15 ENCOUNTER — Other Ambulatory Visit: Payer: Self-pay

## 2020-09-15 VITALS — BP 125/86 | HR 109 | Ht 62.0 in | Wt 206.0 lb

## 2020-09-15 DIAGNOSIS — M19042 Primary osteoarthritis, left hand: Secondary | ICD-10-CM

## 2020-09-15 DIAGNOSIS — Z8541 Personal history of malignant neoplasm of cervix uteri: Secondary | ICD-10-CM

## 2020-09-15 DIAGNOSIS — Z96652 Presence of left artificial knee joint: Secondary | ICD-10-CM

## 2020-09-15 DIAGNOSIS — M170 Bilateral primary osteoarthritis of knee: Secondary | ICD-10-CM

## 2020-09-15 DIAGNOSIS — M19041 Primary osteoarthritis, right hand: Secondary | ICD-10-CM | POA: Diagnosis not present

## 2020-09-15 DIAGNOSIS — Z8639 Personal history of other endocrine, nutritional and metabolic disease: Secondary | ICD-10-CM

## 2020-09-15 DIAGNOSIS — R768 Other specified abnormal immunological findings in serum: Secondary | ICD-10-CM

## 2020-09-15 DIAGNOSIS — M5136 Other intervertebral disc degeneration, lumbar region: Secondary | ICD-10-CM | POA: Diagnosis not present

## 2020-09-15 DIAGNOSIS — F418 Other specified anxiety disorders: Secondary | ICD-10-CM

## 2020-09-15 DIAGNOSIS — Z8614 Personal history of Methicillin resistant Staphylococcus aureus infection: Secondary | ICD-10-CM

## 2020-09-15 DIAGNOSIS — E559 Vitamin D deficiency, unspecified: Secondary | ICD-10-CM

## 2020-09-15 DIAGNOSIS — M5030 Other cervical disc degeneration, unspecified cervical region: Secondary | ICD-10-CM

## 2020-09-15 DIAGNOSIS — M1711 Unilateral primary osteoarthritis, right knee: Secondary | ICD-10-CM

## 2020-09-15 DIAGNOSIS — Z8719 Personal history of other diseases of the digestive system: Secondary | ICD-10-CM

## 2020-09-15 DIAGNOSIS — Z8679 Personal history of other diseases of the circulatory system: Secondary | ICD-10-CM

## 2020-09-15 NOTE — Patient Instructions (Signed)
Back Exercises The following exercises strengthen the muscles that help to support the trunk and back. They also help to keep the lower back flexible. Doing these exercises can help to prevent back pain or lessen existing pain. If you have back pain or discomfort, try doing these exercises 2-3 times each day or as told by your health care provider. As your pain improves, do them once each day, but increase the number of times that you repeat the steps for each exercise (do more repetitions). To prevent the recurrence of back pain, continue to do these exercises once each day or as told by your health care provider. Do exercises exactly as told by your health care provider and adjust them as directed. It is normal to feel mild stretching, pulling, tightness, or discomfort as you do these exercises, but you should stop right away if youfeel sudden pain or your pain gets worse. Exercises Single knee to chest Repeat these steps 3-5 times for each leg: Lie on your back on a firm bed or the floor with your legs extended. Bring one knee to your chest. Your other leg should stay extended and in contact with the floor. Hold your knee in place by grabbing your knee or thigh with both hands and hold. Pull on your knee until you feel a gentle stretch in your lower back or buttocks. Hold the stretch for 10-30 seconds. Slowly release and straighten your leg. Pelvic tilt Repeat these steps 5-10 times: Lie on your back on a firm bed or the floor with your legs extended. Bend your knees so they are pointing toward the ceiling and your feet are flat on the floor. Tighten your lower abdominal muscles to press your lower back against the floor. This motion will tilt your pelvis so your tailbone points up toward the ceiling instead of pointing to your feet or the floor. With gentle tension and even breathing, hold this position for 5-10 seconds. Cat-cow Repeat these steps until your lower back becomes more  flexible: Get into a hands-and-knees position on a firm surface. Keep your hands under your shoulders, and keep your knees under your hips. You may place padding under your knees for comfort. Let your head hang down toward your chest. Contract your abdominal muscles and point your tailbone toward the floor so your lower back becomes rounded like the back of a cat. Hold this position for 5 seconds. Slowly lift your head, let your abdominal muscles relax and point your tailbone up toward the ceiling so your back forms a sagging arch like the back of a cow. Hold this position for 5 seconds.  Press-ups Repeat these steps 5-10 times: Lie on your abdomen (face-down) on the floor. Place your palms near your head, about shoulder-width apart. Keeping your back as relaxed as possible and keeping your hips on the floor, slowly straighten your arms to raise the top half of your body and lift your shoulders. Do not use your back muscles to raise your upper torso. You may adjust the placement of your hands to make yourself more comfortable. Hold this position for 5 seconds while you keep your back relaxed. Slowly return to lying flat on the floor.  Bridges Repeat these steps 10 times: Lie on your back on a firm surface. Bend your knees so they are pointing toward the ceiling and your feet are flat on the floor. Your arms should be flat at your sides, next to your body. Tighten your buttocks muscles and lift your   buttocks off the floor until your waist is at almost the same height as your knees. You should feel the muscles working in your buttocks and the back of your thighs. If you do not feel these muscles, slide your feet 1-2 inches farther away from your buttocks. Hold this position for 3-5 seconds. Slowly lower your hips to the starting position, and allow your buttocks muscles to relax completely. If this exercise is too easy, try doing it with your arms crossed over yourchest. Abdominal  crunches Repeat these steps 5-10 times: Lie on your back on a firm bed or the floor with your legs extended. Bend your knees so they are pointing toward the ceiling and your feet are flat on the floor. Cross your arms over your chest. Tip your chin slightly toward your chest without bending your neck. Tighten your abdominal muscles and slowly raise your trunk (torso) high enough to lift your shoulder blades a tiny bit off the floor. Avoid raising your torso higher than that because it can put too much stress on your low back and does not help to strengthen your abdominal muscles. Slowly return to your starting position. Back lifts Repeat these steps 5-10 times: Lie on your abdomen (face-down) with your arms at your sides, and rest your forehead on the floor. Tighten the muscles in your legs and your buttocks. Slowly lift your chest off the floor while you keep your hips pressed to the floor. Keep the back of your head in line with the curve in your back. Your eyes should be looking at the floor. Hold this position for 3-5 seconds. Slowly return to your starting position. Contact a health care provider if: Your back pain or discomfort gets much worse when you do an exercise. Your worsening back pain or discomfort does not lessen within 2 hours after you exercise. If you have any of these problems, stop doing these exercises right away. Do not do them again unless your health care provider says that you can. Get help right away if: You develop sudden, severe back pain. If this happens, stop doing the exercises right away. Do not do them again unless your health care provider says that you can. This information is not intended to replace advice given to you by your health care provider. Make sure you discuss any questions you have with your healthcare provider. Document Revised: 05/30/2018 Document Reviewed: 10/25/2017 Elsevier Patient Education  [place] Exercises Hand  exercises can be helpful for almost anyone. These exercises can strengthen the hands, improve flexibility and movement, and increase blood flow to the hands. These results can make work and daily tasks easier. Hand exercises can be especially helpful for people who have joint pain from arthritis or have nerve damage from overuse (carpal tunnel syndrome). These exercises can also help people who have injured a hand. Exercises Most of these hand exercises are gentle stretching and motion exercises. It is usually safe to do them often throughout the day. Warming up your hands before exercise may help to reduce stiffness. You can do this with gentle massage orby placing your hands in warm water for 10-15 minutes. It is normal to feel some stretching, pulling, tightness, or mild discomfort as you begin new exercises. This will gradually improve. Stop an exercise right away if you feel sudden, severe pain or your pain gets worse. Ask your healthcare provider which exercises are best for you. Knuckle bend or "claw" fist Stand or sit with your  arm, hand, and all five fingers pointed straight up. Make sure to keep your wrist straight during the exercise. Gently bend your fingers down toward your palm until the tips of your fingers are touching the top of your palm. Keep your big knuckle straight and just bend the small knuckles in your fingers. Hold this position for __________ seconds. Straighten (extend) your fingers back to the starting position. Repeat this exercise 5-10 times with each hand. Full finger fist Stand or sit with your arm, hand, and all five fingers pointed straight up. Make sure to keep your wrist straight during the exercise. Gently bend your fingers into your palm until the tips of your fingers are touching the middle of your palm. Hold this position for __________ seconds. Extend your fingers back to the starting position, stretching every joint fully. Repeat this exercise 5-10 times with  each hand. Straight fist Stand or sit with your arm, hand, and all five fingers pointed straight up. Make sure to keep your wrist straight during the exercise. Gently bend your fingers at the big knuckle, where your fingers meet your hand, and the middle knuckle. Keep the knuckle at the tips of your fingers straight and try to touch the bottom of your palm. Hold this position for __________ seconds. Extend your fingers back to the starting position, stretching every joint fully. Repeat this exercise 5-10 times with each hand. Tabletop Stand or sit with your arm, hand, and all five fingers pointed straight up. Make sure to keep your wrist straight during the exercise. Gently bend your fingers at the big knuckle, where your fingers meet your hand, as far down as you can while keeping the small knuckles in your fingers straight. Think of forming a tabletop with your fingers. Hold this position for __________ seconds. Extend your fingers back to the starting position, stretching every joint fully. Repeat this exercise 5-10 times with each hand. Finger spread Place your hand flat on a table with your palm facing down. Make sure your wrist stays straight as you do this exercise. Spread your fingers and thumb apart from each other as far as you can until you feel a gentle stretch. Hold this position for __________ seconds. Bring your fingers and thumb tight together again. Hold this position for __________ seconds. Repeat this exercise 5-10 times with each hand. Making circles Stand or sit with your arm, hand, and all five fingers pointed straight up. Make sure to keep your wrist straight during the exercise. Make a circle by touching the tip of your thumb to the tip of your index finger. Hold for __________ seconds. Then open your hand wide. Repeat this motion with your thumb and each finger on your hand. Repeat this exercise 5-10 times with each hand. Thumb motion Sit with your forearm resting on  a table and your wrist straight. Your thumb should be facing up toward the ceiling. Keep your fingers relaxed as you move your thumb. Lift your thumb up as high as you can toward the ceiling. Hold for __________ seconds. Bend your thumb across your palm as far as you can, reaching the tip of your thumb for the small finger (pinkie) side of your palm. Hold for __________ seconds. Repeat this exercise 5-10 times with each hand. Grip strengthening  Hold a stress ball or other soft ball in the middle of your hand. Slowly increase the pressure, squeezing the ball as much as you can without causing pain. Think of bringing the tips of your fingers  into the middle of your palm. All of your finger joints should bend when doing this exercise. Hold your squeeze for __________ seconds, then relax. Repeat this exercise 5-10 times with each hand. Contact a health care provider if: Your hand pain or discomfort gets much worse when you do an exercise. Your hand pain or discomfort does not improve within 2 hours after you exercise. If you have any of these problems, stop doing these exercises right away. Do not do them again unless your health care provider says that you can. Get help right away if: You develop sudden, severe hand pain or swelling. If this happens, stop doing these exercises right away. Do not do them again unless your health care provider says that you can. This information is not intended to replace advice given to you by your health care provider. Make sure you discuss any questions you have with your healthcare provider. Document Revised: 05/16/2018 Document Reviewed: 01/24/2018 Elsevier Patient Education  [place].

## 2020-09-21 ENCOUNTER — Ambulatory Visit: Payer: 59 | Admitting: Orthopaedic Surgery

## 2020-09-21 ENCOUNTER — Other Ambulatory Visit: Payer: Self-pay | Admitting: Family Medicine

## 2020-09-21 NOTE — Telephone Encounter (Signed)
Tramadol LFD 08/06/20 #30 with no refills Cyclobenzaprine LFD 08/24/20 #30 with no refills LOV 06/28/20 NOV none

## 2020-09-24 ENCOUNTER — Ambulatory Visit: Payer: 59 | Admitting: Orthopaedic Surgery

## 2020-09-28 ENCOUNTER — Ambulatory Visit: Payer: 59 | Admitting: Rheumatology

## 2020-09-29 ENCOUNTER — Ambulatory Visit (INDEPENDENT_AMBULATORY_CARE_PROVIDER_SITE_OTHER): Payer: 59 | Admitting: Orthopaedic Surgery

## 2020-09-29 ENCOUNTER — Encounter: Payer: Self-pay | Admitting: Orthopaedic Surgery

## 2020-09-29 ENCOUNTER — Other Ambulatory Visit: Payer: Self-pay

## 2020-09-29 DIAGNOSIS — Z96652 Presence of left artificial knee joint: Secondary | ICD-10-CM

## 2020-09-29 NOTE — Progress Notes (Signed)
Post-Op Visit Note   Patient: Maria Bishop           Date of Birth: 07/11/1960           MRN: [id number] Visit Date: 09/29/2020 PCP: [name], MD   Assessment & Plan:  Chief Complaint:  Chief Complaint  Patient presents with   Left Knee - Follow-up   Visit Diagnoses:  1. Hx of total knee replacement, left     Plan: Patient is a pleasant 60 year old female who comes in today 1 year out left total knee replacement 10/01/2019.  She has been doing well over the past few months.  She does note some discomfort towards the end of the day for which she has been using Kinesio tape, ice and NSAIDs which has significantly helped.  Overall, she feels as though she has improved quite a bit.  Examination of the left knee reveals range of motion from 0 to 125 degrees.  She is stable valgus varus stress.  She is neurovascular intact distally.  At this point, she will continue to advance with activity as tolerated.  Dental prophylaxis reinforced.  Follow-up with [place] in 1 year for repeat evaluation and 2 view x-rays of the left knee.  She does note that she is moving to [place] [place] next week but plans on keeping her doctors here in town for the near future.  Follow-Up Instructions: Return in about 1 year (around 09/29/2021).   Orders:  No orders of the defined types were placed in this encounter.  No orders of the defined types were placed in this encounter.   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/01/2020   SVT (supraventricular tachycardia) ([place]) 11/27/2019   Anemia 11/27/2019   Pure hypercholesterolemia 11/27/2019   Tachycardia 10/29/2019   Status post total left knee replacement 10/01/2019   Binge eating disorder 08/20/2019   Primary osteoarthritis of left knee 07/30/2019   Morbid obesity ([place]) 03/10/2019   Primary  osteoarthritis of both knees 03/27/2018   Primary osteoarthritis of both hands 03/27/2018   DDD (degenerative disc disease), lumbar 03/27/2018   DDD (degenerative disc disease), cervical 03/27/2018   Vitamin D deficiency 01/24/2018   Chest pain 01/25/2016   Lumbar radiculopathy 08/06/2015   Memory loss 12/02/2014   History of cervical cancer 12/02/2014   History of MI (myocardial infarction) 12/02/2014   Insomnia 08/31/2014   GERD (gastroesophageal reflux disease) 07/08/2013   Abdominal pain, epigastric 07/08/2013   Screening for malignant neoplasm of cervix 07/19/2012   Routine general medical examination at a health care facility 07/19/2012   Depression with anxiety 06/06/2012   Hyperlipidemia 09/06/2011   Coronary artery disease involving native coronary artery of native heart without angina pectoris 06/24/2001   Past Medical History:  Diagnosis Date   Anxiety    CAD (coronary artery disease)    a. NSTEMI 2013: likely spontaneous dissection/ruptured plaque in OM3 - PCI: 3 overlapping Promus DES to OM3, EF 55% b. cath 2014: patent stents   Cervical cancer ([name])    Cervical cancer   Degenerative disc disease    Depression    GERD (gastroesophageal reflux disease)    Headache    Heart murmur    MRSA infection    Myocardial infarction ([name])    ~68yr ago   [place]STEM[place]ST [place] 06/24/[place]   a. NSTEMI 2013:  likely spontaneous dissection/ruptured plaque in OM3 - PCI: 3 overlapping Promus DES to OM3, EF 55%    Pneumonia    As a child    Family History  Problem Relation Age of Onset   Alzheimer's disease Maternal Uncle    Coronary artery disease Mother        MI in her 66s   Hypertension Mother    Early death Father    Brain cancer Sister    Drug abuse Son    Asthma Daughter    Anxiety disorder Daughter    Breast cancer Maternal Grandmother     Past Surgical History:  Procedure Laterality Date   APPENDECTOMY     [place] bunionectomy  right  February 2015   CARDIAC CATHETERIZATION N/A 01/26/2016   Procedure: Left Heart Cath and Coronary Angiography;  Surgeon: Belva Crome, MD;  Location: [facility];  Service: Cardiovascular;  Laterality: N/A;   CERVICAL BIOPSY     JOINT REPLACEMENT  09/2019   total left knee replacement   LEFT HEART CATHETERIZATION WITH CORONARY ANGIOGRAM N/A 06/24/2011   Procedure: LEFT HEART CATHETERIZATION WITH CORONARY ANGIOGRAM;  Surgeon: Jettie Booze, MD;  Location: [facility];  Service: Cardiovascular;  Laterality: N/A;   PERCUTANEOUS CORONARY STENT INTERVENTION (PCI-S) Right 06/24/2011   Procedure: PERCUTANEOUS CORONARY STENT INTERVENTION (PCI-S);  Surgeon: Jettie Booze, MD;  Location: [facility] CATH LAB;  Service: Cardiovascular;  Laterality: Right;   TOTAL KNEE ARTHROPLASTY Left 10/01/2019   Procedure: LEFT TOTAL KNEE ARTHROPLASTY;  Surgeon: Leandrew Koyanagi, MD;  Location: [facility];  Service: Orthopedics;  Laterality: Left;   TUBAL LIGATION     WISDOM TOOTH EXTRACTION     Social History   Occupational History   Occupation: Customer service manager  Tobacco Use   Smoking status: Former    Types: Cigarettes   Smokeless tobacco: Never   Tobacco comments:    quit over 30 years ago   Vaping Use   Vaping Use: Never used  Substance and Sexual Activity   Alcohol use: Yes    Alcohol/week: 0.0 standard drinks    Comment: Occasional   Drug use: No   Sexual activity: Not Currently    Birth control/protection: Condom, Surgical, Post-menopausal    Comment: Tubal ligation

## 2020-10-05 ENCOUNTER — Encounter: Payer: Self-pay | Admitting: Family Medicine

## 2020-10-05 ENCOUNTER — Other Ambulatory Visit: Payer: Self-pay

## 2020-10-05 ENCOUNTER — Other Ambulatory Visit: Payer: Self-pay | Admitting: Family Medicine

## 2020-10-05 MED ORDER — ALPRAZOLAM 0.5 MG PO TABS: ORAL_TABLET | ORAL | 3 refills | Status: DC

## 2020-10-05 MED ORDER — FLUOXETINE HCL 40 MG PO CAPS: 40.0000 mg | ORAL_CAPSULE | Freq: Every day | ORAL | 0 refills | Status: AC

## 2020-10-05 MED ORDER — PANTOPRAZOLE SODIUM 40 MG PO TBEC: 40.0000 mg | DELAYED_RELEASE_TABLET | Freq: Every day | ORAL | 0 refills | Status: AC

## 2020-10-05 NOTE — Telephone Encounter (Signed)
Lochner, Taya  P [facility] Hello  I wanted to know if you will refill my Xanax. I'm moving next week but still want to keep you as my PCP til I get acclimated to my new surroundings. I will find a pharmacy and have them contact your office for my RX refills. I will also be getting new insurance.   I will keep you updated.  I hope all is well with your family  Avin   LFD 07/21/20 #30 with 3 refills LOV 06/28/20 NOV none

## 2020-10-14 LAB — EXTERNAL GENERIC LAB PROCEDURE: COLOGUARD: NEGATIVE

## 2020-10-20 ENCOUNTER — Encounter: Payer: Self-pay | Admitting: Family Medicine

## 2020-10-29 ENCOUNTER — Encounter: Payer: Self-pay | Admitting: Family Medicine

## 2020-10-29 ENCOUNTER — Other Ambulatory Visit: Payer: Self-pay | Admitting: Family Medicine

## 2020-10-29 MED ORDER — TRAMADOL HCL 50 MG PO TABS: 50.0000 mg | ORAL_TABLET | Freq: Three times a day (TID) | ORAL | 0 refills | Status: DC | PRN

## 2020-10-29 NOTE — Telephone Encounter (Signed)
Requesting: Tramadol Contract: none UDS: none Last Visit: 08/24/20 Next Visit: none Last Refill: 09/21/20- changing pharmacies

## 2020-11-27 ENCOUNTER — Encounter: Payer: Self-pay | Admitting: Family Medicine

## 2020-11-29 ENCOUNTER — Encounter: Payer: Self-pay | Admitting: Family Medicine

## 2020-11-29 ENCOUNTER — Other Ambulatory Visit: Payer: Self-pay

## 2020-11-29 MED ORDER — CYCLOBENZAPRINE HCL 10 MG PO TABS: 10.0000 mg | ORAL_TABLET | Freq: Three times a day (TID) | ORAL | 0 refills | Status: DC | PRN

## 2020-11-30 ENCOUNTER — Other Ambulatory Visit: Payer: Self-pay | Admitting: Family Medicine

## 2020-11-30 DIAGNOSIS — F5101 Primary insomnia: Secondary | ICD-10-CM

## 2020-12-01 NOTE — Telephone Encounter (Signed)
UTD on visits, please escribe. Thank you!

## 2020-12-21 ENCOUNTER — Other Ambulatory Visit: Payer: Self-pay | Admitting: Family Medicine

## 2020-12-21 DIAGNOSIS — F5101 Primary insomnia: Secondary | ICD-10-CM

## 2020-12-31 ENCOUNTER — Other Ambulatory Visit: Payer: Self-pay | Admitting: Family Medicine

## 2020-12-31 DIAGNOSIS — F5101 Primary insomnia: Secondary | ICD-10-CM

## 2021-01-13 ENCOUNTER — Encounter: Payer: Self-pay | Admitting: Family Medicine

## 2021-01-18 ENCOUNTER — Telehealth: Payer: Self-pay

## 2021-01-18 ENCOUNTER — Other Ambulatory Visit: Payer: Self-pay | Admitting: Family Medicine

## 2021-01-18 ENCOUNTER — Other Ambulatory Visit: Payer: Self-pay | Admitting: Family

## 2021-01-18 DIAGNOSIS — E785 Hyperlipidemia, unspecified: Secondary | ICD-10-CM

## 2021-01-18 DIAGNOSIS — M1712 Unilateral primary osteoarthritis, left knee: Secondary | ICD-10-CM

## 2021-01-18 NOTE — Telephone Encounter (Signed)
-----   Message from Ramond Dial, Dale sent at 01/18/2021  4:44 PM EST ----- Regarding: FW: Deneid Repatha Patient needs updated labs. Please call and ask her to come in for lipids. Thanks. Denial was bc labs were not recent. ----- Message ----- From: Werner Lean, MD Sent: 01/11/2021  11:12 AM EST To: Ramond Dial, RPH-CPP Subject: RE: Deneid Repatha                             It should be scanned into our system at this time.  Got it in my mailbox yesterday. ----- Message ----- From: Ramond Dial, RPH-CPP Sent: 01/11/2021   7:05 AM EST To: Werner Lean, MD Subject: RE: Deneid Repatha                             I know my CMA appealed the denial. She has been out sick. I have not seen the denial letter. Do you happen to still have it? ----- Message ----- From: Werner Lean, MD Sent: 01/10/2021  11:30 AM EST To: Ramond Dial, RPH-CPP Subject: Deneid Repatha                                 Just got the denial.  Wanted to see if you had any suggestions for how we can best help this patient.  Thanks, MAC

## 2021-01-18 NOTE — Telephone Encounter (Signed)
Lmom to schedule lipid/lft

## 2021-01-18 NOTE — Telephone Encounter (Signed)
Patient is requesting a refill of the following medications: Requested Prescriptions   Pending Prescriptions Disp Refills   ALPRAZolam (XANAX) 0.5 MG tablet [Pharmacy Med Name: ALPRAZolam 0.5 MG Oral Tablet] 30 tablet 0    Sig: Take 1 tablet by mouth twice daily as needed    Date of patient request: 01/17/21 Last office visit: 06/28/20 Date of last refill: 10/05/20 Last refill amount: 30 x3R

## 2021-02-01 ENCOUNTER — Encounter: Payer: Self-pay | Admitting: Pharmacist

## 2021-02-10 MED ORDER — REPATHA SURECLICK 140 MG/ML ~~LOC~~ SOAJ: 140.0000 mg | SUBCUTANEOUS | 11 refills | Status: AC

## 2021-02-10 NOTE — Telephone Encounter (Signed)
Received VM from [facility] that appeal had been overturned. Called and left a detailed message per DPR that appeals had been overturned and she could now fill her Repatha.

## 2021-02-10 NOTE — Addendum Note (Signed)
Addended by: Marcelle Overlie D on: 02/10/2021 08:57 AM   Modules accepted: Orders

## 2021-04-18 ENCOUNTER — Other Ambulatory Visit: Payer: Self-pay | Admitting: Family Medicine

## 2021-04-18 DIAGNOSIS — F5101 Primary insomnia: Secondary | ICD-10-CM

## 2021-04-22 ENCOUNTER — Other Ambulatory Visit: Payer: Self-pay | Admitting: Family Medicine

## 2021-04-22 DIAGNOSIS — F5101 Primary insomnia: Secondary | ICD-10-CM

## 2022-02-28 ENCOUNTER — Other Ambulatory Visit (HOSPITAL_COMMUNITY): Payer: Self-pay

## 2023-08-24 ENCOUNTER — Encounter: Payer: Self-pay | Admitting: Advanced Practice Midwife

## 2024-01-07 ENCOUNTER — Encounter: Payer: Self-pay | Admitting: Internal Medicine
